# Patient Record
Sex: Female | Born: 1935 | Race: White | Hispanic: No | Smoking: Never smoker
Health system: Southern US, Community
[De-identification: ages and names within clinical notes are randomized; demographics above are authoritative.]

## PROBLEM LIST (undated history)

## (undated) DIAGNOSIS — S329XXA Fracture of unspecified parts of lumbosacral spine and pelvis, initial encounter for closed fracture: Secondary | ICD-10-CM

## (undated) DIAGNOSIS — R600 Localized edema: Secondary | ICD-10-CM

## (undated) DIAGNOSIS — M810 Age-related osteoporosis without current pathological fracture: Secondary | ICD-10-CM

## (undated) DIAGNOSIS — E119 Type 2 diabetes mellitus without complications: Secondary | ICD-10-CM

## (undated) DIAGNOSIS — R7302 Impaired glucose tolerance (oral): Secondary | ICD-10-CM

## (undated) DIAGNOSIS — M1990 Unspecified osteoarthritis, unspecified site: Secondary | ICD-10-CM

## (undated) DIAGNOSIS — R0989 Other specified symptoms and signs involving the circulatory and respiratory systems: Secondary | ICD-10-CM

## (undated) DIAGNOSIS — I5032 Chronic diastolic (congestive) heart failure: Secondary | ICD-10-CM

## (undated) DIAGNOSIS — E7800 Pure hypercholesterolemia, unspecified: Secondary | ICD-10-CM

## (undated) HISTORY — PX: CATARACT EXTRACTION: SUR2

## (undated) HISTORY — DX: Fracture of unspecified parts of lumbosacral spine and pelvis, initial encounter for closed fracture: S32.9XXA

## (undated) HISTORY — DX: Other specified symptoms and signs involving the circulatory and respiratory systems: R09.89

## (undated) HISTORY — PX: TONSILLECTOMY: SHX5217

## (undated) HISTORY — DX: Impaired glucose tolerance (oral): R73.02

## (undated) HISTORY — DX: Type 2 diabetes mellitus without complications: E11.9

## (undated) HISTORY — DX: Localized edema: R60.0

## (undated) HISTORY — PX: ABDOMINAL HYSTERECTOMY: SHX81

## (undated) HISTORY — DX: Unspecified osteoarthritis, unspecified site: M19.90

## (undated) HISTORY — DX: Age-related osteoporosis without current pathological fracture: M81.0

## (undated) HISTORY — DX: Chronic diastolic (congestive) heart failure: I50.32

## (undated) HISTORY — DX: Pure hypercholesterolemia, unspecified: E78.00

---

## 1999-07-03 ENCOUNTER — Ambulatory Visit (HOSPITAL_COMMUNITY): Admission: RE | Admit: 1999-07-03 | Discharge: 1999-07-03 | Payer: Self-pay | Admitting: *Deleted

## 2004-06-05 ENCOUNTER — Encounter: Admission: RE | Admit: 2004-06-05 | Discharge: 2004-06-05 | Payer: Self-pay | Admitting: Internal Medicine

## 2005-06-28 ENCOUNTER — Ambulatory Visit: Payer: Self-pay | Admitting: Internal Medicine

## 2006-06-06 ENCOUNTER — Encounter: Admission: RE | Admit: 2006-06-06 | Discharge: 2006-06-06 | Payer: Self-pay | Admitting: Internal Medicine

## 2006-07-21 ENCOUNTER — Ambulatory Visit: Payer: Self-pay | Admitting: Internal Medicine

## 2007-06-08 ENCOUNTER — Encounter: Admission: RE | Admit: 2007-06-08 | Discharge: 2007-06-08 | Payer: Self-pay | Admitting: Internal Medicine

## 2007-07-14 DIAGNOSIS — M810 Age-related osteoporosis without current pathological fracture: Secondary | ICD-10-CM | POA: Insufficient documentation

## 2007-07-14 HISTORY — DX: Age-related osteoporosis without current pathological fracture: M81.0

## 2007-07-27 ENCOUNTER — Ambulatory Visit: Payer: Self-pay | Admitting: Internal Medicine

## 2007-07-27 DIAGNOSIS — E119 Type 2 diabetes mellitus without complications: Secondary | ICD-10-CM

## 2007-07-27 DIAGNOSIS — E7800 Pure hypercholesterolemia, unspecified: Secondary | ICD-10-CM

## 2007-07-27 HISTORY — DX: Pure hypercholesterolemia, unspecified: E78.00

## 2007-07-27 HISTORY — DX: Type 2 diabetes mellitus without complications: E11.9

## 2008-06-08 ENCOUNTER — Encounter: Admission: RE | Admit: 2008-06-08 | Discharge: 2008-06-08 | Payer: Self-pay | Admitting: Internal Medicine

## 2008-07-27 ENCOUNTER — Encounter: Payer: Self-pay | Admitting: Internal Medicine

## 2008-07-28 ENCOUNTER — Ambulatory Visit: Payer: Self-pay | Admitting: Internal Medicine

## 2008-07-28 DIAGNOSIS — M1990 Unspecified osteoarthritis, unspecified site: Secondary | ICD-10-CM | POA: Insufficient documentation

## 2008-07-28 HISTORY — DX: Unspecified osteoarthritis, unspecified site: M19.90

## 2009-06-09 ENCOUNTER — Encounter: Admission: RE | Admit: 2009-06-09 | Discharge: 2009-06-09 | Payer: Self-pay | Admitting: Internal Medicine

## 2009-08-02 ENCOUNTER — Ambulatory Visit: Payer: Self-pay | Admitting: Internal Medicine

## 2010-08-03 ENCOUNTER — Ambulatory Visit: Payer: Self-pay | Admitting: Internal Medicine

## 2010-08-03 ENCOUNTER — Encounter: Payer: Self-pay | Admitting: Internal Medicine

## 2010-08-03 LAB — CONVERTED CEMR LAB
AST: 26 units/L (ref 0–37)
Albumin: 4.4 g/dL (ref 3.5–5.2)
Alkaline Phosphatase: 66 units/L (ref 39–117)
Basophils Absolute: 0 10*3/uL (ref 0.0–0.1)
Basophils Relative: 0.6 % (ref 0.0–3.0)
CO2: 28 meq/L (ref 19–32)
Calcium: 9.1 mg/dL (ref 8.4–10.5)
Eosinophils Absolute: 0.1 10*3/uL (ref 0.0–0.7)
Glucose, Bld: 111 mg/dL — ABNORMAL HIGH (ref 70–99)
HCT: 36 % (ref 36.0–46.0)
HDL: 80.4 mg/dL (ref >39.00)
Hemoglobin: 12.5 g/dL (ref 12.0–15.0)
Lymphocytes Relative: 25.9 % (ref 12.0–46.0)
Lymphs Abs: 1.4 10*3/uL (ref 0.7–4.0)
MCHC: 34.7 g/dL (ref 30.0–36.0)
Monocytes Relative: 11.9 % (ref 3.0–12.0)
Neutro Abs: 3.3 10*3/uL (ref 1.4–7.7)
Potassium: 5.6 meq/L — ABNORMAL HIGH (ref 3.5–5.1)
RBC: 3.65 M/uL — ABNORMAL LOW (ref 3.87–5.11)
RDW: 14.9 % — ABNORMAL HIGH (ref 11.5–14.6)
Sodium: 132 meq/L — ABNORMAL LOW (ref 135–145)
TSH: 0.61 microintl units/mL (ref 0.35–5.50)
Total CHOL/HDL Ratio: 2
Total Protein: 7 g/dL (ref 6.0–8.3)

## 2010-08-06 ENCOUNTER — Telehealth: Payer: Self-pay | Admitting: Internal Medicine

## 2010-12-02 LAB — CONVERTED CEMR LAB
ALT: 15 units/L (ref 0–35)
AST: 23 units/L (ref 0–37)
AST: 25 units/L (ref 0–37)
Albumin: 4.3 g/dL (ref 3.5–5.2)
Alkaline Phosphatase: 58 units/L (ref 39–117)
BUN: 10 mg/dL (ref 6–23)
Basophils Absolute: 0 10*3/uL (ref 0.0–0.1)
Basophils Relative: 0.6 % (ref 0.0–3.0)
Basophils Relative: 0.7 % (ref 0.0–1.0)
Bilirubin Urine: NEGATIVE
CO2: 30 meq/L (ref 19–32)
CO2: 32 meq/L (ref 19–32)
Calcium: 9.2 mg/dL (ref 8.4–10.5)
Chloride: 102 meq/L (ref 96–112)
Chloride: 103 meq/L (ref 96–112)
Cholesterol: 179 mg/dL (ref 0–200)
Creatinine, Ser: 0.7 mg/dL (ref 0.4–1.2)
Creatinine, Ser: 0.8 mg/dL (ref 0.4–1.2)
Eosinophils Relative: 3.6 % (ref 0.0–5.0)
Glucose, Bld: 106 mg/dL — ABNORMAL HIGH (ref 70–99)
Glucose, Urine, Semiquant: NEGATIVE
HCT: 37.5 % (ref 36.0–46.0)
Hemoglobin: 12.1 g/dL (ref 12.0–15.0)
Hemoglobin: 13 g/dL (ref 12.0–15.0)
Hgb A1c MFr Bld: 6 % (ref 4.6–6.0)
Ketones, urine, test strip: NEGATIVE
LDL Cholesterol: 100 mg/dL — ABNORMAL HIGH (ref 0–99)
Lymphocytes Relative: 27.4 % (ref 12.0–46.0)
MCHC: 35.3 g/dL (ref 30.0–36.0)
Monocytes Absolute: 0.7 10*3/uL (ref 0.2–0.7)
Monocytes Relative: 12 % (ref 3.0–12.0)
Neutro Abs: 3.1 10*3/uL (ref 1.4–7.7)
Neutrophils Relative %: 56.4 % (ref 43.0–77.0)
Neutrophils Relative %: 61.1 % (ref 43.0–77.0)
Potassium: 4.4 meq/L (ref 3.5–5.1)
RBC: 3.46 M/uL — ABNORMAL LOW (ref 3.87–5.11)
RDW: 13.2 % (ref 11.5–14.6)
Sodium: 139 meq/L (ref 135–145)
Specific Gravity, Urine: 1.015
TSH: 0.38 microintl units/mL (ref 0.35–5.50)
Total Bilirubin: 0.8 mg/dL (ref 0.3–1.2)
Total Protein: 7 g/dL (ref 6.0–8.3)
Total Protein: 7 g/dL (ref 6.0–8.3)
WBC: 5.4 10*3/uL (ref 4.5–10.5)
WBC: 5.6 10*3/uL (ref 4.5–10.5)
pH: 7

## 2010-12-04 NOTE — Assessment & Plan Note (Signed)
Summary: emp--will fast//ccm   Vital Signs:  Patient profile:   77 year old female Height:      60 inches Weight:      138 pounds BMI:     27.05 Temp:     97.7 degrees F oral BP sitting:   120 / 70  (right arm) Cuff size:   regular  Vitals Entered By: Duard Brady LPN (August 03, 2010 8:29 AM) CC: cpx - doing well    **decline Flu Vaccine Is Patient Diabetic? No   CC:  cpx - doing well    **decline Flu Vaccine.  History of Present Illness: 77  year old patient who is seen today for a health maintenance examination.  She does remarkably well with a history of osteoporosis.  She has a history of impaired glucose tolerance.  Mild dyslipidemia, and osteoarthritis.  Here for Medicare AWV:  1.   Risk factors based on Past M, S, F history:  risk factors include a history of mild dyslipidemia, as well as her age of 77 years old.  She denies any cardiopulmonary complaints and does walk daily 2.   Physical Activities: daily exercise 3.   Depression/mood: no history of depression or mood disorder 4.   Hearing: no significant deficits 5.   ADL's: independent in all aspects of daily living; still drives locally and lives independently 6.   Fall Risk: moderate due to  her age.  She has had no falls, however 7.   Home Safety: no problems identified.  Does have a daughter, who lives by and watches over her care. 8.   Height, weight, &visual acuity:height and weight are stable.  No difficulty with visual acuity 9.   Counseling: heart healthy diet, and ongoing walking regimen encouraged 10.   Labs ordered based on risk factors: laboratory profile, including lipid panel will be reviewed.  A TSH will be reviewed 11.           Referral Coordination- not appropriate at this time 12.           Care Plan- heart healthy diet;  walking regimen encouraged 13.            Cognitive Assessment-  alert and oriented, with normal affect.  No memory deficits; handles executive functions without  difficulty   Allergies (verified): No Known Drug Allergies  Past History:  Past Medical History: Reviewed history from 07/28/2008 and no changes required. DJD High Cholesterol Osteoporosis Menopausal Syndrome  impaired glucose tolerance bilateral carotid bruits  Past Surgical History: Reviewed history from 08/02/2009 and no changes required. Dental Extraction Cataract extraction Hysterectomy Tonsillectomy gravida 5, para 4, abortus one   screening colonoscopy-none  Family History: Reviewed history from 07/28/2008 and no changes required. Family History of CAD Female 1st degree relative <50 Family History Kidney disease Family History of Prostate CA 1st degree relative <50 Family History of Suicide attempt Family History of Leukemia  father died age 47, renal cancer-history of CAD mother died age 71  Four  brothers-positive  for leukemia and prostate cancer  Social History: Reviewed history from 07/28/2008 and no changes required. Retired Single lives alone.  Three adult children in the vicinity Never Smoked Widow/Widower since 1997  Review of Systems  The patient denies anorexia, fever, weight loss, weight gain, vision loss, decreased hearing, hoarseness, chest pain, syncope, dyspnea on exertion, peripheral edema, prolonged cough, headaches, hemoptysis, abdominal pain, melena, hematochezia, severe indigestion/heartburn, hematuria, incontinence, genital sores, muscle weakness, suspicious skin lesions, transient blindness, difficulty walking,  depression, unusual weight change, abnormal bleeding, enlarged lymph nodes, angioedema, and breast masses.    Physical Exam  General:  elderly alert, no distress Head:  Normocephalic and atraumatic without obvious abnormalities. No apparent alopecia or balding. Eyes:  No corneal or conjunctival inflammation noted. EOMI. Perrla. Funduscopic exam benign, without hemorrhages, exudates or papilledema. Vision grossly normal. Ears:   External ear exam shows no significant lesions or deformities.  Otoscopic examination reveals clear canals, tympanic membranes are intact bilaterally without bulging, retraction, inflammation or discharge. Hearing is grossly normal bilaterally. Nose:  External nasal examination shows no deformity or inflammation. Nasal mucosa are pink and moist without lesions or exudates. Mouth:  Oral mucosa and oropharynx without lesions or exudates.  dentures in place. Neck:  no audible bruits noted today Chest Wall:  No deformities, masses, or tenderness noted. Breasts:  No mass, nodules, thickening, tenderness, bulging, retraction, inflamation, nipple discharge or skin changes noted.   Lungs:  Normal respiratory effort, chest expands symmetrically. Lungs are clear to auscultation, no crackles or wheezes. Heart:  Normal rate and regular rhythm. S1 and S2 normal without gallop, murmur, click, rub or other extra sounds. Abdomen:  Bowel sounds positive,abdomen soft and non-tender without masses, organomegaly or hernias noted.  abdominal and bilateral femoral bruits noted Rectal:  No external abnormalities noted. Normal sphincter tone. No rectal masses or tenderness. Genitalia:  status post hysterectomy atrophic changes only Msk:  No deformity or scoliosis noted of thoracic or lumbar spine.   Pulses:  dorsalis pedis pulses, full  posterior tibial pulse is not easily palpable Extremities:  No clubbing, cyanosis, edema, or deformity noted with normal full range of motion of all joints.   Neurologic:  No cranial nerve deficits noted. Station and gait are normal. Plantar reflexes are down-going bilaterally. DTRs are symmetrical throughout. Sensory, motor and coordinative functions appear intact. Skin:  Intact without suspicious lesions or rashes Cervical Nodes:  No lymphadenopathy noted Axillary Nodes:  No palpable lymphadenopathy Inguinal Nodes:  No significant adenopathy Psych:  Cognition and judgment appear  intact. Alert and cooperative with normal attention span and concentration. No apparent delusions, illusions, hallucinations  Diabetes Management Exam:    Foot Exam (with socks and/or shoes not present):       Sensory-Pinprick/Light touch:          Left medial foot (L-4): normal          Left dorsal foot (L-5): normal          Left lateral foot (S-1): normal          Right medial foot (L-4): normal          Right dorsal foot (L-5): normal          Right lateral foot (S-1): normal       Sensory-Monofilament:          Left foot: normal          Right foot: normal       Inspection:          Left foot: normal          Right foot: normal       Nails:          Left foot: fungal infection          Right foot: fungal infection    Foot Exam by Podiatrist:       Date: 08/03/2010       Results: no diabetic findings       Done by:  PCP    Eye Exam:       Eye Exam done here today          Results: normal   Impression & Recommendations:  Problem # 1:  PREVENTIVE HEALTH CARE (ICD-V70.0)  Orders: EKG w/ Interpretation (93000) Medicare -1st Annual Wellness Visit [phone])  Complete Medication List: 1)  Fosamax 70 Mg Tabs (Alendronate sodium) .Marland Kitchen.. 1 q week 2)  Adult Aspirin Ec Low Strength 81 Mg Tbec (Aspirin) .Marland Kitchen.. 1 once daily 3)  Multivitamins Tabs (Multiple vitamin) .... Qd  Other Orders: Venipuncture (60737) TLB-Lipid Panel (80061-LIPID) TLB-BMP (Basic Metabolic Panel-BMET) (80048-METABOL) TLB-CBC Platelet - w/Differential (85025-CBCD) TLB-Hepatic/Liver Function Pnl (80076-HEPATIC) TLB-TSH (Thyroid Stimulating Hormone) (84443-TSH) TLB-A1C / Hgb A1C (Glycohemoglobin) (83036-A1C) Specimen Handling (10626)  Patient Instructions: 1)  Please schedule a follow-up appointment in 1 year. 2)  Limit your Sodium (Salt). 3)  It is important that you exercise regularly at least 20 minutes 5 times a week. If you develop chest pain, have severe difficulty breathing, or feel very tired , stop  exercising immediately and seek medical attention. 4)  Take calcium +Vitamin D daily. Prescriptions: FOSAMAX 70 MG  TABS (ALENDRONATE SODIUM) 1 q week  #4 x 6   Entered and Authorized by:   Gordy Savers  MD   Signed by:   Gordy Savers  MD on 08/03/2010   Method used:   Print then Give to Patient   RxID:   [id number]

## 2010-12-04 NOTE — Progress Notes (Signed)
Summary: pt fell/black out on 08-03-2010  Phone Note Call from Patient Call back at Home Phone [phone]   Caller: Daughter-carolyn Call For: Gordy Savers  MD Summary of Call: Daughter stated mom felll Vedia Coffer out in dr k office 08-03-2010.Pt has broken wrist also needs bloodwork results Initial call taken by: Heron Sabins,  August 06, 2010 10:51 AM  Follow-up for Phone Call        called Follow-up by: Gordy Savers  MD,  August 06, 2010 12:47 PM    .

## 2011-03-22 NOTE — Assessment & Plan Note (Signed)
[facility]                              [facility] OFFICE NOTE   NAME:Stephanie Jenkins, Stephanie Jenkins                       MRN:          [id number]  DATE:07/21/2006                            DOB:          [birth year]    An 77 year old female seen today for comprehensive exam.   HISTORY:  Osteoporosis, mild DJD, mild hypercholesterolemia.  Also a history  of mild impaired glucose tolerance.  She has done quite well today.  She  takes Fosamax, calcium, and vitamin D supplementation.  She has had a remote  hysterectomy, tonsillectomy.  She is gravida 5, para 4, abortus 1.  She had  cataract surgery and dental extractions.  Otherwise, no surgeries.   FAMILY HISTORY:  Unchanged.  Father died of renal cancer at 54.  Mother died  at 104.  Three brothers.  Positive for leukemia and prostate cancer.   PHYSICAL EXAM:  GENERAL:  Elderly white female in no acute distress.  VITAL SIGNS:  Blood pressure was 120/76.  HEENT:  Fundi, ear, nose, and throat unremarkable.  Dentures in place.  NECK:  Bilateral soft bruits.  CHEST:  Clear.  BREASTS:  Negative.  CARDIOVASCULAR:  Normal heart sounds.  No murmurs.  ABDOMEN:  Benign.  She had some bilateral soft femoral bruits.  PELVIC:  Exam revealed absent uterus.  No adnexal masses.  EXTREMITIES:  No edema.  Dorsalis pedis pulses were full.  Dorsalis pedis  pulses were not easily palpable.  NEURO:  Negative.   IMPRESSION:  1. Osteoporosis.  2. Menopausal syndrome.  3. History of impaired glucose tolerance.   DISPOSITION:  Laboratory update will be reviewed.  Will reassess in 1 year.                                   Gordy Savers, MD   PFK/MedQ  DD:  07/21/2006  DT:  07/21/2006  Job #:  [id number]

## 2011-08-02 ENCOUNTER — Encounter: Payer: Self-pay | Admitting: Internal Medicine

## 2011-08-07 ENCOUNTER — Encounter: Payer: Self-pay | Admitting: Internal Medicine

## 2011-08-07 ENCOUNTER — Ambulatory Visit (INDEPENDENT_AMBULATORY_CARE_PROVIDER_SITE_OTHER): Payer: Self-pay | Admitting: Internal Medicine

## 2011-08-07 DIAGNOSIS — Z0000 Encounter for general adult medical examination without abnormal findings: Secondary | ICD-10-CM

## 2011-08-07 DIAGNOSIS — E7800 Pure hypercholesterolemia, unspecified: Secondary | ICD-10-CM

## 2011-08-07 DIAGNOSIS — M1990 Unspecified osteoarthritis, unspecified site: Secondary | ICD-10-CM

## 2011-08-07 DIAGNOSIS — E119 Type 2 diabetes mellitus without complications: Secondary | ICD-10-CM

## 2011-08-07 DIAGNOSIS — M810 Age-related osteoporosis without current pathological fracture: Secondary | ICD-10-CM

## 2011-08-07 LAB — COMPREHENSIVE METABOLIC PANEL
ALT: 16 U/L (ref 0–35)
AST: 25 U/L (ref 0–37)
Alkaline Phosphatase: 71 U/L (ref 39–117)
BUN: 9 mg/dL (ref 6–23)
Calcium: 9 mg/dL (ref 8.4–10.5)
Creatinine, Ser: 0.7 mg/dL (ref 0.4–1.2)
Total Bilirubin: 0.5 mg/dL (ref 0.3–1.2)

## 2011-08-07 LAB — LIPID PANEL
Cholesterol: 180 mg/dL (ref 0–200)
HDL: 71.5 mg/dL (ref >39.00)
LDL Cholesterol: 97 mg/dL (ref 0–99)
Total CHOL/HDL Ratio: 3
Triglycerides: 60 mg/dL (ref 0.0–149.0)
VLDL: 12 mg/dL (ref 0.0–40.0)

## 2011-08-07 LAB — CBC WITH DIFFERENTIAL/PLATELET
Basophils Relative: 0.6 % (ref 0.0–3.0)
Eosinophils Relative: 1.4 % (ref 0.0–5.0)
HCT: 38.2 % (ref 36.0–46.0)
Hemoglobin: 12.7 g/dL (ref 12.0–15.0)
MCV: 100.6 fl — ABNORMAL HIGH (ref 78.0–100.0)
Monocytes Absolute: 0.7 10*3/uL (ref 0.1–1.0)
Neutro Abs: 3.1 10*3/uL (ref 1.4–7.7)
Neutrophils Relative %: 55.7 % (ref 43.0–77.0)
RBC: 3.79 Mil/uL — ABNORMAL LOW (ref 3.87–5.11)
WBC: 5.5 10*3/uL (ref 4.5–10.5)

## 2011-08-07 NOTE — Progress Notes (Signed)
Subjective:    Patient ID: Stephanie Jenkins, female    DOB: October 11, 1923, 77 y.o.   MRN: [id number]  HPI  History of Present Illness:   77year old patient who is seen today for a health maintenance examination. She does remarkably well with a history of osteoporosis. She has a history of impaired glucose tolerance. Mild dyslipidemia, and osteoarthritis.   Here for Medicare AWV:   1. Risk factors based on Past M, S, F history: risk factors include a history of mild dyslipidemia, as well as her age of 77 years old. She denies any cardiopulmonary complaints and does walk daily  2. Physical Activities: daily exercise  3. Depression/mood: no history of depression or mood disorder  4. Hearing: no significant deficits  5. ADL's: independent in all aspects of daily living; still drives locally and lives independently  6. Fall Risk: moderate due to her age. She has had no falls, however  7. Home Safety: no problems identified. Does have a daughter, who lives by and watches over her care.  8. Height, weight, &visual acuity:height and weight are stable. No difficulty with visual acuity  9. Counseling: heart healthy diet, and ongoing walking regimen encouraged  10. Labs ordered based on risk factors: laboratory profile, including lipid panel will be reviewed. A TSH will be reviewed  11. Referral Coordination- not appropriate at this time  12. Care Plan- heart healthy diet; walking regimen encouraged  13. Cognitive Assessment- alert and oriented, with normal affect. No memory deficits; handles executive functions without difficulty   Allergies (verified):  No Known Drug Allergies   Past History:  Past Medical History:  Reviewed history from 07/28/2008 and no changes required.  DJD  High Cholesterol  Osteoporosis  Menopausal Syndrome  impaired glucose tolerance  bilateral carotid bruits   Past Surgical History:  Reviewed history from 08/02/2009 and no changes required.  Dental Extraction    Cataract extraction  Hysterectomy  Tonsillectomy  gravida 5, para 4, abortus one  screening colonoscopy-none   Family History:  Reviewed history from 07/28/2008 and no changes required.  Family History of CAD Female 1st degree relative <50  Family History Kidney disease  Family History of Prostate CA 1st degree relative <50  Family History of Suicide attempt  Family History of Leukemia  father died age 68, renal cancer-history of CAD  mother died age 38  Four brothers-positive for leukemia and prostate cancer   Social History:  Reviewed history from 07/28/2008 and no changes required.  Retired  Single lives alone. Three adult children in the vicinity  Never Smoked  Widow/Widower since 1997    Review of Systems  Constitutional: Negative for fever, appetite change, fatigue and unexpected weight change.  HENT: Negative for hearing loss, ear pain, nosebleeds, congestion, sore throat, mouth sores, trouble swallowing, neck stiffness, dental problem, voice change, sinus pressure and tinnitus.   Eyes: Negative for photophobia, pain, redness and visual disturbance.  Respiratory: Negative for cough, chest tightness and shortness of breath.   Cardiovascular: Negative for chest pain, palpitations and leg swelling.  Gastrointestinal: Negative for nausea, vomiting, abdominal pain, diarrhea, constipation, blood in stool, abdominal distention and rectal pain.  Genitourinary: Negative for dysuria, urgency, frequency, hematuria, flank pain, vaginal bleeding, vaginal discharge, difficulty urinating, genital sores, vaginal pain, menstrual problem and pelvic pain.  Musculoskeletal: Negative for back pain and arthralgias.  Skin: Negative for rash.  Neurological: Negative for dizziness, syncope, speech difficulty, weakness, light-headedness, numbness and headaches.  Hematological: Negative for adenopathy. Does not bruise/bleed  easily.  Psychiatric/Behavioral: Negative for suicidal ideas, behavioral  problems, self-injury, dysphoric mood and agitation. The patient is not nervous/anxious.        Objective:   Physical Exam  Constitutional: She is oriented to person, place, and time. She appears well-developed and well-nourished.  HENT:  Head: Normocephalic and atraumatic.  Right Ear: External ear normal.  Left Ear: External ear normal.  Mouth/Throat: Oropharynx is clear and moist.  Eyes: Conjunctivae and EOM are normal.  Neck: Normal range of motion. Neck supple. No JVD present. No thyromegaly present.       Faint  carotid bruits  Cardiovascular: Normal rate, regular rhythm, normal heart sounds and intact distal pulses.   No murmur heard.      Bilateral femoral bruits. Dorsalis pedis pulses full. Posterior tibial pulses not easily palpable  Pulmonary/Chest: Effort normal and breath sounds normal. She has no wheezes. She has no rales.  Abdominal: Soft. Bowel sounds are normal. She exhibits no distension and no mass. There is no tenderness. There is no rebound and no guarding.  Genitourinary: Guaiac negative stool.  Musculoskeletal: Normal range of motion. She exhibits no edema and no tenderness.  Neurological: She is alert and oriented to person, place, and time. She has normal reflexes. No cranial nerve deficit. She exhibits normal muscle tone. Coordination normal.  Skin: Skin is warm and dry. No rash noted.  Psychiatric: She has a normal mood and affect. Her behavior is normal.          Assessment & Plan:   Preventive health examination Osteoporosis. We'll discontinue Fosamax she has been on this a number of years. We'll continue calcium and vitamin D supplementation Impaired glucose tolerance. We'll check lab including a hemoglobin A1c Dyslipidemia   We'll recheck one year or as needed A flu vaccine recommended but declined

## 2011-08-07 NOTE — Patient Instructions (Signed)
Take a calcium supplement, plus 800-1200 units of vitamin D    It is important that you exercise regularly, at least 20 minutes 3 to 4 times per week.  If you develop chest pain or shortness of breath seek  medical attention.  Return in one year for follow-up   

## 2011-08-12 ENCOUNTER — Encounter: Payer: Self-pay | Admitting: Internal Medicine

## 2012-08-07 ENCOUNTER — Ambulatory Visit (INDEPENDENT_AMBULATORY_CARE_PROVIDER_SITE_OTHER): Payer: Medicare Other | Admitting: Internal Medicine

## 2012-08-07 ENCOUNTER — Encounter: Payer: Self-pay | Admitting: Internal Medicine

## 2012-08-07 VITALS — BP 108/70 | HR 82 | Temp 98.2°F | Resp 16 | Ht 60.0 in | Wt 137.0 lb

## 2012-08-07 DIAGNOSIS — M1990 Unspecified osteoarthritis, unspecified site: Secondary | ICD-10-CM

## 2012-08-07 DIAGNOSIS — M810 Age-related osteoporosis without current pathological fracture: Secondary | ICD-10-CM

## 2012-08-07 DIAGNOSIS — R7302 Impaired glucose tolerance (oral): Secondary | ICD-10-CM

## 2012-08-07 DIAGNOSIS — Z0000 Encounter for general adult medical examination without abnormal findings: Secondary | ICD-10-CM

## 2012-08-07 DIAGNOSIS — R7309 Other abnormal glucose: Secondary | ICD-10-CM

## 2012-08-07 DIAGNOSIS — Z79899 Other long term (current) drug therapy: Secondary | ICD-10-CM

## 2012-08-07 HISTORY — DX: Impaired glucose tolerance (oral): R73.02

## 2012-08-07 LAB — COMPREHENSIVE METABOLIC PANEL
Albumin: 4.2 g/dL (ref 3.5–5.2)
BUN: 10 mg/dL (ref 6–23)
CO2: 28 mEq/L (ref 19–32)
GFR: 78.77 mL/min (ref >60.00)
Glucose, Bld: 118 mg/dL — ABNORMAL HIGH (ref 70–99)
Sodium: 135 mEq/L (ref 135–145)
Total Bilirubin: 0.5 mg/dL (ref 0.3–1.2)
Total Protein: 7 g/dL (ref 6.0–8.3)

## 2012-08-07 LAB — CBC WITH DIFFERENTIAL/PLATELET
Eosinophils Relative: 1.1 % (ref 0.0–5.0)
HCT: 36 % (ref 36.0–46.0)
Monocytes Relative: 13.2 % — ABNORMAL HIGH (ref 3.0–12.0)
Neutrophils Relative %: 61.1 % (ref 43.0–77.0)
Platelets: 242 10*3/uL (ref 150.0–400.0)
WBC: 5.1 10*3/uL (ref 4.5–10.5)

## 2012-08-07 NOTE — Progress Notes (Signed)
Subjective:    Patient ID: Stephanie Jenkins, female    DOB: 24-Mar-1923, 78 y.o.   MRN: [id number]  HPI    Review of Systems     Objective:   Physical Exam        Assessment & Plan:   Subjective:    Patient ID: Stephanie Jenkins, female    DOB: 1923/06/19, 78 y.o.   MRN: [id number]  HPI  History of Present Illness:   78 year old patient who is seen today for a health maintenance examination. She does remarkably well with a history of osteoporosis. She has a history of impaired glucose tolerance. Mild dyslipidemia, and osteoarthritis.   Here for Medicare AWV:   1. Risk factors based on Past M, S, F history: risk factors include a history of mild dyslipidemia, as well as her age of 78  years old. She denies any cardiopulmonary complaints and does walk daily  2. Physical Activities: daily exercise  3. Depression/mood: no history of depression or mood disorder  4. Hearing: no significant deficits  5. ADL's: independent in all aspects of daily living; still drives locally and lives independently  6. Fall Risk: moderate due to her age. She has had no falls, however  7. Home Safety: no problems identified. Does have a daughter, who lives by and watches over her care.  8. Height, weight, &visual acuity:height and weight are stable. No difficulty with visual acuity  9. Counseling: heart healthy diet, and ongoing walking regimen encouraged  10. Labs ordered based on risk factors: laboratory profile, including lipid panel will be reviewed. A TSH will be reviewed  11. Referral Coordination- not appropriate at this time  12. Care Plan- heart healthy diet; walking regimen encouraged  13. Cognitive Assessment- alert and oriented, with normal affect. No memory deficits; handles executive functions without difficulty   Allergies (verified):  No Known Drug Allergies   Past History:  Past Medical History:  Reviewed history from 07/28/2008 and no changes required.  DJD  High Cholesterol    Osteoporosis  Menopausal Syndrome  impaired glucose tolerance  bilateral carotid bruits   Past Surgical History:  Reviewed history from 08/02/2009 and no changes required.  Dental Extraction  Cataract extraction  Hysterectomy  Tonsillectomy  gravida 5, para 4, abortus one  screening colonoscopy-none   Family History:  Reviewed history from 07/28/2008 and no changes required.  Family History of CAD Female 1st degree relative <50  Family History Kidney disease  Family History of Prostate CA 1st degree relative <50  Family History of Suicide attempt  Family History of Leukemia  father died age 27, renal cancer-history of CAD  mother died age 54  Four brothers-positive for leukemia and prostate cancer   Social History:  Reviewed history from 07/28/2008 and no changes required.  Retired  Single lives alone. Three adult children in the vicinity  Never Smoked  Widow/Widower since 1997    Review of Systems  Constitutional: Negative for fever, appetite change, fatigue and unexpected weight change.  HENT: Negative for hearing loss, ear pain, nosebleeds, congestion, sore throat, mouth sores, trouble swallowing, neck stiffness, dental problem, voice change, sinus pressure and tinnitus.   Eyes: Negative for photophobia, pain, redness and visual disturbance.  Respiratory: Negative for cough, chest tightness and shortness of breath.   Cardiovascular: Negative for chest pain, palpitations and leg swelling.  Gastrointestinal: Negative for nausea, vomiting, abdominal pain, diarrhea, constipation, blood in stool, abdominal distention and rectal pain.  Genitourinary: Negative for dysuria, urgency, frequency,  hematuria, flank pain, vaginal bleeding, vaginal discharge, difficulty urinating, genital sores, vaginal pain, menstrual problem and pelvic pain.  Musculoskeletal: Negative for back pain and arthralgias.  Skin: Negative for rash.  Neurological: Negative for dizziness, syncope, speech  difficulty, weakness, light-headedness, numbness and headaches.  Hematological: Negative for adenopathy. Does not bruise/bleed easily.  Psychiatric/Behavioral: Negative for suicidal ideas, behavioral problems, self-injury, dysphoric mood and agitation. The patient is not nervous/anxious.     Past Medical History  Diagnosis Date  . DEGENERATIVE JOINT DISEASE 07/28/2008  . DIABETES MELLITUS, TYPE II, CONTROLLED 07/27/2007  . HYPERCHOLESTEROLEMIA, MILD 07/27/2007  . OSTEOPOROSIS 07/14/2007  . Carotid bruit     bilateral    History   Social History  . Marital Status: Widowed    Spouse Name: N/A    Number of Children: N/A  . Years of Education: N/A   Occupational History  . Not on file.   Social History Main Topics  . Smoking status: Never Smoker   . Smokeless tobacco: Never Used  . Alcohol Use: No  . Drug Use: No  . Sexually Active: Not on file   Other Topics Concern  . Not on file   Social History Narrative  . No narrative on file    Past Surgical History  Procedure Date  . Cataract extraction   . Abdominal hysterectomy   . Tonsillectomy     No family history on file.  No Known Allergies  Current Outpatient Prescriptions on File Prior to Visit  Medication Sig Dispense Refill  . alendronate (FOSAMAX) 70 MG tablet Take 70 mg by mouth every 7 (seven) days.       Marland Kitchen aspirin 81 MG tablet Take 81 mg by mouth daily.        . Multiple Vitamin (MULTIVITAMIN) tablet Take 1 tablet by mouth daily.          BP 108/70  Pulse 82  Temp 98.2 F (36.8 C) (Oral)  Resp 16  Ht 5' (1.524 m)  Wt 137 lb (62.143 kg)  BMI 26.76 kg/m2  SpO2 96%        Objective:   Physical Exam  Constitutional: She is oriented to person, place, and time. She appears well-developed and well-nourished.  HENT:  Head: Normocephalic and atraumatic.  Right Ear: External ear normal.  Left Ear: External ear normal.  Mouth/Throat: Oropharynx is clear and moist.  Eyes: Conjunctivae and EOM are  normal.  Neck: Normal range of motion. Neck supple. No JVD present. No thyromegaly present.       Faint  carotid bruits  Cardiovascular: Normal rate, regular rhythm, normal heart sounds and intact distal pulses.   No murmur heard.      Bilateral femoral bruits. Dorsalis pedis pulses full. Posterior tibial pulses not easily palpable  Pulmonary/Chest: Effort normal and breath sounds normal. She has no wheezes. She has no rales.  Abdominal: Soft. Bowel sounds are normal. She exhibits no distension and no mass. There is no tenderness. There is no rebound and no guarding.  Genitourinary: Guaiac negative stool.  Musculoskeletal: Normal range of motion. She exhibits no edema and no tenderness.  Neurological: She is alert and oriented to person, place, and time. She has normal reflexes. No cranial nerve deficit. She exhibits normal muscle tone. Coordination normal.  Skin: Skin is warm and dry. No rash noted.  Psychiatric: She has a normal mood and affect. Her behavior is normal.          Assessment & Plan:   Preventive health  examination Osteoporosis.  We'll continue calcium and vitamin D supplementation Impaired glucose tolerance. We'll check lab including a hemoglobin A1c Dyslipidemia-  lipids profile has been unremarkable low risk   We'll recheck one year or as needed A flu vaccine recommended but declined

## 2012-08-07 NOTE — Patient Instructions (Signed)
Take a calcium supplement, plus [phone] units of vitamin D  Return in one year for follow-up

## 2013-08-09 ENCOUNTER — Ambulatory Visit (INDEPENDENT_AMBULATORY_CARE_PROVIDER_SITE_OTHER): Payer: Medicare Other | Admitting: Internal Medicine

## 2013-08-09 ENCOUNTER — Encounter: Payer: Self-pay | Admitting: Internal Medicine

## 2013-08-09 VITALS — BP 120/70 | HR 76 | Temp 97.9°F | Resp 20 | Ht 59.25 in | Wt 137.0 lb

## 2013-08-09 DIAGNOSIS — M810 Age-related osteoporosis without current pathological fracture: Secondary | ICD-10-CM

## 2013-08-09 DIAGNOSIS — M1990 Unspecified osteoarthritis, unspecified site: Secondary | ICD-10-CM

## 2013-08-09 DIAGNOSIS — Z0000 Encounter for general adult medical examination without abnormal findings: Secondary | ICD-10-CM

## 2013-08-09 DIAGNOSIS — R7302 Impaired glucose tolerance (oral): Secondary | ICD-10-CM

## 2013-08-09 DIAGNOSIS — E7800 Pure hypercholesterolemia, unspecified: Secondary | ICD-10-CM

## 2013-08-09 LAB — COMPREHENSIVE METABOLIC PANEL
ALT: 17 U/L (ref 0–35)
CO2: 26 mEq/L (ref 19–32)
Calcium: 9.3 mg/dL (ref 8.4–10.5)
Chloride: 98 mEq/L (ref 96–112)
GFR: 88.14 mL/min (ref >60.00)
Sodium: 134 mEq/L — ABNORMAL LOW (ref 135–145)
Total Protein: 7.3 g/dL (ref 6.0–8.3)

## 2013-08-09 LAB — CBC WITH DIFFERENTIAL/PLATELET
Basophils Absolute: 0 10*3/uL (ref 0.0–0.1)
HCT: 36.6 % (ref 36.0–46.0)
Lymphocytes Relative: 23.2 % (ref 12.0–46.0)
Lymphs Abs: 1.3 10*3/uL (ref 0.7–4.0)
Monocytes Relative: 10.2 % (ref 3.0–12.0)
Platelets: 262 10*3/uL (ref 150.0–400.0)
RDW: 14.4 % (ref 11.5–14.6)

## 2013-08-09 LAB — TSH: TSH: 0.79 u[IU]/mL (ref 0.35–5.50)

## 2013-08-09 NOTE — Progress Notes (Signed)
Patient ID: Stephanie Jenkins, female   DOB: 02-26-1923, 79 y.o.   MRN: [id number]  Subjective:    Patient ID: Stephanie Jenkins, female    DOB: 10/10/1923, 79 y.o.   MRN: [id number]  HPI see above    Review of Systems    see above Objective:   Physical Exam  Neck:  No carotid bruits noted today  Neurological:  Decreased vibratory sensation distally    See above      Assessment & Plan:   Subjective:    Patient ID: Stephanie Jenkins, female    DOB: 04/09/1923, 79 y.o.   MRN: [id number]  HPI  History of Present Illness:   79  year old patient who is seen today for a health maintenance examination. She does remarkably well with a history of osteoporosis. She has a history of impaired glucose tolerance. Mild dyslipidemia, and osteoarthritis.  She takes daily baby aspirin multivitamins only. She has been treated with Fosamax in the past. She lives independently and even does her own yard work  Here for [employer] AWV:   1. Risk factors based on Past M, S, F history: risk factors include a history of mild dyslipidemia, as well as her age of 79  years old. She denies any cardiopulmonary complaints and does walk daily  2. Physical Activities: daily exercise  3. Depression/mood: no history of depression or mood disorder  4. Hearing: no significant deficits  5. ADL's: independent in all aspects of daily living; still drives locally and lives independently  6. Fall Risk: moderate due to her age. She has had no falls, however  7. Home Safety: no problems identified. Does have a daughter, who lives by and watches over her care.  8. Height, weight, &visual acuity:height and weight are stable. No difficulty with visual acuity  9. Counseling: heart healthy diet, and ongoing walking regimen encouraged  10. Labs ordered based on risk factors: laboratory profile, including lipid panel will be reviewed. A TSH will be reviewed  11. Referral Coordination- not appropriate at this time  12. Care Plan- heart  healthy diet; walking regimen encouraged  13. Cognitive Assessment- alert and oriented, with normal affect. No memory deficits; handles executive functions without difficulty   Allergies (verified):  No Known Drug Allergies   Past History:  Past Medical History:  Reviewed history from 07/28/2008 and no changes required.  DJD  High Cholesterol  Osteoporosis  Menopausal Syndrome  impaired glucose tolerance  bilateral carotid bruits   Past Surgical History:  Reviewed history from 08/02/2009 and no changes required.  Dental Extraction  Cataract extraction  Hysterectomy  Tonsillectomy  gravida 5, para 4, abortus one  screening colonoscopy-none   Family History:  Reviewed history from 07/28/2008 and no changes required.  Family History of CAD Female 1st degree relative <50  Family History Kidney disease  Family History of Prostate CA 1st degree relative <50  Family History of Suicide attempt  Family History of Leukemia  father died age 6, renal cancer-history of CAD  mother died age 25  Four brothers-positive for leukemia and prostate cancer   Social History:  Reviewed history from 07/28/2008 and no changes required.  Retired  Single lives alone. Three adult children in the vicinity  Never Smoked  Widow/Widower since 1997    Review of Systems  Constitutional: Negative for fever, appetite change, fatigue and unexpected weight change.  HENT: Negative for hearing loss, ear pain, nosebleeds, congestion, sore throat, mouth sores, trouble swallowing, neck stiffness, dental problem,  voice change, sinus pressure and tinnitus.   Eyes: Negative for photophobia, pain, redness and visual disturbance.  Respiratory: Negative for cough, chest tightness and shortness of breath.   Cardiovascular: Negative for chest pain, palpitations and leg swelling.  Gastrointestinal: Negative for nausea, vomiting, abdominal pain, diarrhea, constipation, blood in stool, abdominal distention and rectal  pain.  Genitourinary: Negative for dysuria, urgency, frequency, hematuria, flank pain, vaginal bleeding, vaginal discharge, difficulty urinating, genital sores, vaginal pain, menstrual problem and pelvic pain.  Musculoskeletal: Negative for back pain and arthralgias.  Skin: Negative for rash.  Neurological: Negative for dizziness, syncope, speech difficulty, weakness, light-headedness, numbness and headaches.  Hematological: Negative for adenopathy. Does not bruise/bleed easily.  Psychiatric/Behavioral: Negative for suicidal ideas, behavioral problems, self-injury, dysphoric mood and agitation. The patient is not nervous/anxious.     Past Medical History  Diagnosis Date  . DEGENERATIVE JOINT DISEASE 07/28/2008  . DIABETES MELLITUS, TYPE II, CONTROLLED 07/27/2007  . HYPERCHOLESTEROLEMIA, MILD 07/27/2007  . OSTEOPOROSIS 07/14/2007  . Carotid bruit     bilateral    History   Social History  . Marital Status: Widowed    Spouse Name: N/A    Number of Children: N/A  . Years of Education: N/A   Occupational History  . Not on file.   Social History Main Topics  . Smoking status: Never Smoker   . Smokeless tobacco: Never Used  . Alcohol Use: No  . Drug Use: No  . Sexual Activity: Not on file   Other Topics Concern  . Not on file   Social History Narrative  . No narrative on file    Past Surgical History  Procedure Laterality Date  . Cataract extraction    . Abdominal hysterectomy    . Tonsillectomy      No family history on file.  No Known Allergies  Current Outpatient Prescriptions on File Prior to Visit  Medication Sig Dispense Refill  . aspirin 81 MG tablet Take 81 mg by mouth daily.        . Multiple Vitamin (MULTIVITAMIN) tablet Take 1 tablet by mouth daily.         No current facility-administered medications on file prior to visit.    BP 120/70  Pulse 76  Temp(Src) 97.9 F (36.6 C) (Oral)  Resp 20  Ht 4' 11.25" (1.505 m)  Wt 137 lb (62.143 kg)  BMI 27.44  kg/m2  SpO2 98%        Objective:   Physical Exam  Constitutional: She is oriented to person, place, and time. She appears well-developed and well-nourished.  HENT:  Head: Normocephalic and atraumatic.  Right Ear: External ear normal.  Left Ear: External ear normal.  Mouth/Throat: Oropharynx is clear and moist.  Eyes: Conjunctivae and EOM are normal.  Neck: Normal range of motion. Neck supple. No JVD present. No thyromegaly present.       Faint  carotid bruits  Cardiovascular: Normal rate, regular rhythm, normal heart sounds and intact distal pulses.   No murmur heard.      Bilateral femoral bruits. Dorsalis pedis pulses full. Posterior tibial pulses not easily palpable  Pulmonary/Chest: Effort normal and breath sounds normal. She has no wheezes. She has no rales.  Abdominal: Soft. Bowel sounds are normal. She exhibits no distension and no mass. There is no tenderness. There is no rebound and no guarding.  Genitourinary: Guaiac negative stool.  Musculoskeletal: Normal range of motion. She exhibits no edema and no tenderness.  Neurological: She is alert and oriented  to person, place, and time. She has normal reflexes. No cranial nerve deficit. She exhibits normal muscle tone. Coordination normal.  Skin: Skin is warm and dry. No rash noted.  Psychiatric: She has a normal mood and affect. Her behavior is normal.          Assessment & Plan:   Preventive health examination Osteoporosis.  We'll continue calcium and vitamin D supplementation Impaired glucose tolerance. We'll check lab including a hemoglobin A1c Dyslipidemia-  lipids profile has been unremarkable low risk   We'll recheck one year or as needed A flu vaccine recommended but declined

## 2013-08-09 NOTE — Patient Instructions (Signed)
Limit your sodium (Salt) intake    It is important that you exercise regularly, at least 20 minutes 3 to 4 times per week.  If you develop chest pain or shortness of breath seek  medical attention.  Return in one year for follow-up   

## 2014-08-10 ENCOUNTER — Ambulatory Visit (INDEPENDENT_AMBULATORY_CARE_PROVIDER_SITE_OTHER): Payer: Medicare HMO | Admitting: Internal Medicine

## 2014-08-10 ENCOUNTER — Encounter: Payer: Self-pay | Admitting: Internal Medicine

## 2014-08-10 VITALS — BP 120/80 | HR 78 | Temp 97.9°F | Resp 18 | Ht 60.0 in | Wt 140.0 lb

## 2014-08-10 DIAGNOSIS — E7800 Pure hypercholesterolemia, unspecified: Secondary | ICD-10-CM

## 2014-08-10 DIAGNOSIS — M810 Age-related osteoporosis without current pathological fracture: Secondary | ICD-10-CM

## 2014-08-10 DIAGNOSIS — M150 Primary generalized (osteo)arthritis: Secondary | ICD-10-CM

## 2014-08-10 DIAGNOSIS — R7302 Impaired glucose tolerance (oral): Secondary | ICD-10-CM

## 2014-08-10 DIAGNOSIS — M159 Polyosteoarthritis, unspecified: Secondary | ICD-10-CM

## 2014-08-10 DIAGNOSIS — Z0000 Encounter for general adult medical examination without abnormal findings: Secondary | ICD-10-CM

## 2014-08-10 LAB — COMPREHENSIVE METABOLIC PANEL
ALBUMIN: 3.9 g/dL (ref 3.5–5.2)
ALT: 18 U/L (ref 0–35)
AST: 27 U/L (ref 0–37)
Alkaline Phosphatase: 64 U/L (ref 39–117)
BILIRUBIN TOTAL: 0.4 mg/dL (ref 0.2–1.2)
BUN: 11 mg/dL (ref 6–23)
CO2: 29 meq/L (ref 19–32)
Calcium: 9.5 mg/dL (ref 8.4–10.5)
Chloride: 98 mEq/L (ref 96–112)
Creatinine, Ser: 0.7 mg/dL (ref 0.4–1.2)
GFR: 78.41 mL/min (ref >60.00)
GLUCOSE: 110 mg/dL — AB (ref 70–99)
POTASSIUM: 4.7 meq/L (ref 3.5–5.1)
SODIUM: 135 meq/L (ref 135–145)
TOTAL PROTEIN: 7.4 g/dL (ref 6.0–8.3)

## 2014-08-10 LAB — CBC WITH DIFFERENTIAL/PLATELET
BASOS PCT: 0.6 % (ref 0.0–3.0)
Basophils Absolute: 0 10*3/uL (ref 0.0–0.1)
EOS PCT: 1.6 % (ref 0.0–5.0)
Eosinophils Absolute: 0.1 10*3/uL (ref 0.0–0.7)
HCT: 36.4 % (ref 36.0–46.0)
Hemoglobin: 11.9 g/dL — ABNORMAL LOW (ref 12.0–15.0)
Lymphocytes Relative: 27.5 % (ref 12.0–46.0)
Lymphs Abs: 1.7 10*3/uL (ref 0.7–4.0)
MCHC: 32.7 g/dL (ref 30.0–36.0)
MCV: 100.2 fl — ABNORMAL HIGH (ref 78.0–100.0)
MONOS PCT: 11.9 % (ref 3.0–12.0)
Monocytes Absolute: 0.7 10*3/uL (ref 0.1–1.0)
NEUTROS PCT: 58.4 % (ref 43.0–77.0)
Neutro Abs: 3.5 10*3/uL (ref 1.4–7.7)
PLATELETS: 244 10*3/uL (ref 150.0–400.0)
RBC: 3.64 Mil/uL — AB (ref 3.87–5.11)
RDW: 14.6 % (ref 11.5–15.5)
WBC: 6 10*3/uL (ref 4.0–10.5)

## 2014-08-10 LAB — TSH: TSH: 0.45 u[IU]/mL (ref 0.35–4.50)

## 2014-08-10 NOTE — Patient Instructions (Signed)
Take a calcium supplement, plus 800-1200 units of vitamin D    It is important that you exercise regularly, at least 20 minutes 3 to 4 times per week.  If you develop chest pain or shortness of breath seek  medical attention.  Return in one year for follow-up   

## 2014-08-10 NOTE — Progress Notes (Signed)
Patient ID: Stephanie Jenkins, female   DOB: 25-Jun-1923, 80 y.o.   MRN: [id number]  Subjective:    Patient ID: Stephanie Jenkins, female    DOB: 25-Jun-1923, 80 y.o.   MRN: [id number]  HPI  see  below    Review of Systems     see below  Objective:   Physical Exam  Neck:  No carotid bruits noted today  Neurological:  Decreased vibratory sensation distally    See below      Assessment & Plan:   Subjective:    Patient ID: Stephanie Jenkins, female    DOB: 25-Jun-1923, 80 y.o.   MRN: [id number]  HPI  History of Present Illness:   80   year old patient who is seen today for a health maintenance examination.  She does remarkably well with a history of osteoporosis. She has a history of impaired glucose tolerance. Mild dyslipidemia, and osteoarthritis.  She takes daily baby aspirin multivitamins only. She has been treated with Fosamax in the past. She lives independently and even does her own yard work  Here for [employer]:   1. Risk factors based on Past M, S, F history: risk factors include a history of mild dyslipidemia, as well as her age of 80  years old. She denies any cardiopulmonary complaints and does walk daily  2. Physical Activities: daily exercise - walks 1 hour each day 3. Depression/mood: no history of depression or mood disorder  4. Hearing: no significant deficits  5. ADL's: independent in all aspects of daily living; still drives locally and lives independently  6. Fall Risk: moderate due to her age. She has had no falls, however  7. Home Safety: no problems identified. Does have a daughter, who lives by and watches over her care.  8. Height, weight, &visual acuity:height and weight are stable. No difficulty with visual acuity  9. Counseling: heart healthy diet, and ongoing walking regimen encouraged  10. Labs ordered based on risk factors: laboratory profile, including lipid panel will be reviewed. A TSH will be reviewed  11. Referral Coordination- not appropriate at  this time  12. Care Plan- heart healthy diet; walking regimen encouraged  13. Cognitive Assessment- alert and oriented, with normal affect. No memory deficits; handles executive functions without difficulty   Allergies (verified):  No Known Drug Allergies   Past History:  Past Medical History:   DJD  High Cholesterol  Osteoporosis  Menopausal Syndrome  impaired glucose tolerance  bilateral carotid bruits  (h/o) and  Past Surgical History:    Dental Extraction  Cataract extraction  Hysterectomy  Tonsillectomy  gravida 5, para 4, abortus one  screening colonoscopy-none   Family History:   Family History of CAD Female 1st degree relative <50  Family History Kidney disease  Family History of Prostate CA 1st degree relative <50  Family History of Suicide attempt  Family History of Leukemia  father died age 166, renal cancer-history of CAD  mother died age 78  Four brothers-positive for leukemia and prostate cancer   Social History:   Retired  Single lives alone. Three adult children in the vicinity  Never Smoked  Widow/Widower since 1997    Review of Systems  Constitutional: Negative for fever, appetite change, fatigue and unexpected weight change.  HENT: Negative for hearing loss, ear pain, nosebleeds, congestion, sore throat, mouth sores, trouble swallowing, neck stiffness, dental problem, voice change, sinus pressure and tinnitus.   Eyes: Negative for photophobia, pain, redness and visual disturbance.  Respiratory: Negative for cough, chest tightness and shortness of breath.   Cardiovascular: Negative for chest pain, palpitations and leg swelling.  Gastrointestinal: Negative for nausea, vomiting, abdominal pain, diarrhea, constipation, blood in stool, abdominal distention and rectal pain.  Genitourinary: Negative for dysuria, urgency, frequency, hematuria, flank pain, vaginal bleeding, vaginal discharge, difficulty urinating, genital sores, vaginal pain, menstrual  problem and pelvic pain.  Musculoskeletal: Negative for back pain and arthralgias.  Skin: Negative for rash.  Neurological: Negative for dizziness, syncope, speech difficulty, weakness, light-headedness, numbness and headaches.  Hematological: Negative for adenopathy. Does not bruise/bleed easily.  Psychiatric/Behavioral: Negative for suicidal ideas, behavioral problems, self-injury, dysphoric mood and agitation. The patient is not nervous/anxious.     Past Medical History  Diagnosis Date  . DEGENERATIVE JOINT DISEASE 07/28/2008  . DIABETES MELLITUS, TYPE II, CONTROLLED 07/27/2007  . HYPERCHOLESTEROLEMIA, MILD 07/27/2007  . OSTEOPOROSIS 07/14/2007  . Carotid bruit     bilateral    History   Social History  . Marital Status: Widowed    Spouse Name: N/A    Number of Children: N/A  . Years of Education: N/A   Occupational History  . Not on file.   Social History Main Topics  . Smoking status: Never Smoker   . Smokeless tobacco: Never Used  . Alcohol Use: No  . Drug Use: No  . Sexual Activity: Not on file   Other Topics Concern  . Not on file   Social History Narrative  . No narrative on file    Past Surgical History  Procedure Laterality Date  . Cataract extraction    . Abdominal hysterectomy    . Tonsillectomy      No family history on file.  No Known Allergies  Current Outpatient Prescriptions on File Prior to Visit  Medication Sig Dispense Refill  . aspirin 81 MG tablet Take 81 mg by mouth daily.        . Multiple Vitamin (MULTIVITAMIN) tablet Take 1 tablet by mouth daily.         No current facility-administered medications on file prior to visit.    BP 120/80  Pulse 78  Temp(Src) 97.9 F (36.6 C) (Oral)  Resp 18  Ht 5' (1.524 m)  Wt 140 lb (63.504 kg)  BMI 27.34 kg/m2  SpO2 97%        Objective:   Physical Exam  Constitutional: She is oriented to person, place, and time. She appears well-developed and well-nourished.  HENT:  Head:  Normocephalic and atraumatic.  Right Ear: External ear normal.  Left Ear: External ear normal.  Mouth/Throat: Oropharynx is clear and moist.  Eyes: Conjunctivae and EOM are normal.  Neck: Normal range of motion. Neck supple. No JVD present. No thyromegaly present.   Cardiovascular: Normal rate, regular rhythm, normal heart sounds and intact distal pulses.   No murmur heard.      Bilateral femoral bruits. Dorsalis pedis pulses full. Posterior tibial pulses not easily palpable  Pulmonary/Chest: Effort normal and breath sounds normal. She has no wheezes. She has no rales.  Abdominal: Soft. Bowel sounds are normal. She exhibits no distension and no mass. There is no tenderness. There is no rebound and no guarding.  Genitourinary: Guaiac negative stool.  Musculoskeletal: Normal range of motion. She exhibits no edema and no tenderness.  Neurological: She is alert and oriented to person, place, and time. She has normal reflexes. No cranial nerve deficit. She exhibits normal muscle tone. Coordination normal.  Skin: Skin is warm and dry. No  rash noted.  Psychiatric: She has a normal mood and affect. Her behavior is normal.          Assessment & Plan:   Preventive health examination Osteoporosis.  We'll continue calcium and vitamin D supplementation Impaired glucose tolerance. We'll check lab including a hemoglobin A1c Dyslipidemia-  lipids profile has been unremarkable low risk   We'll recheck one year or as needed A flu vaccine recommended but declined

## 2014-08-10 NOTE — Progress Notes (Signed)
Pre visit review using our clinic review tool, if applicable. No additional management support is needed unless otherwise documented below in the visit note. 

## 2015-08-14 ENCOUNTER — Ambulatory Visit (INDEPENDENT_AMBULATORY_CARE_PROVIDER_SITE_OTHER): Payer: Medicare HMO | Admitting: Internal Medicine

## 2015-08-14 ENCOUNTER — Encounter: Payer: Self-pay | Admitting: Internal Medicine

## 2015-08-14 VITALS — BP 100/64 | HR 73 | Temp 98.4°F | Resp 18 | Ht 60.0 in | Wt 146.0 lb

## 2015-08-14 DIAGNOSIS — G3184 Mild cognitive impairment, so stated: Secondary | ICD-10-CM

## 2015-08-14 DIAGNOSIS — M810 Age-related osteoporosis without current pathological fracture: Secondary | ICD-10-CM | POA: Diagnosis not present

## 2015-08-14 DIAGNOSIS — M150 Primary generalized (osteo)arthritis: Secondary | ICD-10-CM | POA: Diagnosis not present

## 2015-08-14 DIAGNOSIS — R7302 Impaired glucose tolerance (oral): Secondary | ICD-10-CM | POA: Diagnosis not present

## 2015-08-14 DIAGNOSIS — M159 Polyosteoarthritis, unspecified: Secondary | ICD-10-CM

## 2015-08-14 DIAGNOSIS — E7800 Pure hypercholesterolemia, unspecified: Secondary | ICD-10-CM

## 2015-08-14 DIAGNOSIS — Z0000 Encounter for general adult medical examination without abnormal findings: Secondary | ICD-10-CM | POA: Diagnosis not present

## 2015-08-14 LAB — LIPID PANEL
CHOLESTEROL: 189 mg/dL (ref 0–200)
HDL: 64.8 mg/dL (ref >39.00)
LDL CALC: 109 mg/dL — AB (ref 0–99)
NonHDL: 124.34
TRIGLYCERIDES: 79 mg/dL (ref 0.0–149.0)
Total CHOL/HDL Ratio: 3
VLDL: 15.8 mg/dL (ref 0.0–40.0)

## 2015-08-14 LAB — COMPREHENSIVE METABOLIC PANEL
ALBUMIN: 4.3 g/dL (ref 3.5–5.2)
ALK PHOS: 58 U/L (ref 39–117)
ALT: 16 U/L (ref 0–35)
AST: 22 U/L (ref 0–37)
BILIRUBIN TOTAL: 0.4 mg/dL (ref 0.2–1.2)
BUN: 10 mg/dL (ref 6–23)
CALCIUM: 9.6 mg/dL (ref 8.4–10.5)
CHLORIDE: 95 meq/L — AB (ref 96–112)
CO2: 30 mEq/L (ref 19–32)
CREATININE: 0.74 mg/dL (ref 0.40–1.20)
GFR: 78.23 mL/min (ref >60.00)
Glucose, Bld: 93 mg/dL (ref 70–99)
Potassium: 4.5 mEq/L (ref 3.5–5.1)
SODIUM: 132 meq/L — AB (ref 135–145)
TOTAL PROTEIN: 7.1 g/dL (ref 6.0–8.3)

## 2015-08-14 LAB — CBC WITH DIFFERENTIAL/PLATELET
BASOS ABS: 0 10*3/uL (ref 0.0–0.1)
BASOS PCT: 0.7 % (ref 0.0–3.0)
EOS ABS: 0.1 10*3/uL (ref 0.0–0.7)
Eosinophils Relative: 2.2 % (ref 0.0–5.0)
HEMATOCRIT: 37.1 % (ref 36.0–46.0)
HEMOGLOBIN: 12.3 g/dL (ref 12.0–15.0)
LYMPHS PCT: 29.9 % (ref 12.0–46.0)
Lymphs Abs: 1.6 10*3/uL (ref 0.7–4.0)
MCHC: 33.1 g/dL (ref 30.0–36.0)
MCV: 99.2 fl (ref 78.0–100.0)
MONO ABS: 0.7 10*3/uL (ref 0.1–1.0)
Monocytes Relative: 13.7 % — ABNORMAL HIGH (ref 3.0–12.0)
Neutro Abs: 2.8 10*3/uL (ref 1.4–7.7)
Neutrophils Relative %: 53.5 % (ref 43.0–77.0)
Platelets: 249 10*3/uL (ref 150.0–400.0)
RBC: 3.74 Mil/uL — AB (ref 3.87–5.11)
RDW: 14.9 % (ref 11.5–15.5)
WBC: 5.3 10*3/uL (ref 4.0–10.5)

## 2015-08-14 LAB — TSH: TSH: 0.68 u[IU]/mL (ref 0.35–4.50)

## 2015-08-14 MED ORDER — DONEPEZIL HCL 5 MG PO TABS: 5.0000 mg | ORAL_TABLET | Freq: Every day | ORAL | Status: DC

## 2015-08-14 NOTE — Progress Notes (Signed)
Patient ID: Stephanie Jenkins, female   DOB: 10-Oct-1923, 81 y.o.   MRN: [id number]  Subjective:    Patient ID: Stephanie Jenkins, female    DOB: 1923-02-01, 81 y.o.   MRN: [id number]  HPI  see  below    Review of Systems     see below  Objective:   Physical Exam  Neck:  No carotid bruits noted today  Neurological:  Decreased vibratory sensation distally  Skin:  Onychomycotic toenail changes    See below      Assessment & Plan:   Subjective:    Patient ID: Stephanie Jenkins, female    DOB: 1923-06-04, 81 y.o.   MRN: [id number]  HPI  History of Present Illness:   81   year old patient who is seen today for a health maintenance examination.  She does remarkably well with a history of osteoporosis. She has a history of impaired glucose tolerance. Mild dyslipidemia, and osteoarthritis.  She takes daily baby aspirin multivitamins only. She has been treated with Fosamax in the past. She lives independently and even does her own yard work. Accompanied by her daughter today who is concerned about some mild cognitive impairment.  She still lives independently and drives a car.  The biggest concern is she occasionally gets lost when she drives.   MMSE  (08/14/15)  30/30  Here for [employer] AWV:   1. Risk factors based on Past M, S, F history: risk factors include a history of mild dyslipidemia, as well as her age of 81  years old. She denies any cardiopulmonary complaints and does walk daily  2. Physical Activities: daily exercise - walks 1 hour each day 3. Depression/mood: no history of depression or mood disorder  4. Hearing: no significant deficits  5. ADL's: independent in all aspects of daily living; still drives locally and lives independently  6. Fall Risk: moderate due to her age 81 She has had no falls, however  7. Home Safety: no problems identified. Does have a daughter, who lives by and watches over her care.  8. Height, weight, &visual acuity:height and weight are stable. No  difficulty with visual acuity  9. Counseling: heart healthy diet, and ongoing walking regimen encouraged  10. Labs ordered based on risk factors: laboratory profile, including lipid panel will be reviewed. A TSH will be reviewed  11. Referral Coordination- not appropriate at this time  12. Care Plan- heart healthy diet; walking regimen encouraged  13. Cognitive Assessment- alert and oriented, with normal affect. No memory deficits; handles executive functions without difficulty.  Risk concerned about mild memory deficits. MMSE today, normal  Allergies (verified):  No Known Drug Allergies   Past History:  Past Medical History:   DJD  High Cholesterol  Osteoporosis  Menopausal Syndrome  impaired glucose tolerance  bilateral carotid bruits  (h/o) and  Past Surgical History:    Dental Extraction  Cataract extraction  Hysterectomy  Tonsillectomy  gravida 5, para 4, abortus one  screening colonoscopy-none   Family History:   Family History of CAD Female 1st degree relative <50  Family History Jenkins disease  Family History of Prostate CA 1st degree relative <50  Family History of Suicide attempt  Family History of Leukemia  father died age 62, renal cancer-history of CAD  mother died age 79  Four brothers-positive for leukemia and prostate cancer   Social History:   Retired  Single lives alone. Three adult children in the vicinity  Never Smoked  Widow/Widower since 1997  Review of Systems  Constitutional: Negative for fever, appetite change, fatigue and unexpected weight change.  HENT: Negative for hearing loss, ear pain, nosebleeds, congestion, sore throat, mouth sores, trouble swallowing, neck stiffness, dental problem, voice change, sinus pressure and tinnitus.   Eyes: Negative for photophobia, pain, redness and visual disturbance.  Respiratory: Negative for cough, chest tightness and shortness of breath.   Cardiovascular: Negative for chest pain, palpitations and  leg swelling.  Gastrointestinal: Negative for nausea, vomiting, abdominal pain, diarrhea, constipation, blood in stool, abdominal distention and rectal pain.  Genitourinary: Negative for dysuria, urgency, frequency, hematuria, flank pain, vaginal bleeding, vaginal discharge, difficulty urinating, genital sores, vaginal pain, menstrual problem and pelvic pain.  Musculoskeletal: Negative for back pain and arthralgias.  Skin: Negative for rash.  Neurological: Negative for dizziness, syncope, speech difficulty, weakness, light-headedness, numbness and headaches.  Hematological: Negative for adenopathy. Does not bruise/bleed easily.  Psychiatric/Behavioral: Negative for suicidal ideas, behavioral problems, self-injury, dysphoric mood and agitation. The patient is not nervous/anxious.     Past Medical History  Diagnosis Date  . DEGENERATIVE JOINT DISEASE 07/28/2008  . DIABETES MELLITUS, TYPE II, CONTROLLED 07/27/2007  . HYPERCHOLESTEROLEMIA, MILD 07/27/2007  . OSTEOPOROSIS 07/14/2007  . Carotid bruit     bilateral    Social History   Social History  . Marital Status: Widowed    Spouse Name: N/A  . Number of Children: N/A  . Years of Education: N/A   Occupational History  . Not on file.   Social History Main Topics  . Smoking status: Never Smoker   . Smokeless tobacco: Never Used  . Alcohol Use: No  . Drug Use: No  . Sexual Activity: Not on file   Other Topics Concern  . Not on file   Social History Narrative    Past Surgical History  Procedure Laterality Date  . Cataract extraction    . Abdominal hysterectomy    . Tonsillectomy      No family history on file.  No Known Allergies  Current Outpatient Prescriptions on File Prior to Visit  Medication Sig Dispense Refill  . aspirin 81 MG tablet Take 81 mg by mouth daily.      . Multiple Vitamin (MULTIVITAMIN) tablet Take 1 tablet by mouth daily.       No current facility-administered medications on file prior to visit.     BP 100/64 mmHg  Pulse 73  Temp(Src) 98.4 F (36.9 C) (Oral)  Resp 18  Ht 5' (1.524 m)  Wt 146 lb (66.225 kg)  BMI 28.51 kg/m2  SpO2 97%        Objective:   Physical Exam  Constitutional: She is oriented to person, place, and time. She appears well-developed and well-nourished.  HENT:  Head: Normocephalic and atraumatic.  Right Ear: External ear normal.  Left Ear: External ear normal.  Mouth/Throat: Oropharynx is clear and moist.  Eyes: Conjunctivae and EOM are normal.  Neck: Normal range of motion. Neck supple. No JVD present. No thyromegaly present.   Cardiovascular: Normal rate, regular rhythm, normal heart sounds and intact distal pulses.   No murmur heard.      Bilateral femoral bruits. Dorsalis pedis pulses full. Posterior tibial pulses not easily palpable  Pulmonary/Chest: Effort normal and breath sounds normal. She has no wheezes. She has no rales.  Abdominal: Soft. Bowel sounds are normal. She exhibits no distension and no mass. There is no tenderness. There is no rebound and no guarding.  Genitourinary: Guaiac negative stool.  Musculoskeletal: Normal range  of motion. She exhibits no edema and no tenderness.  Neurological: She is alert and oriented to person, place, and time. She has normal reflexes. No cranial nerve deficit. She exhibits normal muscle tone. Coordination normal.  Skin: Skin is warm and dry. No rash noted.  Psychiatric: She has a normal mood and affect. Her behavior is normal.          Assessment & Plan:   Preventive health examination Osteoporosis.  We'll continue calcium and vitamin D supplementation Impaired glucose tolerance. We'll check lab including a hemoglobin A1c Dyslipidemia-  lipids profile has been unremarkable low risk Mild cognitive impairment.  Normal.  MMSE.  Daughter and patient quite concerned.  Trial Aricept 5.  Recommended that the patient no longer drives   We'll recheck one year or as needed A flu vaccine  recommended but declined

## 2015-08-14 NOTE — Patient Instructions (Signed)
Limit your sodium (Salt) intake    It is important that you exercise regularly, at least 20 minutes 3 to 4 times per week.  If you develop chest pain or shortness of breath seek  medical attention.  Return in one year for follow-up   

## 2015-08-14 NOTE — Progress Notes (Signed)
Pre visit review using our clinic review tool, if applicable. No additional management support is needed unless otherwise documented below in the visit note. 

## 2016-05-03 ENCOUNTER — Encounter: Payer: Self-pay | Admitting: Internal Medicine

## 2016-05-03 ENCOUNTER — Ambulatory Visit (INDEPENDENT_AMBULATORY_CARE_PROVIDER_SITE_OTHER): Payer: Medicare HMO | Admitting: Internal Medicine

## 2016-05-03 VITALS — BP 128/52 | HR 72 | Temp 98.4°F | Wt 149.0 lb

## 2016-05-03 DIAGNOSIS — H9193 Unspecified hearing loss, bilateral: Secondary | ICD-10-CM | POA: Diagnosis not present

## 2016-05-03 DIAGNOSIS — M159 Polyosteoarthritis, unspecified: Secondary | ICD-10-CM

## 2016-05-03 DIAGNOSIS — M150 Primary generalized (osteo)arthritis: Secondary | ICD-10-CM

## 2016-05-03 NOTE — Progress Notes (Signed)
Pre visit review using our clinic review tool, if applicable. No additional management support is needed unless otherwise documented below in the visit note. 

## 2016-05-03 NOTE — Progress Notes (Signed)
Subjective:    Patient ID: Stephanie Jenkins, female    DOB: 12-Mar-1923, 82 y.o.   MRN: [id number]  HPI  82 year old patient who has a history of osteoarthritis.  Complains of significant bilateral hip pain, but is followed by orthopedics. Her chief complaint today is worsening bilateral hearing.  No cardiopulmonary complaints  Past Medical History  Diagnosis Date  . DEGENERATIVE JOINT DISEASE 07/28/2008  . DIABETES MELLITUS, TYPE II, CONTROLLED 07/27/2007  . HYPERCHOLESTEROLEMIA, MILD 07/27/2007  . OSTEOPOROSIS 07/14/2007  . Carotid bruit     bilateral     Social History   Social History  . Marital Status: Widowed    Spouse Name: N/A  . Number of Children: N/A  . Years of Education: N/A   Occupational History  . Not on file.   Social History Main Topics  . Smoking status: Never Smoker   . Smokeless tobacco: Never Used  . Alcohol Use: No  . Drug Use: No  . Sexual Activity: Not on file   Other Topics Concern  . Not on file   Social History Narrative    Past Surgical History  Procedure Laterality Date  . Cataract extraction    . Abdominal hysterectomy    . Tonsillectomy      No family history on file.  No Known Allergies  Current Outpatient Prescriptions on File Prior to Visit  Medication Sig Dispense Refill  . aspirin 81 MG tablet Take 81 mg by mouth daily.      . Multiple Vitamin (MULTIVITAMIN) tablet Take 1 tablet by mouth daily.       No current facility-administered medications on file prior to visit.    BP 128/52 mmHg  Pulse 72  Temp(Src) 98.4 F (36.9 C) (Oral)  Wt 149 lb (67.586 kg)  SpO2 98%     Review of Systems  Constitutional: Negative.   HENT: Positive for hearing loss. Negative for congestion, dental problem, rhinorrhea, sinus pressure, sore throat and tinnitus.   Eyes: Negative for pain, discharge and visual disturbance.  Respiratory: Negative for cough and shortness of breath.   Cardiovascular: Negative for chest pain,  palpitations and leg swelling.  Gastrointestinal: Negative for nausea, vomiting, abdominal pain, diarrhea, constipation, blood in stool and abdominal distention.  Genitourinary: Negative for dysuria, urgency, frequency, hematuria, flank pain, vaginal bleeding, vaginal discharge, difficulty urinating, vaginal pain and pelvic pain.  Musculoskeletal: Positive for back pain, arthralgias and gait problem. Negative for joint swelling.       Bilateral hip pain  Skin: Negative for rash.  Neurological: Negative for dizziness, syncope, speech difficulty, weakness, numbness and headaches.  Hematological: Negative for adenopathy.  Psychiatric/Behavioral: Negative for behavioral problems, dysphoric mood and agitation. The patient is not nervous/anxious.        Objective:   Physical Exam  Constitutional: She is oriented to person, place, and time. She appears well-developed and well-nourished.  Alert and appropriate Blood pressure controlled Requires assistance to transfer to the examining table  HENT:  Head: Normocephalic.  Right Ear: External ear normal.  Left Ear: External ear normal.  Mouth/Throat: Oropharynx is clear and moist.  No cerumen in canals  Eyes: Conjunctivae and EOM are normal. Pupils are equal, round, and reactive to light.  Neck: Normal range of motion. Neck supple. No thyromegaly present.  Cardiovascular: Normal rate, regular rhythm, normal heart sounds and intact distal pulses.   Pulmonary/Chest: Effort normal and breath sounds normal.  Abdominal: Soft. Bowel sounds are normal. She exhibits no mass. There is no  tenderness.  Musculoskeletal: Normal range of motion. She exhibits edema.  Lymphadenopathy:    She has no cervical adenopathy.  Neurological: She is alert and oriented to person, place, and time.  Skin: Skin is warm and dry. No rash noted.  Psychiatric: She has a normal mood and affect. Her behavior is normal.          Assessment & Plan:   Hearing loss.  We'll  set up for audiology evaluation Significant arthritis  Recheck 6 months or as needed  Rogelia BogaKWIATKOWSKI,PETER FRANK, MD

## 2016-05-03 NOTE — Patient Instructions (Signed)
Audiology follow-up as discussed  Return in 6 months for follow-up

## 2016-08-16 ENCOUNTER — Encounter: Payer: Medicare HMO | Admitting: Internal Medicine

## 2016-08-30 ENCOUNTER — Encounter: Payer: Medicare HMO | Admitting: Internal Medicine

## 2016-09-03 ENCOUNTER — Ambulatory Visit (INDEPENDENT_AMBULATORY_CARE_PROVIDER_SITE_OTHER): Payer: Medicare HMO | Admitting: Internal Medicine

## 2016-09-03 ENCOUNTER — Encounter: Payer: Self-pay | Admitting: Internal Medicine

## 2016-09-03 VITALS — BP 120/80 | HR 67 | Temp 97.7°F | Resp 20 | Ht 59.0 in | Wt 146.0 lb

## 2016-09-03 DIAGNOSIS — R7302 Impaired glucose tolerance (oral): Secondary | ICD-10-CM | POA: Diagnosis not present

## 2016-09-03 DIAGNOSIS — M159 Polyosteoarthritis, unspecified: Secondary | ICD-10-CM

## 2016-09-03 DIAGNOSIS — M150 Primary generalized (osteo)arthritis: Secondary | ICD-10-CM

## 2016-09-03 DIAGNOSIS — E7800 Pure hypercholesterolemia, unspecified: Secondary | ICD-10-CM

## 2016-09-03 DIAGNOSIS — Z0000 Encounter for general adult medical examination without abnormal findings: Secondary | ICD-10-CM

## 2016-09-03 DIAGNOSIS — M810 Age-related osteoporosis without current pathological fracture: Secondary | ICD-10-CM | POA: Diagnosis not present

## 2016-09-03 LAB — COMPREHENSIVE METABOLIC PANEL
ALBUMIN: 4.4 g/dL (ref 3.5–5.2)
ALK PHOS: 60 U/L (ref 39–117)
ALT: 13 U/L (ref 0–35)
AST: 19 U/L (ref 0–37)
BILIRUBIN TOTAL: 0.6 mg/dL (ref 0.2–1.2)
BUN: 14 mg/dL (ref 6–23)
CALCIUM: 9.6 mg/dL (ref 8.4–10.5)
CO2: 27 mEq/L (ref 19–32)
CREATININE: 0.73 mg/dL (ref 0.40–1.20)
Chloride: 99 mEq/L (ref 96–112)
GFR: 79.29 mL/min (ref >60.00)
Glucose, Bld: 100 mg/dL — ABNORMAL HIGH (ref 70–99)
Potassium: 4.7 mEq/L (ref 3.5–5.1)
Sodium: 137 mEq/L (ref 135–145)
Total Protein: 6.8 g/dL (ref 6.0–8.3)

## 2016-09-03 LAB — CBC WITH DIFFERENTIAL/PLATELET
BASOS ABS: 0 10*3/uL (ref 0.0–0.1)
Basophils Relative: 0.5 % (ref 0.0–3.0)
EOS ABS: 0.1 10*3/uL (ref 0.0–0.7)
Eosinophils Relative: 2 % (ref 0.0–5.0)
HEMATOCRIT: 35.2 % — AB (ref 36.0–46.0)
HEMOGLOBIN: 11.8 g/dL — AB (ref 12.0–15.0)
LYMPHS PCT: 31.1 % (ref 12.0–46.0)
Lymphs Abs: 1.8 10*3/uL (ref 0.7–4.0)
MCHC: 33.5 g/dL (ref 30.0–36.0)
MCV: 98.2 fl (ref 78.0–100.0)
Monocytes Absolute: 0.8 10*3/uL (ref 0.1–1.0)
Monocytes Relative: 13.8 % — ABNORMAL HIGH (ref 3.0–12.0)
Neutro Abs: 3.1 10*3/uL (ref 1.4–7.7)
Neutrophils Relative %: 52.6 % (ref 43.0–77.0)
PLATELETS: 235 10*3/uL (ref 150.0–400.0)
RBC: 3.59 Mil/uL — AB (ref 3.87–5.11)
RDW: 14.6 % (ref 11.5–15.5)
WBC: 5.9 10*3/uL (ref 4.0–10.5)

## 2016-09-03 LAB — TSH: TSH: 0.57 u[IU]/mL (ref 0.35–4.50)

## 2016-09-03 MED ORDER — MELOXICAM 7.5 MG PO TABS
7.5000 mg | ORAL_TABLET | Freq: Every day | ORAL | 4 refills | Status: DC
Start: 2016-09-03 — End: 2016-11-08

## 2016-09-03 NOTE — Progress Notes (Signed)
Subjective:    Patient ID: Stephanie Jenkins, female    DOB: 09-10-1923, 82 y.o.   MRN: [id number]  HPI   History of Present Illness:   82  year old patient who is seen today for a health maintenance examination.  She does remarkably well with a history of osteoporosis. She has a history of impaired glucose tolerance. Mild dyslipidemia, and osteoarthritis.  She takes daily baby aspirin multivitamins only. She has been treated with Fosamax in the past. She lives independently and even does her own yard work.  MMSE  (08/14/15)  30/30  Since her exam last year.  She has moved closer to her daughter, no longer drives or does her own yard work.  Continues to do well. Is followed by orthopedics for back and hip pain Has obtained hearing aids which she uses infrequently   Here for Medicare AWV:   1. Risk factors based on Past M, S, F history: risk factors include a history of mild dyslipidemia, as well as her age of 82  years old. She denies any cardiopulmonary complaints and does walk daily  2. Physical Activities: daily exercise - walks 1 hour each day 3. Depression/mood: no history of depression or mood disorder  4. Hearing: Moderately severe deficits.  Uses hearing aids 5. ADL's: independent in all aspects of daily living; still lives independently but no longer drives  6. Fall Risk: moderate due to her age. She has had no falls, however  7. Home Safety: no problems identified. Does have a daughter, who lives by and watches over her care.  8. Height, weight, &visual acuity:height and weight are stable. No difficulty with visual acuity  9. Counseling: heart healthy diet, and ongoing walking regimen encouraged  10. Labs ordered based on risk factors: laboratory profile, including lipid panel will be reviewed. A TSH will be reviewed  11. Referral Coordination- not appropriate at this time  12. Care Plan- heart healthy diet; walking regimen encouraged  13. Cognitive Assessment- alert and  oriented, with normal affect. No memory deficits; handles executive functions without difficulty.  Risk concerned about mild memory deficits. 14.  Preventive services will include annual clinical examinations with screening lab.  Will continue with annual eye examinations 15.  Provider list includes primary care orthopedics, ophthalmology.  Allergies (verified):  No Known Drug Allergies   Past History:  Past Medical History:   DJD  High Cholesterol  Osteoporosis  Menopausal Syndrome  impaired glucose tolerance  bilateral carotid bruits  (h/o)   Past Surgical History:    Dental Extraction  Cataract extraction  Hysterectomy  Tonsillectomy  gravida 5, para 4, abortus one  screening colonoscopy-none   Family History:   Family History of CAD Female 1st degree relative <50  Family History Kidney disease  Family History of Prostate CA 1st degree relative <50  Family History of Suicide attempt  Family History of Leukemia  father died age 47, renal cancer-history of CAD  mother died age 55  Four brothers-positive for leukemia and prostate cancer   Social History:   Retired  Single lives alone. Three adult children in the vicinity  Never Smoked  Widow/Widower since 1997   Past Medical History:  Diagnosis Date  . Carotid bruit    bilateral  . DEGENERATIVE JOINT DISEASE 07/28/2008  . DIABETES MELLITUS, TYPE II, CONTROLLED 07/27/2007  . HYPERCHOLESTEROLEMIA, MILD 07/27/2007  . OSTEOPOROSIS 07/14/2007     Social History   Social History  . Marital status: Widowed  Spouse name: N/A  . Number of children: N/A  . Years of education: N/A   Occupational History  . Not on file.   Social History Main Topics  . Smoking status: Never Smoker  . Smokeless tobacco: Never Used  . Alcohol use No  . Drug use: No  . Sexual activity: Not on file   Other Topics Concern  . Not on file   Social History Narrative  . No narrative on file    Past Surgical History:    Procedure Laterality Date  . ABDOMINAL HYSTERECTOMY    . CATARACT EXTRACTION    . TONSILLECTOMY      No family history on file.  No Known Allergies  Current Outpatient Prescriptions on File Prior to Visit  Medication Sig Dispense Refill  . aspirin 81 MG tablet Take 81 mg by mouth daily.      . Glucosamine-Chondroit-Vit C-Mn (GLUCOSAMINE CHONDR 1500 COMPLX PO) Take by mouth.    . Multiple Vitamin (MULTIVITAMIN) tablet Take 1 tablet by mouth daily.      . Omega-3 Fatty Acids (OMEGA 3 PO) Take by mouth.     No current facility-administered medications on file prior to visit.     BP 120/80 (BP Location: Left Arm, Patient Position: Sitting, Cuff Size: Normal)   Pulse 67   Temp 97.7 F (36.5 C) (Oral)   Resp 20   Ht 4\' 11"  (1.499 m)   Wt 146 lb (66.2 kg)   SpO2 99%   BMI 29.49 kg/m    Review of Systems  Constitutional: Negative.   HENT: Positive for hearing loss. Negative for congestion, dental problem, rhinorrhea, sinus pressure, sore throat and tinnitus.   Eyes: Negative for pain, discharge and visual disturbance.  Respiratory: Negative for cough and shortness of breath.   Cardiovascular: Negative for chest pain, palpitations and leg swelling.  Gastrointestinal: Negative for abdominal distention, abdominal pain, blood in stool, constipation, diarrhea, nausea and vomiting.  Genitourinary: Negative for difficulty urinating, dysuria, flank pain, frequency, hematuria, pelvic pain, urgency, vaginal bleeding, vaginal discharge and vaginal pain.  Musculoskeletal: Positive for arthralgias and back pain. Negative for gait problem and joint swelling.  Skin: Negative for rash.  Neurological: Negative for dizziness, syncope, speech difficulty, weakness, numbness and headaches.  Hematological: Negative for adenopathy.  Psychiatric/Behavioral: Negative for agitation, behavioral problems and dysphoric mood. The patient is not nervous/anxious.        Objective:   Physical Exam   Constitutional: She is oriented to person, place, and time. She appears well-developed and well-nourished.  HENT:  Head: Normocephalic and atraumatic.  Right Ear: External ear normal.  Left Ear: External ear normal.  Mouth/Throat: Oropharynx is clear and moist.  Dentures in place.  Dentures in place  Eyes: Conjunctivae and EOM are normal.  Mild strabismus  Neck: Normal range of motion. Neck supple. No JVD present. No thyromegaly present.  Cardiovascular: Normal rate, regular rhythm, normal heart sounds and intact distal pulses.   No murmur heard. Dorsalis pedis pulses full.  Posterior tibial pulses faint  Pulmonary/Chest: Effort normal and breath sounds normal. She has no wheezes. She has no rales.  Abdominal: Soft. Bowel sounds are normal. She exhibits no distension and no mass. There is no tenderness. There is no rebound and no guarding.  Musculoskeletal: Normal range of motion. She exhibits no edema or tenderness.  Neurological: She is alert and oriented to person, place, and time. She has normal reflexes. No cranial nerve deficit. She exhibits normal muscle tone. Coordination normal.  Skin: Skin is warm and dry. No rash noted.  Onychomycotic toenail changes 2.  Patchy areas of dry patchy dermatitis involving the malar  regions of the face  Psychiatric: She has a normal mood and affect. Her behavior is normal.          Assessment & Plan:   Preventive health care Osteoarthritis Chronic low back pain Moderate hearing loss History mild dyslipidemia Osteoporosis.  Continue calcium and vitamin D supplements  Follow-up orthopedics Return here in one year or as needed  NIKEKWIATKOWSKI,PETER FRANK

## 2016-09-03 NOTE — Progress Notes (Signed)
Pre visit review using our clinic review tool, if applicable. No additional management support is needed unless otherwise documented below in the visit note. 

## 2016-09-03 NOTE — Patient Instructions (Signed)
Take a calcium supplement, plus 800-1200 units of vitamin D    It is important that you exercise regularly, at least 20 minutes 3 to 4 times per week.  If you develop chest pain or shortness of breath seek  medical attention.  Return in one year for follow-up   

## 2016-11-08 ENCOUNTER — Telehealth: Payer: Self-pay | Admitting: Internal Medicine

## 2016-11-08 MED ORDER — MELOXICAM 15 MG PO TABS: 15.0000 mg | ORAL_TABLET | Freq: Every day | ORAL | 0 refills | Status: DC

## 2016-11-08 NOTE — Telephone Encounter (Signed)
meloxicam  #60 one daily as needed

## 2016-11-08 NOTE — Telephone Encounter (Signed)
Spoke with Rosanna Randyarolyn Bean and informed of the increased Meloxicam to 15mg . Daughter expressed understanding.

## 2016-11-08 NOTE — Telephone Encounter (Signed)
Pts daughter is calling to see if Dr. Kirtland BouchardK would up the dosage of meloxicam to 15 MG?

## 2016-11-26 DIAGNOSIS — Z4689 Encounter for fitting and adjustment of other specified devices: Secondary | ICD-10-CM | POA: Diagnosis not present

## 2016-11-26 DIAGNOSIS — M4150 Other secondary scoliosis, site unspecified: Secondary | ICD-10-CM | POA: Diagnosis not present

## 2016-11-26 DIAGNOSIS — M47816 Spondylosis without myelopathy or radiculopathy, lumbar region: Secondary | ICD-10-CM | POA: Diagnosis not present

## 2016-12-08 ENCOUNTER — Other Ambulatory Visit: Payer: Self-pay | Admitting: Internal Medicine

## 2016-12-11 DIAGNOSIS — M47816 Spondylosis without myelopathy or radiculopathy, lumbar region: Secondary | ICD-10-CM | POA: Diagnosis not present

## 2016-12-11 DIAGNOSIS — M549 Dorsalgia, unspecified: Secondary | ICD-10-CM | POA: Diagnosis not present

## 2016-12-11 DIAGNOSIS — M4150 Other secondary scoliosis, site unspecified: Secondary | ICD-10-CM | POA: Diagnosis not present

## 2016-12-30 ENCOUNTER — Ambulatory Visit (INDEPENDENT_AMBULATORY_CARE_PROVIDER_SITE_OTHER): Payer: PPO | Admitting: Family Medicine

## 2016-12-30 VITALS — BP 140/76 | HR 74 | Temp 98.6°F | Ht 59.0 in | Wt 157.7 lb

## 2016-12-30 DIAGNOSIS — J209 Acute bronchitis, unspecified: Secondary | ICD-10-CM

## 2016-12-30 NOTE — Progress Notes (Signed)
Pre visit review using our clinic review tool, if applicable. No additional management support is needed unless otherwise documented below in the visit note. 

## 2016-12-30 NOTE — Patient Instructions (Signed)

## 2016-12-30 NOTE — Progress Notes (Signed)
Subjective:     Patient ID: Stephanie Jenkins, female   DOB: 03/24/1923, 83 y.o.   MRN: [id number]  HPI Patient is seen as a work in with cough for 4 days. This has been a dry cough. She's had some nasal congestion. Denies any body aches. No documented fever. Never smoked. Mild shortness of breath with activity. No history of asthma. She uses some NyQuil which helped her cough at night. No nausea or vomiting.  Past Medical History:  Diagnosis Date  . Carotid bruit    bilateral  . DEGENERATIVE JOINT DISEASE 07/28/2008  . DIABETES MELLITUS, TYPE II, CONTROLLED 07/27/2007  . HYPERCHOLESTEROLEMIA, MILD 07/27/2007  . OSTEOPOROSIS 07/14/2007   Past Surgical History:  Procedure Laterality Date  . ABDOMINAL HYSTERECTOMY    . CATARACT EXTRACTION    . TONSILLECTOMY      reports that she has never smoked. She has never used smokeless tobacco. She reports that she does not drink alcohol or use drugs. family history is not on file. No Known Allergies   Review of Systems  Constitutional: Positive for fatigue. Negative for chills and fever.  HENT: Positive for congestion.   Respiratory: Positive for cough.   Cardiovascular: Negative for chest pain.       Objective:   Physical Exam  Constitutional: She appears well-developed and well-nourished.  HENT:  Right Ear: External ear normal.  Left Ear: External ear normal.  Mouth/Throat: Oropharynx is clear and moist.  Neck: Neck supple.  Cardiovascular: Normal rate and regular rhythm.   Pulmonary/Chest: Effort normal and breath sounds normal. No respiratory distress. She has no wheezes. She has no rales.  Musculoskeletal: She exhibits no edema.  Lymphadenopathy:    She has no cervical adenopathy.       Assessment:     Cough. Suspect acute viral bronchitis. Nonfocal exam    Plan:     -Follow-up promptly for any fever or increased shortness of breath -They will continue with over-the-counter cough medications as needed  Kristian CoveyBruce W Hashem Goynes  MD  [facility]

## 2017-01-02 DIAGNOSIS — M47816 Spondylosis without myelopathy or radiculopathy, lumbar region: Secondary | ICD-10-CM | POA: Diagnosis not present

## 2017-01-02 DIAGNOSIS — M4150 Other secondary scoliosis, site unspecified: Secondary | ICD-10-CM | POA: Diagnosis not present

## 2017-01-06 ENCOUNTER — Telehealth: Payer: Self-pay | Admitting: Internal Medicine

## 2017-01-06 NOTE — Telephone Encounter (Signed)
Daughter states pt is still not better, this upper resp has been going on 3 weeks now. Pt saw Dr Caryl NeverBurchette last Monday, 2/26 and still coughing. Pt declined an appt for today.  Daughter hopes dr can call in something for this ongoing cough and congestion.  [facility] [phone] - [place], [place] - [address] [facility] [address]

## 2017-01-06 NOTE — Telephone Encounter (Signed)
Left message on machine for patient to return our call 

## 2017-01-06 NOTE — Telephone Encounter (Signed)
Take over-the-counter expectorants and cough medications such as  Mucinex DM.  Call if there is no improvement in 5 to 7 days or if  you develop worsening cough, fever, or new symptoms, such as shortness of breath or chest pain.  Please call in a prescription for generic Tessalon perles 100 mg #21 one 3 times a day

## 2017-01-09 ENCOUNTER — Encounter: Payer: Self-pay | Admitting: Internal Medicine

## 2017-01-09 ENCOUNTER — Ambulatory Visit (INDEPENDENT_AMBULATORY_CARE_PROVIDER_SITE_OTHER): Payer: PPO | Admitting: Internal Medicine

## 2017-01-09 VITALS — BP 122/62 | HR 80 | Temp 99.0°F | Ht 59.0 in | Wt 155.8 lb

## 2017-01-09 DIAGNOSIS — J069 Acute upper respiratory infection, unspecified: Secondary | ICD-10-CM | POA: Diagnosis not present

## 2017-01-09 DIAGNOSIS — B9789 Other viral agents as the cause of diseases classified elsewhere: Secondary | ICD-10-CM

## 2017-01-09 DIAGNOSIS — R7302 Impaired glucose tolerance (oral): Secondary | ICD-10-CM | POA: Diagnosis not present

## 2017-01-09 NOTE — Patient Instructions (Addendum)
Acute bronchitis symptoms are generally not helped by antibiotics.  Take over-the-counter expectorants and cough medications such as  Mucinex DM.  Call if there is no improvement in 5 to 7 days or if  you develop worsening cough, fever, or new symptoms, such as shortness of breath or chest pain.  Hydrate and Humidify   Drink enough water to keep your urine clear or pale yellow. Staying hydrated will help to thin your mucus.  Use a cool mist humidifier to keep the humidity level in your home above 50%.  Inhale steam for 10-15 minutes, 3-4 times a day or as told by your health care provider. You can do this in the bathroom while a hot shower is running.  Limit your exposure to cool or dry air.  

## 2017-01-09 NOTE — Telephone Encounter (Signed)
Patient has an appointment to follow up with Dr KKirtland Bouchard

## 2017-01-09 NOTE — Progress Notes (Signed)
Pre visit review using our clinic review tool, if applicable. No additional management support is needed unless otherwise documented below in the visit note. 

## 2017-01-09 NOTE — Progress Notes (Signed)
Subjective:    Patient ID: Stephanie Jenkins, female    DOB: 07/09/1923, 83 y.o.   MRN: [id number]  HPI  83 year old patient who was seen 10 days ago and treated for acute viral bronchitis. Patient continues to have cough.  She feels she has now developed some sinus congestion and postnasal drip No fever.  She sleeps well through the night Cough remains nonproductive She has noticed some scanty bloody nasal discharge  Past Medical History:  Diagnosis Date  . Carotid bruit    bilateral  . DEGENERATIVE JOINT DISEASE 07/28/2008  . DIABETES MELLITUS, TYPE II, CONTROLLED 07/27/2007  . HYPERCHOLESTEROLEMIA, MILD 07/27/2007  . OSTEOPOROSIS 07/14/2007     Social History   Social History  . Marital status: Widowed    Spouse name: N/A  . Number of children: N/A  . Years of education: N/A   Occupational History  . Not on file.   Social History Main Topics  . Smoking status: Never Smoker  . Smokeless tobacco: Never Used  . Alcohol use No  . Drug use: No  . Sexual activity: Not on file   Other Topics Concern  . Not on file   Social History Narrative  . No narrative on file    Past Surgical History:  Procedure Laterality Date  . ABDOMINAL HYSTERECTOMY    . CATARACT EXTRACTION    . TONSILLECTOMY      No family history on file.  No Known Allergies  Current Outpatient Prescriptions on File Prior to Visit  Medication Sig Dispense Refill  . aspirin 81 MG tablet Take 81 mg by mouth daily.      . Glucosamine-Chondroit-Vit C-Mn (GLUCOSAMINE CHONDR 1500 COMPLX PO) Take by mouth.    . meloxicam (MOBIC) 15 MG tablet TAKE 1 TABLET(15 MG) BY MOUTH DAILY 90 tablet 0  . Multiple Vitamin (MULTIVITAMIN) tablet Take 1 tablet by mouth daily.      . Omega-3 Fatty Acids (OMEGA 3 PO) Take by mouth.     No current facility-administered medications on file prior to visit.     BP 122/62 (BP Location: Left Arm, Patient Position: Sitting, Cuff Size: Normal)   Pulse 80   Temp 99 F (37.2 C)  (Oral)   Ht 4\' 11"  (1.499 m)   Wt 155 lb 12.8 oz (70.7 kg)   SpO2 98%   BMI 31.47 kg/m     Review of Systems  Constitutional: Positive for activity change, appetite change and fatigue. Negative for chills, diaphoresis and fever.  HENT: Positive for congestion, postnasal drip and rhinorrhea. Negative for dental problem, hearing loss, sinus pressure, sore throat and tinnitus.   Eyes: Negative for pain, discharge and visual disturbance.  Respiratory: Positive for cough. Negative for shortness of breath.   Cardiovascular: Negative for chest pain, palpitations and leg swelling.  Gastrointestinal: Negative for abdominal distention, abdominal pain, blood in stool, constipation, diarrhea, nausea and vomiting.  Genitourinary: Negative for difficulty urinating, dysuria, flank pain, frequency, hematuria, pelvic pain, urgency, vaginal bleeding, vaginal discharge and vaginal pain.  Musculoskeletal: Negative for arthralgias, gait problem and joint swelling.  Skin: Negative for rash.  Neurological: Negative for dizziness, syncope, speech difficulty, weakness, numbness and headaches.  Hematological: Negative for adenopathy.  Psychiatric/Behavioral: Negative for agitation, behavioral problems and dysphoric mood. The patient is not nervous/anxious.        Objective:   Physical Exam  Constitutional: She is oriented to person, place, and time. She appears well-developed and well-nourished. No distress.  HENT:  Head: Normocephalic.  Right Ear: External ear normal.  Left Ear: External ear normal.  Mouth/Throat: Oropharynx is clear and moist.  Eyes: Conjunctivae and EOM are normal. Pupils are equal, round, and reactive to light.  Neck: Normal range of motion. Neck supple. No thyromegaly present.  Cardiovascular: Normal rate, regular rhythm, normal heart sounds and intact distal pulses.   Pulmonary/Chest: Effort normal and breath sounds normal. No respiratory distress. She has no wheezes. She has no  rales.  Abdominal: Soft. Bowel sounds are normal. She exhibits no mass. There is no tenderness.  Musculoskeletal: Normal range of motion.  Lymphadenopathy:    She has no cervical adenopathy.  Neurological: She is alert and oriented to person, place, and time.  Skin: Skin is warm and dry. No rash noted.  Psychiatric: She has a normal mood and affect. Her behavior is normal.          Assessment & Plan:   Viral URI with cough Will continue symptomatic treatment  Detailed patient instructions provided with aftercare visit summary  Rogelia BogaKWIATKOWSKI,Okie Jansson FRANK

## 2017-01-13 DIAGNOSIS — M47816 Spondylosis without myelopathy or radiculopathy, lumbar region: Secondary | ICD-10-CM | POA: Diagnosis not present

## 2017-01-21 DIAGNOSIS — M47816 Spondylosis without myelopathy or radiculopathy, lumbar region: Secondary | ICD-10-CM | POA: Diagnosis not present

## 2017-02-19 DIAGNOSIS — M47817 Spondylosis without myelopathy or radiculopathy, lumbosacral region: Secondary | ICD-10-CM | POA: Diagnosis not present

## 2017-02-19 DIAGNOSIS — M47816 Spondylosis without myelopathy or radiculopathy, lumbar region: Secondary | ICD-10-CM | POA: Diagnosis not present

## 2017-03-20 DIAGNOSIS — M791 Myalgia: Secondary | ICD-10-CM | POA: Diagnosis not present

## 2017-03-20 DIAGNOSIS — M47816 Spondylosis without myelopathy or radiculopathy, lumbar region: Secondary | ICD-10-CM | POA: Diagnosis not present

## 2017-03-20 DIAGNOSIS — M4150 Other secondary scoliosis, site unspecified: Secondary | ICD-10-CM | POA: Diagnosis not present

## 2017-03-28 ENCOUNTER — Other Ambulatory Visit: Payer: Self-pay | Admitting: Rehabilitation

## 2017-03-28 DIAGNOSIS — M47816 Spondylosis without myelopathy or radiculopathy, lumbar region: Secondary | ICD-10-CM

## 2017-04-04 ENCOUNTER — Encounter (HOSPITAL_COMMUNITY): Payer: Self-pay | Admitting: Emergency Medicine

## 2017-04-04 ENCOUNTER — Ambulatory Visit (HOSPITAL_COMMUNITY)
Admission: EM | Admit: 2017-04-04 | Discharge: 2017-04-04 | Disposition: A | Discharge status: Home / Self Care | Payer: PPO

## 2017-04-04 ENCOUNTER — Inpatient Hospital Stay (HOSPITAL_COMMUNITY)
Admission: EM | Admit: 2017-04-04 | Discharge: 2017-04-07 | DRG: 552 | Disposition: A | Discharge status: Skilled Nursing Facility | Payer: PPO | Attending: Internal Medicine | Admitting: Internal Medicine

## 2017-04-04 ENCOUNTER — Telehealth: Payer: Self-pay

## 2017-04-04 ENCOUNTER — Emergency Department (HOSPITAL_COMMUNITY): Payer: PPO

## 2017-04-04 DIAGNOSIS — S3210XA Unspecified fracture of sacrum, initial encounter for closed fracture: Principal | ICD-10-CM | POA: Diagnosis present

## 2017-04-04 DIAGNOSIS — Z79899 Other long term (current) drug therapy: Secondary | ICD-10-CM

## 2017-04-04 DIAGNOSIS — S32591A Other specified fracture of right pubis, initial encounter for closed fracture: Secondary | ICD-10-CM | POA: Diagnosis not present

## 2017-04-04 DIAGNOSIS — R52 Pain, unspecified: Secondary | ICD-10-CM

## 2017-04-04 DIAGNOSIS — R7302 Impaired glucose tolerance (oral): Secondary | ICD-10-CM | POA: Diagnosis not present

## 2017-04-04 DIAGNOSIS — M25551 Pain in right hip: Secondary | ICD-10-CM | POA: Diagnosis not present

## 2017-04-04 DIAGNOSIS — Z7982 Long term (current) use of aspirin: Secondary | ICD-10-CM

## 2017-04-04 DIAGNOSIS — N390 Urinary tract infection, site not specified: Secondary | ICD-10-CM | POA: Diagnosis not present

## 2017-04-04 DIAGNOSIS — S32511A Fracture of superior rim of right pubis, initial encounter for closed fracture: Secondary | ICD-10-CM | POA: Diagnosis not present

## 2017-04-04 DIAGNOSIS — S72009A Fracture of unspecified part of neck of unspecified femur, initial encounter for closed fracture: Secondary | ICD-10-CM

## 2017-04-04 DIAGNOSIS — E7800 Pure hypercholesterolemia, unspecified: Secondary | ICD-10-CM | POA: Diagnosis present

## 2017-04-04 DIAGNOSIS — M419 Scoliosis, unspecified: Secondary | ICD-10-CM | POA: Diagnosis present

## 2017-04-04 DIAGNOSIS — E785 Hyperlipidemia, unspecified: Secondary | ICD-10-CM | POA: Diagnosis not present

## 2017-04-04 DIAGNOSIS — M810 Age-related osteoporosis without current pathological fracture: Secondary | ICD-10-CM | POA: Diagnosis not present

## 2017-04-04 DIAGNOSIS — E119 Type 2 diabetes mellitus without complications: Secondary | ICD-10-CM | POA: Diagnosis not present

## 2017-04-04 DIAGNOSIS — S329XXA Fracture of unspecified parts of lumbosacral spine and pelvis, initial encounter for closed fracture: Secondary | ICD-10-CM | POA: Diagnosis present

## 2017-04-04 DIAGNOSIS — S3282XD Multiple fractures of pelvis without disruption of pelvic ring, subsequent encounter for fracture with routine healing: Secondary | ICD-10-CM | POA: Diagnosis not present

## 2017-04-04 DIAGNOSIS — W19XXXA Unspecified fall, initial encounter: Secondary | ICD-10-CM | POA: Diagnosis not present

## 2017-04-04 HISTORY — DX: Fracture of unspecified parts of lumbosacral spine and pelvis, initial encounter for closed fracture: S32.9XXA

## 2017-04-04 LAB — BASIC METABOLIC PANEL
Anion gap: 8 (ref 5–15)
BUN: 25 mg/dL — ABNORMAL HIGH (ref 6–20)
CALCIUM: 8.8 mg/dL — AB (ref 8.9–10.3)
CO2: 27 mmol/L (ref 22–32)
CREATININE: 0.95 mg/dL (ref 0.44–1.00)
Chloride: 99 mmol/L — ABNORMAL LOW (ref 101–111)
GFR calc non Af Amer: 50 mL/min — ABNORMAL LOW (ref >60)
GFR, EST AFRICAN AMERICAN: 58 mL/min — AB (ref >60)
GLUCOSE: 136 mg/dL — AB (ref 65–99)
Potassium: 4.4 mmol/L (ref 3.5–5.1)
Sodium: 134 mmol/L — ABNORMAL LOW (ref 135–145)

## 2017-04-04 LAB — URINALYSIS, ROUTINE W REFLEX MICROSCOPIC
BILIRUBIN URINE: NEGATIVE
Glucose, UA: NEGATIVE mg/dL
HGB URINE DIPSTICK: NEGATIVE
Ketones, ur: NEGATIVE mg/dL
NITRITE: NEGATIVE
PH: 7 (ref 5.0–8.0)
Protein, ur: NEGATIVE mg/dL
SPECIFIC GRAVITY, URINE: 1.006 (ref 1.005–1.030)

## 2017-04-04 LAB — CBC WITH DIFFERENTIAL/PLATELET
BASOS PCT: 0 %
Basophils Absolute: 0 10*3/uL (ref 0.0–0.1)
EOS ABS: 0.1 10*3/uL (ref 0.0–0.7)
EOS PCT: 1 %
HCT: 33 % — ABNORMAL LOW (ref 36.0–46.0)
Hemoglobin: 11.4 g/dL — ABNORMAL LOW (ref 12.0–15.0)
Lymphocytes Relative: 11 %
Lymphs Abs: 1.3 10*3/uL (ref 0.7–4.0)
MCH: 32.9 pg (ref 26.0–34.0)
MCHC: 34.5 g/dL (ref 30.0–36.0)
MCV: 95.1 fL (ref 78.0–100.0)
MONO ABS: 1.2 10*3/uL — AB (ref 0.1–1.0)
MONOS PCT: 10 %
Neutro Abs: 9.5 10*3/uL — ABNORMAL HIGH (ref 1.7–7.7)
Neutrophils Relative %: 78 %
Platelets: 234 10*3/uL (ref 150–400)
RBC: 3.47 MIL/uL — ABNORMAL LOW (ref 3.87–5.11)
RDW: 14.7 % (ref 11.5–15.5)
WBC: 12 10*3/uL — ABNORMAL HIGH (ref 4.0–10.5)

## 2017-04-04 LAB — GLUCOSE, CAPILLARY: Glucose-Capillary: 150 mg/dL — ABNORMAL HIGH (ref 65–99)

## 2017-04-04 MED ORDER — MORPHINE SULFATE (PF) 2 MG/ML IV SOLN
4.0000 mg | Freq: Once | INTRAVENOUS | Status: DC
Start: 2017-04-04 — End: 2017-04-04

## 2017-04-04 MED ORDER — ENOXAPARIN SODIUM 30 MG/0.3ML ~~LOC~~ SOLN
30.0000 mg | SUBCUTANEOUS | Status: DC
  Administered 2017-04-04 – 2017-04-06 (×3): 30 mg via SUBCUTANEOUS
  Filled 2017-04-04 (×3): qty 0.3

## 2017-04-04 MED ORDER — INSULIN ASPART 100 UNIT/ML ~~LOC~~ SOLN
0.0000 [IU] | Freq: Three times a day (TID) | SUBCUTANEOUS | Status: DC
Start: 2017-04-04 — End: 2017-04-07
  Administered 2017-04-04: 1 [IU] via SUBCUTANEOUS
  Administered 2017-04-05 (×2): 2 [IU] via SUBCUTANEOUS
  Administered 2017-04-06 (×2): 1 [IU] via SUBCUTANEOUS
  Administered 2017-04-06: 2 [IU] via SUBCUTANEOUS
  Administered 2017-04-07: 1 [IU] via SUBCUTANEOUS

## 2017-04-04 MED ORDER — MELOXICAM 15 MG PO TABS
15.0000 mg | ORAL_TABLET | Freq: Every day | ORAL | Status: DC
  Administered 2017-04-05 – 2017-04-07 (×3): 15 mg via ORAL
  Filled 2017-04-04 (×3): qty 1

## 2017-04-04 MED ORDER — MORPHINE SULFATE (PF) 2 MG/ML IV SOLN
2.0000 mg | INTRAVENOUS | Status: DC | PRN
  Administered 2017-04-04 – 2017-04-05 (×2): 2 mg via INTRAVENOUS
  Filled 2017-04-04 (×2): qty 1

## 2017-04-04 MED ORDER — ASPIRIN EC 81 MG PO TBEC
81.0000 mg | DELAYED_RELEASE_TABLET | Freq: Every day | ORAL | Status: DC
  Administered 2017-04-05 – 2017-04-06 (×2): 81 mg via ORAL
  Filled 2017-04-04 (×3): qty 1

## 2017-04-04 MED ORDER — HYDRALAZINE HCL 20 MG/ML IJ SOLN
5.0000 mg | INTRAMUSCULAR | Status: DC | PRN
  Filled 2017-04-04: qty 0.25

## 2017-04-04 MED ORDER — METHOCARBAMOL 500 MG PO TABS
500.0000 mg | ORAL_TABLET | Freq: Four times a day (QID) | ORAL | Status: DC | PRN
  Administered 2017-04-05 – 2017-04-06 (×2): 500 mg via ORAL
  Filled 2017-04-04 (×2): qty 1

## 2017-04-04 MED ORDER — POLYETHYLENE GLYCOL 3350 17 G PO PACK
17.0000 g | PACK | Freq: Every day | ORAL | Status: DC
  Administered 2017-04-04 – 2017-04-07 (×4): 17 g via ORAL
  Filled 2017-04-04 (×4): qty 1

## 2017-04-04 MED ORDER — SODIUM CHLORIDE 0.9 % IV SOLN
INTRAVENOUS | Status: AC
  Administered 2017-04-04 – 2017-04-05 (×3): via INTRAVENOUS

## 2017-04-04 MED ORDER — HYDROCODONE-ACETAMINOPHEN 5-325 MG PO TABS
1.0000 | ORAL_TABLET | Freq: Four times a day (QID) | ORAL | Status: DC | PRN
  Administered 2017-04-05 – 2017-04-07 (×3): 1 via ORAL
  Filled 2017-04-04 (×3): qty 1

## 2017-04-04 MED ORDER — ADULT MULTIVITAMIN W/MINERALS CH
1.0000 | ORAL_TABLET | Freq: Every day | ORAL | Status: DC
  Administered 2017-04-05 – 2017-04-07 (×3): 1 via ORAL
  Filled 2017-04-04 (×3): qty 1

## 2017-04-04 MED ORDER — MORPHINE SULFATE (PF) 2 MG/ML IV SOLN
2.0000 mg | Freq: Once | INTRAVENOUS | Status: AC
  Administered 2017-04-04: 2 mg via INTRAVENOUS
  Filled 2017-04-04: qty 1

## 2017-04-04 NOTE — H&P (Signed)
History and Physical    ESTHA FEW ZOX:[id number] DOB: 1923/09/06 DOA: 04/04/2017  I have briefly reviewed the patient's prior medical records in [facility]  PCP: Gordy Savers, MD  Patient coming from: Home  Chief Complaint: Fall  HPI: Stephanie Jenkins is a 83 y.o. female with medical history significant of osteoporosis, mild hyperlipidemia, diet-controlled diabetes mellitus, and otherwise healthy presents to the emergency room with chief complaint of hip pain after suffering a fall.  Patient was out walking with her daughter and thinks she tripped on something on the street and landed on the ground.  She denies any syncope.  She denies any chest pain or palpitations.  She denies any loss of consciousness and remembers all the fall.  She is otherwise feeling at baseline, has had no fever or chills, no abdominal pain nausea vomiting or diarrhea.  She denies any dysuria.  In the emergency room her vital signs are stable, she is afebrile and normotensive, blood work is remarkable for sodium of 134, BUN 25 creatinine 0.9, white count of 12.  Imaging of the hips showed nondisplaced right inferior and superior pubic ramus fracture.  TRH is asked for admission for pain control/PT.   Review of Systems: As per HPI otherwise 10 point review of systems negative.   Past Medical History:  Diagnosis Date  . Carotid bruit    bilateral  . DEGENERATIVE JOINT DISEASE 07/28/2008  . DIABETES MELLITUS, TYPE II, CONTROLLED 07/27/2007  . HYPERCHOLESTEROLEMIA, MILD 07/27/2007  . OSTEOPOROSIS 07/14/2007    Past Surgical History:  Procedure Laterality Date  . ABDOMINAL HYSTERECTOMY    . CATARACT EXTRACTION    . TONSILLECTOMY       reports that she has never smoked. She has never used smokeless tobacco. She reports that she does not drink alcohol or use drugs.  No Known Allergies  Family history reviewed and non contributory  Prior to Admission medications   Medication Sig Start Date End Date  Taking? Authorizing Provider  aspirin 81 MG tablet Take 81 mg by mouth daily.     Yes [provider]  Glucosamine-Chondroit-Vit C-Mn (GLUCOSAMINE CHONDR 1500 COMPLX PO) Take 1 tablet by mouth 2 (two) times daily.    Yes [provider]  meloxicam (MOBIC) 15 MG tablet TAKE 1 TABLET(15 MG) BY MOUTH DAILY 12/09/16  Yes Gordy Savers, MD  methocarbamol (ROBAXIN) 500 MG tablet Take 500-1,000 mg by mouth 2 (two) times daily as needed. 03/24/17  Yes [provider]  Multiple Vitamin (MULTIVITAMIN) tablet Take 1 tablet by mouth daily.     Yes [provider]  polyethylene glycol (MIRALAX / GLYCOLAX) packet Take 17 g by mouth daily.   Yes [provider]    Physical Exam: Vitals:   04/04/17 1347 04/04/17 1542  BP: (!) 149/58 (!) 161/60  Pulse: 68 74  Resp: (!) 24 18  Temp: 98.1 F (36.7 C)   TempSrc: Oral   SpO2: 100% 98%  Weight: 70.3 kg (155 lb)   Height: 4\' 8"  (1.422 m)      Constitutional: NAD, calm, comfortable Eyes: PERRL, lids and conjunctivae normal ENMT: Mucous membranes are moist. Posterior pharynx clear of any exudate or lesions. Neck: normal, supple, no masses Respiratory: clear to auscultation bilaterally, no wheezing, no crackles. Normal respiratory effort. No accessory muscle use.  Cardiovascular: Regular rate and rhythm, no murmurs / rubs / gallops. Trace extremity edema. 2+ pedal pulses.  Abdomen: no tenderness, no masses palpated. Bowel sounds positive.  Musculoskeletal: no clubbing / cyanosis. Normal muscle tone.  Skin: no rashes, lesions, ulcers. No induration Neurologic: CN 2-12 grossly intact. Strength 5/5 in all 4.  Psychiatric: Normal judgment and insight. Alert and oriented x 3. Normal mood.   Labs on Admission: I have personally reviewed following labs and imaging studies  CBC:  Recent Labs Lab 04/04/17 1530  WBC 12.0*  NEUTROABS 9.5*  HGB 11.4*  HCT 33.0*  MCV 95.1  PLT 234   Basic Metabolic  Panel:  Recent Labs Lab 04/04/17 1530  NA 134*  K 4.4  CL 99*  CO2 27  GLUCOSE 136*  BUN 25*  CREATININE 0.95  CALCIUM 8.8*   GFR: Estimated Creatinine Clearance: 29.8 mL/min (by C-G formula based on SCr of 0.95 mg/dL). Liver Function Tests: No results for input(s): AST, ALT, ALKPHOS, BILITOT, PROT, ALBUMIN in the last 168 hours. No results for input(s): LIPASE, AMYLASE in the last 168 hours. No results for input(s): AMMONIA in the last 168 hours. Coagulation Profile: No results for input(s): INR, PROTIME in the last 168 hours. Cardiac Enzymes: No results for input(s): CKTOTAL, CKMB, CKMBINDEX, TROPONINI in the last 168 hours. BNP (last 3 results) No results for input(s): PROBNP in the last 8760 hours. HbA1C: No results for input(s): HGBA1C in the last 72 hours. CBG: No results for input(s): GLUCAP in the last 168 hours. Lipid Profile: No results for input(s): CHOL, HDL, LDLCALC, TRIG, CHOLHDL, LDLDIRECT in the last 72 hours. Thyroid Function Tests: No results for input(s): TSH, T4TOTAL, FREET4, T3FREE, THYROIDAB in the last 72 hours. Anemia Panel: No results for input(s): VITAMINB12, FOLATE, FERRITIN, TIBC, IRON, RETICCTPCT in the last 72 hours. Urine analysis:    Component Value Date/Time   COLORURINE yellow 07/27/2007 0000   APPEARANCEUR Clear 07/27/2007 0000   LABSPEC 1.015 07/27/2007 0000   PHURINE 7.0 07/27/2007 0000   HGBUR 2+ 07/27/2007 0000   BILIRUBINUR negative 07/27/2007 0000   UROBILINOGEN 0.2 07/27/2007 0000   NITRITE negative 07/27/2007 0000     Radiological Exams on Admission: Dg Hip Unilat  With Pelvis 2-3 Views Right  Result Date: 04/04/2017 CLINICAL DATA:  Fall in gym today while walking. Right hip pain. Initial encounter. EXAM: DG HIP (WITH OR WITHOUT PELVIS) 2-3V RIGHT COMPARISON:  None. FINDINGS: Subtle but convincing nondisplaced fractures involving the superior and inferior right pubic rami. No visible sacrum fracture. The right hip is  located and appears intact. Osteopenia. Lumbar scoliosis. IMPRESSION: Nondisplaced right inferior and superior pubic ramus fractures. Electronically Signed   By: Marnee SpringJonathon  Watts M.D.   On: 04/04/2017 15:03     Assessment/Plan Active Problems:   HYPERCHOLESTEROLEMIA, MILD   Osteoporosis   Impaired glucose tolerance   Pelvic fracture (HCC)   Pubic rami fracture -non displaced, conservative measures with pain control, muscle relaxer, physical therapy -She appears somewhat dehydrated, might have contributed to the fall, gentle hydration overnight.  Repeat renal function in the morning.  Diet-controlled diabetes mellitus -most recent hemoglobin A1c was 6.5 in 2012.  Obtain hemoglobin A1c, place patient on sliding scale insulin  Leukocytosis -Likely reactive, will obtain a urinalysis for completeness.   DVT prophylaxis: Lovenox Code Status: DNR Family Communication: Daughter at bedside Disposition Plan: Admit to MedSurg, SNF versus home with home health on discharge Consults called: None    Admission status: Observation  At the point of initial evaluation, it is my clinical opinion that admission for OBSERVATION is reasonable and necessary because the patient's presenting complaints in the context of their chronic  conditions represent sufficient risk of deterioration or significant morbidity to constitute reasonable grounds for close observation in the hospital setting, but that the patient may be medically stable for discharge from the hospital within 24 to 48 hours.    Pamella Pert, MD [facility] Pager [phone]  If 7PM-7AM, please contact night-coverage [email or web address] Password [email or web address]  04/04/2017, 4:25 PM

## 2017-04-04 NOTE — ED Provider Notes (Signed)
[facility]-EMERGENCY DEPT Provider Note   CSN: [id number] Arrival date & time: 04/04/17  1341     History   Chief Complaint Chief Complaint  Patient presents with  . Hip Pain    HPI Stephanie Jenkins is a 83 y.o. female.  Patient is a 83 year old female with past medical history of diabetes, osteoporosis who presents with hip pain after a fall. She was ambulatory with her daughter when she tripped and fell forward, injuring her right hip. She was able to stand and take several steps prior to having to sit back down. She was then brought here for evaluation.   The history is provided by the patient.  Hip Pain  This is a new problem. The current episode started 1 to 2 hours ago. The problem occurs constantly. The problem has not changed since onset.The symptoms are aggravated by walking. Nothing relieves the symptoms. She has tried nothing for the symptoms.    Past Medical History:  Diagnosis Date  . Carotid bruit    bilateral  . DEGENERATIVE JOINT DISEASE 07/28/2008  . DIABETES MELLITUS, TYPE II, CONTROLLED 07/27/2007  . HYPERCHOLESTEROLEMIA, MILD 07/27/2007  . OSTEOPOROSIS 07/14/2007    Patient Active Problem List   Diagnosis Date Noted  . Impaired glucose tolerance 08/07/2012  . Osteoarthritis 07/28/2008  . HYPERCHOLESTEROLEMIA, MILD 07/27/2007  . Osteoporosis 07/14/2007    Past Surgical History:  Procedure Laterality Date  . ABDOMINAL HYSTERECTOMY    . CATARACT EXTRACTION    . TONSILLECTOMY      OB History    No data available       Home Medications    Prior to Admission medications   Medication Sig Start Date End Date Taking? Authorizing Provider  aspirin 81 MG tablet Take 81 mg by mouth daily.      [provider]  Glucosamine-Chondroit-Vit C-Mn (GLUCOSAMINE CHONDR 1500 COMPLX PO) Take by mouth.    [provider]  meloxicam (MOBIC) 15 MG tablet TAKE 1 TABLET(15 MG) BY MOUTH DAILY 12/09/16   Gordy Savers, MD  Multiple Vitamin (MULTIVITAMIN)  tablet Take 1 tablet by mouth daily.      [provider]  Omega-3 Fatty Acids (OMEGA 3 PO) Take by mouth.    [provider]    Family History No family history on file.  Social History Social History  Substance Use Topics  . Smoking status: Never Smoker  . Smokeless tobacco: Never Used  . Alcohol use No     Allergies   Patient has no known allergies.   Review of Systems Review of Systems  All other systems reviewed and are negative.    Physical Exam Updated Vital Signs BP (!) 149/58   Pulse 68   Temp 98.1 F (36.7 C) (Oral)   Resp (!) 24   Ht 4\' 8"  (1.422 m)   Wt 70.3 kg (155 lb)   SpO2 100%   BMI 34.75 kg/m   Physical Exam  Constitutional: She is oriented to person, place, and time. She appears well-developed and well-nourished. No distress.  HENT:  Head: Normocephalic and atraumatic.  Neck: Normal range of motion. Neck supple.  Cardiovascular: Normal rate and regular rhythm.  Exam reveals no gallop and no friction rub.   No murmur heard. Pulmonary/Chest: Effort normal and breath sounds normal. No respiratory distress. She has no wheezes.  Abdominal: Soft. Bowel sounds are normal. She exhibits no distension. There is no tenderness.  Musculoskeletal: Normal range of motion.  There is no obvious shortening  or external rotation of the leg. DP pulses are easily palpable. Motor and sensation are intact the entire foot. Pelvis is stable.  Neurological: She is alert and oriented to person, place, and time.  Skin: Skin is warm and dry. She is not diaphoretic.  Nursing note and vitals reviewed.    ED Treatments / Results  Labs (all labs ordered are listed, but only abnormal results are displayed) Labs Reviewed  BASIC METABOLIC PANEL  CBC WITH DIFFERENTIAL/PLATELET    EKG  EKG Interpretation None       Radiology No results found.  Procedures Procedures (including critical care time)  Medications Ordered in ED Medications - No  data to display   Initial Impression / Assessment and Plan / ED Course  I have reviewed the triage vital signs and the nursing notes.  Pertinent labs & imaging results that were available during my care of the patient were reviewed by me and considered in my medical decision making (see chart for details).  X-rays reveal superior and inferior rami fractures on the right. As the patient is 83 years old with limited mobility, I do not feel as though she will do well at home. I'll consult the hospitalist who will admit the patient for pain control and PT consultation.  Final Clinical Impressions(s) / ED Diagnoses   Final diagnoses:  None    New Prescriptions New Prescriptions   No medications on file     Geoffery Lyonselo, Tyra Gural, MD 04/04/17 1610

## 2017-04-04 NOTE — Telephone Encounter (Signed)
Pt's daughter called to report pt had a mechanical fall this morning. She was walking and tripped over her own foot. She landed on her right hip and right shoulder. Pt was able to get up after several minutes of rest, however stated that it felt like "somthing was popping in her groin". She was not able to ambulate on her own so daughter has provided her with a walker for assistance and pt is able to ambulate with that. She states pt c/o extreme pain and difficulty with ROM/walking.   Per Dr. Kirtland BouchardK pt needs to go to [facility] immediately for xrays of hip/pelvis and examination/evaluation. He does not feel pt should have extended wait in ED due to age/health.   Daughter advised and agrees. She will transport pt to [facility] immediately. Nothing further needed at this time.

## 2017-04-04 NOTE — ED Triage Notes (Signed)
Patient states that this morning she was walking along and then down she went.  Patient thinks that her foot may have gotten tied up with the other. Patient c/o right hip pain.

## 2017-04-05 ENCOUNTER — Observation Stay (HOSPITAL_COMMUNITY): Payer: PPO

## 2017-04-05 DIAGNOSIS — S3282XD Multiple fractures of pelvis without disruption of pelvic ring, subsequent encounter for fracture with routine healing: Secondary | ICD-10-CM

## 2017-04-05 DIAGNOSIS — S329XXA Fracture of unspecified parts of lumbosacral spine and pelvis, initial encounter for closed fracture: Secondary | ICD-10-CM | POA: Diagnosis not present

## 2017-04-05 DIAGNOSIS — S32591A Other specified fracture of right pubis, initial encounter for closed fracture: Secondary | ICD-10-CM | POA: Diagnosis not present

## 2017-04-05 DIAGNOSIS — Z7982 Long term (current) use of aspirin: Secondary | ICD-10-CM | POA: Diagnosis not present

## 2017-04-05 DIAGNOSIS — R2689 Other abnormalities of gait and mobility: Secondary | ICD-10-CM | POA: Diagnosis not present

## 2017-04-05 DIAGNOSIS — E119 Type 2 diabetes mellitus without complications: Secondary | ICD-10-CM | POA: Diagnosis not present

## 2017-04-05 DIAGNOSIS — N390 Urinary tract infection, site not specified: Secondary | ICD-10-CM | POA: Diagnosis not present

## 2017-04-05 DIAGNOSIS — Z7409 Other reduced mobility: Secondary | ICD-10-CM | POA: Diagnosis not present

## 2017-04-05 DIAGNOSIS — Z79899 Other long term (current) drug therapy: Secondary | ICD-10-CM | POA: Diagnosis not present

## 2017-04-05 DIAGNOSIS — E785 Hyperlipidemia, unspecified: Secondary | ICD-10-CM | POA: Diagnosis not present

## 2017-04-05 DIAGNOSIS — S3210XA Unspecified fracture of sacrum, initial encounter for closed fracture: Secondary | ICD-10-CM | POA: Diagnosis not present

## 2017-04-05 DIAGNOSIS — R7302 Impaired glucose tolerance (oral): Secondary | ICD-10-CM | POA: Diagnosis not present

## 2017-04-05 DIAGNOSIS — W19XXXA Unspecified fall, initial encounter: Secondary | ICD-10-CM | POA: Diagnosis not present

## 2017-04-05 DIAGNOSIS — M810 Age-related osteoporosis without current pathological fracture: Secondary | ICD-10-CM

## 2017-04-05 DIAGNOSIS — E7800 Pure hypercholesterolemia, unspecified: Secondary | ICD-10-CM | POA: Diagnosis not present

## 2017-04-05 DIAGNOSIS — M25551 Pain in right hip: Secondary | ICD-10-CM | POA: Diagnosis not present

## 2017-04-05 DIAGNOSIS — S72009A Fracture of unspecified part of neck of unspecified femur, initial encounter for closed fracture: Secondary | ICD-10-CM | POA: Diagnosis not present

## 2017-04-05 DIAGNOSIS — S3993XA Unspecified injury of pelvis, initial encounter: Secondary | ICD-10-CM | POA: Diagnosis not present

## 2017-04-05 DIAGNOSIS — M419 Scoliosis, unspecified: Secondary | ICD-10-CM | POA: Diagnosis not present

## 2017-04-05 DIAGNOSIS — R41841 Cognitive communication deficit: Secondary | ICD-10-CM | POA: Diagnosis not present

## 2017-04-05 DIAGNOSIS — M6281 Muscle weakness (generalized): Secondary | ICD-10-CM | POA: Diagnosis not present

## 2017-04-05 LAB — CBC
HCT: 31.9 % — ABNORMAL LOW (ref 36.0–46.0)
Hemoglobin: 10.7 g/dL — ABNORMAL LOW (ref 12.0–15.0)
MCH: 32.1 pg (ref 26.0–34.0)
MCHC: 33.5 g/dL (ref 30.0–36.0)
MCV: 95.8 fL (ref 78.0–100.0)
PLATELETS: 226 10*3/uL (ref 150–400)
RBC: 3.33 MIL/uL — ABNORMAL LOW (ref 3.87–5.11)
RDW: 14.8 % (ref 11.5–15.5)
WBC: 5.6 10*3/uL (ref 4.0–10.5)

## 2017-04-05 LAB — BASIC METABOLIC PANEL
ANION GAP: 7 (ref 5–15)
BUN: 17 mg/dL (ref 6–20)
CALCIUM: 8.3 mg/dL — AB (ref 8.9–10.3)
CO2: 25 mmol/L (ref 22–32)
CREATININE: 0.81 mg/dL (ref 0.44–1.00)
Chloride: 102 mmol/L (ref 101–111)
GFR calc Af Amer: >60 mL/min (ref >60)
Glucose, Bld: 115 mg/dL — ABNORMAL HIGH (ref 65–99)
Potassium: 4.6 mmol/L (ref 3.5–5.1)
SODIUM: 134 mmol/L — AB (ref 135–145)

## 2017-04-05 LAB — GLUCOSE, CAPILLARY
GLUCOSE-CAPILLARY: 106 mg/dL — AB (ref 65–99)
Glucose-Capillary: 182 mg/dL — ABNORMAL HIGH (ref 65–99)
Glucose-Capillary: 174 mg/dL — ABNORMAL HIGH (ref 65–99)
Glucose-Capillary: 150 mg/dL — ABNORMAL HIGH (ref 65–99)

## 2017-04-05 MED ORDER — DEXTROSE 5 % IV SOLN
1.0000 g | INTRAVENOUS | Status: DC
  Administered 2017-04-05 – 2017-04-07 (×3): 1 g via INTRAVENOUS
  Filled 2017-04-05 (×3): qty 10

## 2017-04-05 NOTE — Progress Notes (Signed)
PROGRESS NOTE  Sharma Covertauline F Venneman ZOX:[id number] DOB: 1923-06-28 DOA: 04/04/2017 PCP: Gordy SaversKwiatkowski, Peter F, MD   LOS: 0 days   Brief Narrative / Interim history:  83 y.o. female with medical history significant of osteoporosis, mild hyperlipidemia, diet-controlled diabetes mellitus, and otherwise healthy presents to the emergency room with chief complaint of hip pain after suffering a fall.  She was found to have nondisplaced right inferior and superior pubic ramus fractures  Assessment & Plan: Active Problems:   HYPERCHOLESTEROLEMIA, MILD   Osteoporosis   Impaired glucose tolerance   Pelvic fracture (HCC)   Pubic rami fracture and right sacrum fracture -non displaced, conservative measures with pain control, muscle relaxer, physical therapy -She was somewhat dehydrated in the ED, this is improved after overnight hydration -Due to excruciating pain with minimal maneuvers this morning, obtain a CT scan of the right hip to rule out a fracture which would not have been visualized on plain film. CT scan was negative for hip fracture  UTI -Potentially cause of her weakness, urine cultures are pending, started ceftriaxone.  She had a white count of 12 in the ED, but improved this morning 5.6.  Diet-controlled diabetes mellitus -most recent hemoglobin A1c was 6.5 in 2012.  Obtain hemoglobin A1c, place patient on sliding scale insulin -CBGs this morning 100-180   DVT prophylaxis: Lovenox Code Status: DNR Family Communication: Discussed with daughter at bedside Disposition Plan: Home versus SNF, PT evaluation pending   Consultants:   None   Procedures:   None   Antimicrobials:  Ceftriaxone 6/2 >>  Subjective: -in pain, she is OK when in bed but barely able to move  Objective: Vitals:   04/04/17 1820 04/04/17 2154 04/05/17 0500 04/05/17 0646  BP: (!) 170/68 (!) 99/52 (!) 160/71   Pulse:  73 73   Resp:  18 18   Temp:  99 F (37.2 C) 99.6 F (37.6 C) 98.2 F (36.8 C)    TempSrc:  Oral Oral Oral  SpO2:  95% 96%   Weight:      Height:        Intake/Output Summary (Last 24 hours) at 04/05/17 1343 Last data filed at 04/05/17 1300  Gross per 24 hour  Intake           1432.5 ml  Output              500 ml  Net            932.5 ml   Filed Weights   04/04/17 1347  Weight: 70.3 kg (155 lb)    Examination:  Vitals:   04/04/17 1820 04/04/17 2154 04/05/17 0500 04/05/17 0646  BP: (!) 170/68 (!) 99/52 (!) 160/71   Pulse:  73 73   Resp:  18 18   Temp:  99 F (37.2 C) 99.6 F (37.6 C) 98.2 F (36.8 C)  TempSrc:  Oral Oral Oral  SpO2:  95% 96%   Weight:      Height:        Constitutional: NAD Eyes: lids and conjunctivae normal Respiratory: clear to auscultation bilaterally, no wheezing, no crackles. Normal respiratory effort. No accessory muscle use.  Cardiovascular: Regular rate and rhythm, no murmurs / rubs / gallops. No LE edema. Abdomen: no tenderness. Bowel sounds positive.  Neurologic: non focal    Data Reviewed: I have personally reviewed following labs and imaging studies  CBC:  Recent Labs Lab 04/04/17 1530 04/05/17 0621  WBC 12.0* 5.6  NEUTROABS 9.5*  --  HGB 11.4* 10.7*  HCT 33.0* 31.9*  MCV 95.1 95.8  PLT 234 226   Basic Metabolic Panel:  Recent Labs Lab 04/04/17 1530 04/05/17 0621  NA 134* 134*  K 4.4 4.6  CL 99* 102  CO2 27 25  GLUCOSE 136* 115*  BUN 25* 17  CREATININE 0.95 0.81  CALCIUM 8.8* 8.3*   GFR: Estimated Creatinine Clearance: 34.9 mL/min (by C-G formula based on SCr of 0.81 mg/dL). Liver Function Tests: No results for input(s): AST, ALT, ALKPHOS, BILITOT, PROT, ALBUMIN in the last 168 hours. No results for input(s): LIPASE, AMYLASE in the last 168 hours. No results for input(s): AMMONIA in the last 168 hours. Coagulation Profile: No results for input(s): INR, PROTIME in the last 168 hours. Cardiac Enzymes: No results for input(s): CKTOTAL, CKMB, CKMBINDEX, TROPONINI in the last 168  hours. BNP (last 3 results) No results for input(s): PROBNP in the last 8760 hours. HbA1C: No results for input(s): HGBA1C in the last 72 hours. CBG:  Recent Labs Lab 04/04/17 2152 04/05/17 0735 04/05/17 1133  GLUCAP 150* 106* 182*   Lipid Profile: No results for input(s): CHOL, HDL, LDLCALC, TRIG, CHOLHDL, LDLDIRECT in the last 72 hours. Thyroid Function Tests: No results for input(s): TSH, T4TOTAL, FREET4, T3FREE, THYROIDAB in the last 72 hours. Anemia Panel: No results for input(s): VITAMINB12, FOLATE, FERRITIN, TIBC, IRON, RETICCTPCT in the last 72 hours. Urine analysis:    Component Value Date/Time   COLORURINE YELLOW 04/04/2017 1610   APPEARANCEUR CLEAR 04/04/2017 1610   LABSPEC 1.006 04/04/2017 1610   PHURINE 7.0 04/04/2017 1610   GLUCOSEU NEGATIVE 04/04/2017 1610   HGBUR NEGATIVE 04/04/2017 1610   HGBUR 2+ 07/27/2007 0000   BILIRUBINUR NEGATIVE 04/04/2017 1610   KETONESUR NEGATIVE 04/04/2017 1610   PROTEINUR NEGATIVE 04/04/2017 1610   UROBILINOGEN 0.2 07/27/2007 0000   NITRITE NEGATIVE 04/04/2017 1610   LEUKOCYTESUR SMALL (A) 04/04/2017 1610   Sepsis Labs: Invalid input(s): PROCALCITONIN, LACTICIDVEN  No results found for this or any previous visit (from the past 240 hour(s)).    Radiology Studies: Ct Hip Right Wo Contrast  Result Date: 04/05/2017 CLINICAL DATA:  Right hip pain due to a fall yesterday. Pubic rami fractures by comparison plain films. Initial encounter. EXAM: CT OF THE RIGHT HIP WITHOUT CONTRAST TECHNIQUE: Multidetector CT imaging of the right hip was performed according to the standard protocol. Multiplanar CT image reconstructions were also generated. COMPARISON:  Plain films right hip 04/04/2017. FINDINGS: Bones/Joint/Cartilage There is partial visualization of a nondisplaced fracture of the lateral aspect of the right sacrum. The patient has nondisplaced segmental fractures through the mid and inferior aspect of the right superior pubic ramus.  Nondisplaced right inferior pubic ramus fracture is also identified. The right hip is located and no hip fracture is identified. Ligaments Suboptimally assessed by CT. Muscles and Tendons Intact. Soft tissues No right hip effusion. No acute intrapelvic abnormality. Prominent stool in the rectosigmoid colon is noted. IMPRESSION: Nondisplaced fractures of the periphery of the right sacrum and the right superior and inferior pubic rami. Negative for hip fracture. Prominent rectosigmoid stool burden. Electronically Signed   By: Drusilla Kanner M.D.   On: 04/05/2017 10:04   Dg Hip Unilat  With Pelvis 2-3 Views Right  Result Date: 04/04/2017 CLINICAL DATA:  Fall in gym today while walking. Right hip pain. Initial encounter. EXAM: DG HIP (WITH OR WITHOUT PELVIS) 2-3V RIGHT COMPARISON:  None. FINDINGS: Subtle but convincing nondisplaced fractures involving the superior and inferior right pubic rami. No  visible sacrum fracture. The right hip is located and appears intact. Osteopenia. Lumbar scoliosis. IMPRESSION: Nondisplaced right inferior and superior pubic ramus fractures. Electronically Signed   By: Marnee Spring M.D.   On: 04/04/2017 15:03     Scheduled Meds: . aspirin EC  81 mg Oral Daily  . enoxaparin (LOVENOX) injection  30 mg Subcutaneous Q24H  . insulin aspart  0-9 Units Subcutaneous TID WC  . meloxicam  15 mg Oral Daily  . multivitamin with minerals  1 tablet Oral Daily  . polyethylene glycol  17 g Oral Daily   Continuous Infusions: . sodium chloride 75 mL/hr at 04/05/17 0500  . cefTRIAXone (ROCEPHIN)  IV Stopped (04/05/17 1015)    Pamella Pert, MD, PhD [facility] Pager [phone]  If 7PM-7AM, please contact night-coverage [email or web address] Password [email or web address] 04/05/2017, 1:43 PM

## 2017-04-05 NOTE — Clinical Social Work Note (Signed)
Per discussion with Dr. Elvera LennoxGherghe earlier today- patient will be stable for d/c in 1-2 more days.  PT recommends SNF or if she refuses HHPT and OT.  This SW was unable to evaluate today. Asking Sunday coverage SW to assess patient tomorrow morning asap.  Fl2 and [place] completed and placed in chart to aide with SNF placement should patient agree to this d/c plan.  Lorri Frederickonna T. Jaci LazierCrowder, Kentucky[place]CSW [phone]

## 2017-04-05 NOTE — Evaluation (Signed)
Physical Therapy Evaluation Patient Details Name: Stephanie Jenkins F Strick MRN: [id number] DOB: 1923/04/04 Today's Date: 04/05/2017   History of Present Illness  83 year old, lives alone and walks daily fell while walking laps at the exercise center (unsure of the cause of the fall, for she did not trip over an obstacle etc, no LOC, etc). xray with non diplaced R inferiro and superior pubic rami fratures. Dr. Elvera LennoxGherghe confirmed with the nurse patient is WBAT.   Clinical Impression  Very pleasant 83 yo who is active at home and home alone, fell and broke her R inferior and superior rami now WBAT with great pain with any movement and mobility. Pt currently needed assist with any mobility and to benefit from continued PT services here in acute care and post acute.     Follow Up Recommendations SNF (if not SNF, then HHPT and HHOT, and aides if possible. )    Equipment Recommendations  Rolling walker with 5" wheels;Wheelchair (measurements PT) (may need WC for her distance is so limited. )    Recommendations for Other Services       Precautions / Restrictions Precautions Precautions: Fall Restrictions Weight Bearing Restrictions: No (nurse, Marissa, confirmed with Dr. Elvera LennoxGherghe that patient is WBAT. )      Mobility  Bed Mobility Overal bed mobility: Needs Assistance Bed Mobility: Supine to Sit;Sit to Supine     Supine to sit: Mod assist;HOB elevated     General bed mobility comments: use of rail and assisted pt to EOB ustilizing pad under her to minimize the pain. Exited the pateint's right side of the bed for ease.   Transfers Overall transfer level: Needs assistance Equipment used: Rolling walker (2 wheeled) Transfers: Sit to/from Stand Sit to Stand: Min assist         General transfer comment: cues for RW safety and hand placement   Ambulation/Gait Ambulation/Gait assistance: Mod assist Ambulation Distance (Feet): 7 Feet Assistive device: Rolling walker (2 wheeled) Gait  Pattern/deviations: Step-to pattern Gait velocity: slow    General Gait Details: pianful , step to pattern, only able to tolerate about 7 feet and brought recliner to her for her to sit. helped de weight pateints weight bearing through assist under arm and through RW  to minimize pain. Difficulty advancing and picking up R LE due to pain.   Stairs            Wheelchair Mobility    Modified Rankin (Stroke Patients Only)       Balance                                             Pertinent Vitals/Pain Pain Assessment: 0-10 Pain Score: 5  Pain Location: feels better than when I got up earlier, however she just took her pain medicine and muscle relaxer. R pelvic hip area Pain Descriptors / Indicators: Aching Pain Intervention(s): Limited activity within patient's tolerance;Monitored during session;Premedicated before session;Ice applied    Home Living Family/patient expects to be discharged to:: Private residence (daughter live next door however she is the caregiver of her own husband and uses a walker occassionally. If need they do have other family taht can come in to help the first week, and they will do what they can if she does go home. ) Living Arrangements: Alone Available Help at Discharge: Family (see note above ) Type of Home: House  Home Access: Ramped entrance     Home Layout: One level Home Equipment: None      Prior Function Level of Independence: Independent         Comments: Pt did all her ADLs, cooking , housework no AD, just did not drive anymore, and walked daily .      Hand Dominance        Extremity/Trunk Assessment        Lower Extremity Assessment Lower Extremity Assessment: RLE deficits/detail RLE Deficits / Details: great difficulty moving R LE in bed and advancing with gait due to pain in R pelvic and hip area with any movement        Communication   Communication: No difficulties  Cognition Arousal/Alertness:  Awake/alert Behavior During Therapy: WFL for tasks assessed/performed Overall Cognitive Status: Within Functional Limits for tasks assessed                                        General Comments      Exercises Other Exercises Other Exercises: encouraged ankle pumps, quad sets adn glut sets for increased circultation and isometric contractions of muscles in these ares.    Assessment/Plan    PT Assessment Patient needs continued PT services  PT Problem List Decreased strength;Decreased activity tolerance;Decreased mobility       PT Treatment Interventions Gait training;Functional mobility training;Therapeutic activities;Therapeutic exercise;Patient/family education    PT Goals (Current goals can be found in the Care Plan section)  Acute Rehab PT Goals Patient Stated Goal: I want to do whatever I need to. I will do my best  PT Goal Formulation: With patient/family Time For Goal Achievement: 04/19/17 Potential to Achieve Goals: Good    Frequency Min 4X/week   Barriers to discharge        Co-evaluation               AM-PAC PT "6 Clicks" Daily Activity  Outcome Measure Difficulty turning over in bed (including adjusting bedclothes, sheets and blankets)?: A Lot Difficulty moving from lying on back to sitting on the side of the bed? : A Lot Difficulty sitting down on and standing up from a chair with arms (e.g., wheelchair, bedside commode, etc,.)?: A Lot Help needed moving to and from a bed to chair (including a wheelchair)?: A Lot Help needed walking in hospital room?: A Lot Help needed climbing 3-5 steps with a railing? : Total 6 Click Score: 11    End of Session Equipment Utilized During Treatment: Gait belt Activity Tolerance: Patient tolerated treatment well;Patient limited by pain Patient left: in chair;with call bell/phone within reach;with family/visitor present Nurse Communication: Mobility status PT Visit Diagnosis: Other abnormalities of  gait and mobility (R26.89)    Time: 1300-1330 PT Time Calculation (min) (ACUTE ONLY): 30 min   Charges:   PT Evaluation $PT Eval Low Complexity: 1 Procedure PT Treatments $Gait Training: 8-22 mins $Therapeutic Activity: 8-22 mins   PT G Codes:   PT G-Codes **NOT FOR INPATIENT CLASS** Functional Assessment Tool Used: AM-PAC 6 Clicks Basic Mobility;Clinical judgement Functional Limitation: Mobility: Walking and moving around Mobility: Walking and Moving Around Current Status (Z6109): At least 20 percent but less than 40 percent impaired, limited or restricted Mobility: Walking and Moving Around Goal Status [phone]): At least 1 percent but less than 20 percent impaired, limited or restricted    Marella Bile, PT Pager: [phone] 04/05/2017  Marella Bile 04/05/2017, 2:03 PM

## 2017-04-05 NOTE — [place] FL2 (Signed)
[place] MEDICAID FL2 LEVEL OF CARE SCREENING TOOL     IDENTIFICATION  Patient Name: Stephanie Jenkins Birthdate: 04-Apr-1923 Sex: female Admission Date (Current Location): 04/04/2017  [place]   Facility and Address:  [facility],  [address], [place] [id number]      Provider Number: [id number]  Attending Physician Name and Address:  Leatha GildingGherghe, Costin M, MD  Relative Name and Phone Number:       Current Level of Care: Hospital Recommended Level of Care: Skilled Nursing Facility Prior Approval Number:    Date Approved/Denied:   PASRR Number: [id number]  Discharge Plan: SNF    Current Diagnoses: Patient Active Problem List   Diagnosis Date Noted  . Pelvic fracture (HCC) 04/04/2017  . Impaired glucose tolerance 08/07/2012  . Osteoarthritis 07/28/2008  . HYPERCHOLESTEROLEMIA, MILD 07/27/2007  . Osteoporosis 07/14/2007    Orientation RESPIRATION BLADDER Height & Weight     Self, Time, Situation, Place  Normal Continent Weight: 155 lb (70.3 kg) Height:  4\' 8"  (142.2 cm)  BEHAVIORAL SYMPTOMS/MOOD NEUROLOGICAL BOWEL NUTRITION STATUS      Continent Diet (Regular)  AMBULATORY STATUS COMMUNICATION OF NEEDS Skin   Limited Assist Verbally Other (Comment) (Moisture associated skin damage to perineum)                       Personal Care Assistance Level of Assistance  Bathing, Dressing Bathing Assistance: Limited assistance   Dressing Assistance: Limited assistance     Functional Limitations Info  Hearing, Speech Sight Info: Adequate Hearing Info: Adequate Speech Info: Adequate    SPECIAL CARE FACTORS FREQUENCY  PT (By licensed PT), OT (By licensed OT)     PT Frequency: 5 OT Frequency: 5            Contractures Contractures Info: Not present    Additional Factors Info  Code Status, Insulin Sliding Scale Code Status Info: Full Allergies Info: NKA   Insulin Sliding Scale Info: Novolog TID with meals SS        Current Medications (04/05/2017):  This is the current hospital active medication list Current Facility-Administered Medications  Medication Dose Route Frequency Provider Last Rate Last Dose  . aspirin EC tablet 81 mg  81 mg Oral Daily Gherghe, Costin M, MD      . cefTRIAXone (ROCEPHIN) 1 g in dextrose 5 % 50 mL IVPB  1 g Intravenous Q24H Leatha GildingGherghe, Costin M, MD   Stopped at 04/05/17 1015  . enoxaparin (LOVENOX) injection 30 mg  30 mg Subcutaneous Q24H Pamella PertGherghe, Costin M, MD   30 mg at 04/04/17 2225  . hydrALAZINE (APRESOLINE) injection 5 mg  5 mg Intravenous Q4H PRN Leatha GildingGherghe, Costin M, MD      . HYDROcodone-acetaminophen (NORCO/VICODIN) 5-325 MG per tablet 1 tablet  1 tablet Oral Q6H PRN Leatha GildingGherghe, Costin M, MD   1 tablet at 04/05/17 1143  . insulin aspart (novoLOG) injection 0-9 Units  0-9 Units Subcutaneous TID WC Leatha GildingGherghe, Costin M, MD   2 Units at 04/05/17 1725  . meloxicam (MOBIC) tablet 15 mg  15 mg Oral Daily Leatha GildingGherghe, Costin M, MD   15 mg at 04/05/17 1016  . methocarbamol (ROBAXIN) tablet 500 mg  500 mg Oral Q6H PRN Leatha GildingGherghe, Costin M, MD   500 mg at 04/05/17 1143  . morphine 2 MG/ML injection 2 mg  2 mg Intravenous Q4H PRN Leatha GildingGherghe, Costin M, MD   2 mg at 04/05/17 0647  . multivitamin  with minerals tablet 1 tablet  1 tablet Oral Daily Leatha Gilding, MD   1 tablet at 04/05/17 1016  . polyethylene glycol (MIRALAX / GLYCOLAX) packet 17 g  17 g Oral Daily Leatha Gilding, MD   17 g at 04/05/17 1017     Discharge Medications: Please see discharge summary for a list of discharge medications.  Relevant Imaging Results:  Relevant Lab Results:   Additional Information SSN  [id number]  Darylene Price, [place]

## 2017-04-06 LAB — GLUCOSE, CAPILLARY
GLUCOSE-CAPILLARY: 197 mg/dL — AB (ref 65–99)
Glucose-Capillary: 137 mg/dL — ABNORMAL HIGH (ref 65–99)
Glucose-Capillary: 138 mg/dL — ABNORMAL HIGH (ref 65–99)
Glucose-Capillary: 193 mg/dL — ABNORMAL HIGH (ref 65–99)

## 2017-04-06 LAB — HEMOGLOBIN A1C
HEMOGLOBIN A1C: 6 % — AB (ref 4.8–5.6)
MEAN PLASMA GLUCOSE: 126 mg/dL

## 2017-04-06 NOTE — Progress Notes (Signed)
PROGRESS NOTE  Stephanie Jenkins:[id number] DOB: [birth year] DOA: 04/04/2017 PCP: Gordy Savers, MD   LOS: 1 day   Brief Narrative / Interim history:  83 y.o. female with medical history significant of osteoporosis, mild hyperlipidemia, diet-controlled diabetes mellitus, and otherwise healthy presents to the emergency room with chief complaint of hip pain after suffering a fall.  She was found to have nondisplaced right inferior and superior pubic ramus fractures  Assessment & Plan: Active Problems:   HYPERCHOLESTEROLEMIA, MILD   Osteoporosis   Impaired glucose tolerance   Pelvic fracture (HCC)   Pubic rami fracture and right sacrum fracture -non displaced, conservative measures with pain control, muscle relaxer, physical therapy -She was somewhat dehydrated in the ED, this is improved after overnight hydration -Due to excruciating pain with minimal maneuvers this morning, obtain a CT scan of the right hip to rule out a fracture which would not have been visualized on plain film. CT scan was negative for hip fracture -Discussed over the phone with orthopedics on call Dr. August Saucer, she is to be weightbearing as tolerated on the left and partial weightbearing (50%) on the right.  No indication for surgery  UTI -Potentially cause of her weakness, urine cultures are pending, started ceftriaxone. . -Urine culture with 10 to the fifth E. coli, susceptibilities to follow  Diet-controlled diabetes mellitus -most recent hemoglobin A1c was 6.5 in 2012.  Obtain hemoglobin A1c, place patient on sliding scale insulin -CBGs this morning 130s-190s   DVT prophylaxis: Lovenox Code Status: DNR Family Communication: Discussed with son at bedside Disposition Plan:  SNF, likely 1 day   Consultants:   None   Procedures:   None   Antimicrobials:  Ceftriaxone 6/2 >>  Subjective: -She is doing okay this morning, sitting in the bedside chair, no pain unless she  moves  Objective: Vitals:   04/05/17 0646 04/05/17 1525 04/05/17 2049 04/06/17 0533  BP:  (!) 118/58 (!) 178/66 (!) 120/56  Pulse:  74 80 87  Resp:  16 18 18   Temp: 98.2 F (36.8 C) 99.3 F (37.4 C) 99.3 F (37.4 C) 97 F (36.1 C)  TempSrc: Oral Oral Oral Oral  SpO2:  97% 96% 97%  Weight:      Height:        Intake/Output Summary (Last 24 hours) at 04/06/17 1325 Last data filed at 04/06/17 0819  Gross per 24 hour  Intake             1740 ml  Output              200 ml  Net             1540 ml   Filed Weights   04/04/17 1347  Weight: 70.3 kg (155 lb)    Examination:  Vitals:   04/05/17 0646 04/05/17 1525 04/05/17 2049 04/06/17 0533  BP:  (!) 118/58 (!) 178/66 (!) 120/56  Pulse:  74 80 87  Resp:  16 18 18   Temp: 98.2 F (36.8 C) 99.3 F (37.4 C) 99.3 F (37.4 C) 97 F (36.1 C)  TempSrc: Oral Oral Oral Oral  SpO2:  97% 96% 97%  Weight:      Height:        Constitutional: No distress  Respiratory: clear to auscultation bilaterally, no wheezing, no crackles.  Cardiovascular: Regular rate and rhythm, no murmurs / rubs / gallops. No LE edema.   Data Reviewed: I have personally reviewed following labs and imaging studies  CBC:  Recent Labs Lab 04/04/17 1530 04/05/17 0621  WBC 12.0* 5.6  NEUTROABS 9.5*  --   HGB 11.4* 10.7*  HCT 33.0* 31.9*  MCV 95.1 95.8  PLT 234 226   Basic Metabolic Panel:  Recent Labs Lab 04/04/17 1530 04/05/17 0621  NA 134* 134*  K 4.4 4.6  CL 99* 102  CO2 27 25  GLUCOSE 136* 115*  BUN 25* 17  CREATININE 0.95 0.81  CALCIUM 8.8* 8.3*   GFR: Estimated Creatinine Clearance: 34.9 mL/min (by C-G formula based on SCr of 0.81 mg/dL). Liver Function Tests: No results for input(s): AST, ALT, ALKPHOS, BILITOT, PROT, ALBUMIN in the last 168 hours. No results for input(s): LIPASE, AMYLASE in the last 168 hours. No results for input(s): AMMONIA in the last 168 hours. Coagulation Profile: No results for input(s): INR, PROTIME  in the last 168 hours. Cardiac Enzymes: No results for input(s): CKTOTAL, CKMB, CKMBINDEX, TROPONINI in the last 168 hours. BNP (last 3 results) No results for input(s): PROBNP in the last 8760 hours. HbA1C:  Recent Labs  04/05/17 0621  HGBA1C 6.0*   CBG:  Recent Labs Lab 04/05/17 1133 04/05/17 1636 04/05/17 2155 04/06/17 0733 04/06/17 1129  GLUCAP 182* 174* 150* 137* 197*   Lipid Profile: No results for input(s): CHOL, HDL, LDLCALC, TRIG, CHOLHDL, LDLDIRECT in the last 72 hours. Thyroid Function Tests: No results for input(s): TSH, T4TOTAL, FREET4, T3FREE, THYROIDAB in the last 72 hours. Anemia Panel: No results for input(s): VITAMINB12, FOLATE, FERRITIN, TIBC, IRON, RETICCTPCT in the last 72 hours. Urine analysis:    Component Value Date/Time   COLORURINE YELLOW 04/04/2017 1610   APPEARANCEUR CLEAR 04/04/2017 1610   LABSPEC 1.006 04/04/2017 1610   PHURINE 7.0 04/04/2017 1610   GLUCOSEU NEGATIVE 04/04/2017 1610   HGBUR NEGATIVE 04/04/2017 1610   HGBUR 2+ 07/27/2007 0000   BILIRUBINUR NEGATIVE 04/04/2017 1610   KETONESUR NEGATIVE 04/04/2017 1610   PROTEINUR NEGATIVE 04/04/2017 1610   UROBILINOGEN 0.2 07/27/2007 0000   NITRITE NEGATIVE 04/04/2017 1610   LEUKOCYTESUR SMALL (A) 04/04/2017 1610   Sepsis Labs: Invalid input(s): PROCALCITONIN, LACTICIDVEN  Recent Results (from the past 240 hour(s))  Culture, Urine     Status: Abnormal (Preliminary result)   Collection Time: 04/04/17  4:10 PM  Result Value Ref Range Status   Specimen Description URINE, RANDOM  Final   Special Requests NONE  Final   Culture (A)  Final    >=100,000 COLONIES/mL ESCHERICHIA COLI SUSCEPTIBILITIES TO FOLLOW Performed at [facility], [address] [place], [place] [address]    Report Status PENDING  Incomplete      Radiology Studies: Ct Hip Right Wo Contrast  Result Date: 04/05/2017 CLINICAL DATA:  Right hip pain due to a fall yesterday. Pubic rami fractures by comparison  plain films. Initial encounter. EXAM: CT OF THE RIGHT HIP WITHOUT CONTRAST TECHNIQUE: Multidetector CT imaging of the right hip was performed according to the standard protocol. Multiplanar CT image reconstructions were also generated. COMPARISON:  Plain films right hip 04/04/2017. FINDINGS: Bones/Joint/Cartilage There is partial visualization of a nondisplaced fracture of the lateral aspect of the right sacrum. The patient has nondisplaced segmental fractures through the mid and inferior aspect of the right superior pubic ramus. Nondisplaced right inferior pubic ramus fracture is also identified. The right hip is located and no hip fracture is identified. Ligaments Suboptimally assessed by CT. Muscles and Tendons Intact. Soft tissues No right hip effusion. No acute intrapelvic abnormality. Prominent stool in the rectosigmoid colon is  noted. IMPRESSION: Nondisplaced fractures of the periphery of the right sacrum and the right superior and inferior pubic rami. Negative for hip fracture. Prominent rectosigmoid stool burden. Electronically Signed   By: Drusilla Kanner M.D.   On: 04/05/2017 10:04   Dg Hip Unilat  With Pelvis 2-3 Views Right  Result Date: 04/04/2017 CLINICAL DATA:  Fall in gym today while walking. Right hip pain. Initial encounter. EXAM: DG HIP (WITH OR WITHOUT PELVIS) 2-3V RIGHT COMPARISON:  None. FINDINGS: Subtle but convincing nondisplaced fractures involving the superior and inferior right pubic rami. No visible sacrum fracture. The right hip is located and appears intact. Osteopenia. Lumbar scoliosis. IMPRESSION: Nondisplaced right inferior and superior pubic ramus fractures. Electronically Signed   By: Marnee Spring M.D.   On: 04/04/2017 15:03     Scheduled Meds: . aspirin EC  81 mg Oral Daily  . enoxaparin (LOVENOX) injection  30 mg Subcutaneous Q24H  . insulin aspart  0-9 Units Subcutaneous TID WC  . meloxicam  15 mg Oral Daily  . multivitamin with minerals  1 tablet Oral Daily  .  polyethylene glycol  17 g Oral Daily   Continuous Infusions: . cefTRIAXone (ROCEPHIN)  IV Stopped (04/06/17 1610)    Pamella Pert, MD, PhD [facility] Pager [phone]  If 7PM-7AM, please contact night-coverage [email or web address] Password [email or web address] 04/06/2017, 1:25 PM

## 2017-04-06 NOTE — Clinical Social Work Note (Signed)
Clinical Social Work Assessment  Patient Details  Name: Stephanie Jenkins MRN: [id number] Date of Birth: 1923-05-23  Date of referral:  04/06/17               Reason for consult:  Facility Placement                Permission sought to share information with:  Family Supports Permission granted to share information::  Yes, Verbal Permission Granted  Name::     son Rip Harbour [phone], daughter Hoyle Sauer [phone]  Agency::     Relationship::     Contact Information:     Housing/Transportation Living arrangements for the past 2 months:  Single Family Home Source of Information:  Patient, Adult Children Patient Interpreter Needed:  None Criminal Activity/Legal Involvement Pertinent to Current Situation/Hospitalization:  No - Comment as needed Significant Relationships:  Adult Children, Other Family Members Lives with:  Self Do you feel safe going back to the place where you live?  Yes Need for family participation in patient care:  No (Coment)  Care giving concerns:  Pt from home where she lives alone. Prior to hospitalization (fracture from recent fall), pt was independent with ambulating and ADLs. Active with walking every day. Daughter and family live nearby and are working to get pt's house accommodated for what pt's needs will be while recovering. Both daughter and pt state after PT eval they feel short term rehab is best option for pt.   Social Worker assessment / plan:  CSW consulted for potential SNF placement for ST rehab. Met with pt and daughter at bedside, explained role. Receptive to SNF referrals. No bed preference.  FL2/passr completed by previous weekend coverage CSW. Referred to area facilities.  Plan: SNF at DC. Will follow up with pt/family for bed offers.   Employment status:  Retired Nurse, adult PT Recommendations:  [place] / Referral to community resources:  [place]  Patient/Family's  Response to care:  Both pt and daughter expressed appreciation for care received   Patient/Family's Understanding of and Emotional Response to Diagnosis, Current Treatment, and Prognosis:  Pt and daughter express disappointment that pt has experienced setback due to her recent fall, however they also state how grateful they are that she has experienced good health and so much independence thus far in her life. Both demonstrate positive outlook toward rehab and feel confident pt will be able to progress in therapy and return home after ST rehab.   Emotional Assessment Appearance:  Appears stated age Attitude/Demeanor/Rapport:   (pleasant, engaged) Affect (typically observed):  Accepting, Calm Orientation:  Oriented to Self, Oriented to Place, Oriented to  Time, Oriented to Situation Alcohol / Substance use:  Not Applicable Psych involvement (Current and /or in the community):  No (Comment)  Discharge Needs  Concerns to be addressed:  Discharge Planning Concerns Readmission within the last 30 days:  No Current discharge risk:  Dependent with Mobility, Lives alone Barriers to Discharge:  Continued Medical Work up   [employer], LCSW 04/06/2017, 10:27 AM Weekend coverage [phone]

## 2017-04-06 NOTE — Progress Notes (Addendum)
Provided SNF bed offers to pt and family- they select [facility]. CSW selected facility in the HUB and contacted admissions.  Daughter Eber JonesCarolyn will be scheduling time tomorrow to complete admission paperwork at facility prior to pt DC. Received authorization [phone] from [facility] [employer] (from on call representative Kim).  Will continue following to assist with transfer to SNF at DC.  Ilean SkillMeghan Evelean Bigler, MSW, LCSW Clinical Social Work 04/06/2017 [phone]

## 2017-04-07 DIAGNOSIS — S3282XD Multiple fractures of pelvis without disruption of pelvic ring, subsequent encounter for fracture with routine healing: Secondary | ICD-10-CM | POA: Diagnosis not present

## 2017-04-07 DIAGNOSIS — S72009A Fracture of unspecified part of neck of unspecified femur, initial encounter for closed fracture: Secondary | ICD-10-CM | POA: Diagnosis not present

## 2017-04-07 DIAGNOSIS — M6281 Muscle weakness (generalized): Secondary | ICD-10-CM | POA: Diagnosis not present

## 2017-04-07 DIAGNOSIS — Z7409 Other reduced mobility: Secondary | ICD-10-CM | POA: Diagnosis not present

## 2017-04-07 DIAGNOSIS — E7800 Pure hypercholesterolemia, unspecified: Secondary | ICD-10-CM | POA: Diagnosis not present

## 2017-04-07 DIAGNOSIS — S3993XA Unspecified injury of pelvis, initial encounter: Secondary | ICD-10-CM | POA: Diagnosis not present

## 2017-04-07 DIAGNOSIS — E119 Type 2 diabetes mellitus without complications: Secondary | ICD-10-CM | POA: Diagnosis not present

## 2017-04-07 DIAGNOSIS — S32591A Other specified fracture of right pubis, initial encounter for closed fracture: Secondary | ICD-10-CM | POA: Diagnosis not present

## 2017-04-07 DIAGNOSIS — N390 Urinary tract infection, site not specified: Secondary | ICD-10-CM | POA: Diagnosis not present

## 2017-04-07 DIAGNOSIS — S329XXA Fracture of unspecified parts of lumbosacral spine and pelvis, initial encounter for closed fracture: Secondary | ICD-10-CM | POA: Diagnosis not present

## 2017-04-07 DIAGNOSIS — R41841 Cognitive communication deficit: Secondary | ICD-10-CM | POA: Diagnosis not present

## 2017-04-07 DIAGNOSIS — R7302 Impaired glucose tolerance (oral): Secondary | ICD-10-CM | POA: Diagnosis not present

## 2017-04-07 DIAGNOSIS — R2689 Other abnormalities of gait and mobility: Secondary | ICD-10-CM | POA: Diagnosis not present

## 2017-04-07 DIAGNOSIS — M810 Age-related osteoporosis without current pathological fracture: Secondary | ICD-10-CM | POA: Diagnosis not present

## 2017-04-07 LAB — GLUCOSE, CAPILLARY
Glucose-Capillary: 132 mg/dL — ABNORMAL HIGH (ref 65–99)
Glucose-Capillary: 115 mg/dL — ABNORMAL HIGH (ref 65–99)
Glucose-Capillary: 137 mg/dL — ABNORMAL HIGH (ref 65–99)

## 2017-04-07 LAB — URINE CULTURE

## 2017-04-07 MED ORDER — CIPROFLOXACIN HCL 500 MG PO TABS: 500.0000 mg | ORAL_TABLET | Freq: Two times a day (BID) | ORAL | 0 refills | Status: AC

## 2017-04-07 MED ORDER — HYDROCODONE-ACETAMINOPHEN 5-325 MG PO TABS: 1.0000 | ORAL_TABLET | Freq: Four times a day (QID) | ORAL | 0 refills | Status: DC | PRN

## 2017-04-07 NOTE — Care Management Note (Signed)
Case Management Note  Patient Details  Name: Stephanie Jenkins MRN: [id number] Date of Birth: 01-19-1923  Subjective/Objective:                    Action/Plan:d/c SNF.   Expected Discharge Date:  04/07/17               Expected Discharge Plan:  Skilled Nursing Facility  In-House Referral:  Clinical Social Work  Discharge planning Services  CM Consult  Post Acute Care Choice:    Choice offered to:     DME Arranged:    DME Agency:     HH Arranged:    HH Agency:     Status of Service:  Completed, signed off  If discussed at [employer] Meetings, dates discussed:    Additional Comments:  Lanier ClamMahabir, Brixton Schnapp, RN 04/07/2017, 2:01 PM

## 2017-04-07 NOTE — Progress Notes (Signed)
CSW sent discharge summary to [facility]. CSW followed up with staff at SNF, who confirmed documents received and patient's ability to arrive today 04/07/2017 at 1:30 pm.   CSW contacted patient's daughter to inquire about transportation for patient, patient's daughter requested PTAR. Patient aware and agreeable.   CSW will coordinate transportation for patient.   Celso SickleKimberly Yovan Leeman, [place] Clinical Social Worker [facility] Cell#: [phone]

## 2017-04-07 NOTE — Clinical Social Work Placement (Signed)
Patient received and accepted bed offer at [facility]. PTAR contacted, patient's family aware. Packet complete. Patient's RN can call report to [phone], patient going to room 3239.  CLINICAL SOCIAL WORK PLACEMENT  NOTE  Date:  04/07/2017  Patient Details  Name: Stephanie Jenkins MRN: [id number] Date of Birth: April 30, 1923  Clinical Social Work is seeking post-discharge placement for this patient at the Skilled  Nursing Facility level of care (*CSW will initial, date and re-position this form in  chart as items are completed):  Yes   Patient/family provided with [place] Clinical Social Work Department's list of facilities offering this level of care within the geographic area requested by the patient (or if unable, by the patient's family).  Yes   Patient/family informed of their freedom to choose among providers that offer the needed level of care, that participate in Medicare, Medicaid or managed care program needed by the patient, have an available bed and are willing to accept the patient.  Yes   Patient/family informed of [place]'s ownership interest in [facility] and [facility], as well as of the fact that they are under no obligation to receive care at these facilities.  PASRR submitted to EDS on 04/05/17     PASRR number received on 04/05/17     Existing PASRR number confirmed on       FL2 transmitted to all facilities in geographic area requested by pt/family on 04/06/17     FL2 transmitted to all facilities within larger geographic area on       Patient informed that his/her managed care company has contracts with or will negotiate with certain facilities, including the following:        Yes   Patient/family informed of bed offers received.  Patient chooses bed [facility]     Physician recommends and patient chooses bed at      [facility] 04[facility]07/[facility].  Patient to be transferred to  facility by PTAR     Patient family notified on 04/07/17 of transfer.  Name of family member notified:  Rosanna Randyarolyn Bean     PHYSICIAN       Additional Comment:    _______________________________________________ Antionette PolesKimberly L Preeti Winegardner, LCSW 04/07/2017, 12:33 PM

## 2017-04-07 NOTE — Discharge Summary (Signed)
Physician Discharge Summary  Stephanie Covertauline F Labrosse EAV:[id number] DOB: 04-06-1923 DOA: 04/04/2017  PCP: Gordy SaversKwiatkowski, Peter F, MD  Admit date: 04/04/2017 Discharge date: 04/07/2017  Admitted From: home Disposition:  SNF  Recommendations for Outpatient Follow-up:  1. Follow up with PCP in 3-4 weeks 2. Continue Ciprofloxacin for 2 additional days, start administering 6/5 stop date 6/7  Discharge Condition: stable CODE STATUS: DNR Diet recommendation: carb modified diet  HPI: Stephanie Jenkins is a 83 y.o. female with medical history significant of osteoporosis, mild hyperlipidemia, diet-controlled diabetes mellitus, and otherwise healthy presents to the emergency room with chief complaint of hip pain after suffering a fall.  Patient was out walking with her daughter and thinks she tripped on something on the street and landed on the ground.  She denies any syncope.  She denies any chest pain or palpitations.  She denies any loss of consciousness and remembers all the fall.  She is otherwise feeling at baseline, has had no fever or chills, no abdominal pain nausea vomiting or diarrhea.  She denies any dysuria.  In the emergency room her vital signs are stable, she is afebrile and normotensive, blood work is remarkable for sodium of 134, BUN 25 creatinine 0.9, white count of 12.  Imaging of the hips showed nondisplaced right inferior and superior pubic ramus fracture.  TRH is asked for admission for pain control/PT.  Hospital Course: Discharge Diagnoses:  Active Problems:   HYPERCHOLESTEROLEMIA, MILD   Osteoporosis   Impaired glucose tolerance   Pelvic fracture (HCC)   Pubic rami fracture and right sacrum fracture -non displaced, conservative measureswith pain control, muscle relaxer, physical therapy. Due to excruciating pain with minimal maneuvers this, we obtained a CT scan of the right hip to rule out a fracture which would not have been visualized on plain film. CT scan was negative for hip  fracture. Discussed over the phone with orthopedics on call Dr. August Saucerean, she is to be weightbearing as tolerated on the left and partial weightbearing (50%) on the right.  No indication for surgery UTI -Potentially cause of her weakness, urine cultures speciated E coli, started ceftriaxone and she was narrowed to ciprofloxacin based on susceptibilities.  Diet-controlled diabetes mellitus -most recent hemoglobin A1c was 6.0 on 04/05/2017. Continue carb modified diet   Discharge Instructions   Allergies as of 04/07/2017   No Known Allergies     Medication List    TAKE these medications   aspirin 81 MG tablet Take 81 mg by mouth daily.   ciprofloxacin 500 MG tablet Commonly known as:  CIPRO Take 1 tablet (500 mg total) by mouth 2 (two) times daily.   GLUCOSAMINE CHONDR 1500 COMPLX PO Take 1 tablet by mouth 2 (two) times daily.   HYDROcodone-acetaminophen 5-325 MG tablet Commonly known as:  NORCO/VICODIN Take 1 tablet by mouth every 6 (six) hours as needed for moderate pain.   meloxicam 15 MG tablet Commonly known as:  MOBIC TAKE 1 TABLET(15 MG) BY MOUTH DAILY   methocarbamol 500 MG tablet Commonly known as:  ROBAXIN Take 500-1,000 mg by mouth 2 (two) times daily as needed.   multivitamin tablet Take 1 tablet by mouth daily.   polyethylene glycol packet Commonly known as:  MIRALAX / GLYCOLAX Take 17 g by mouth daily.       Contact information for follow-up providers    Gordy SaversKwiatkowski, Peter F, MD. Schedule an appointment as soon as possible for a visit in 4 week(s).   Specialty:  Internal Medicine Contact information:  [address] [place] [address] [phone]            Contact information for after-discharge care    Destination    [facility] Follow up.   Specialty:  Skilled Nursing Facility Contact information: [address] [place] [address] [phone]                 No Known  Allergies  Consultations:  None   Procedures/Studies:  Ct Hip Right Wo Contrast  Result Date: 04/05/2017 CLINICAL DATA:  Right hip pain due to a fall yesterday. Pubic rami fractures by comparison plain films. Initial encounter. EXAM: CT OF THE RIGHT HIP WITHOUT CONTRAST TECHNIQUE: Multidetector CT imaging of the right hip was performed according to the standard protocol. Multiplanar CT image reconstructions were also generated. COMPARISON:  Plain films right hip 04/04/2017. FINDINGS: Bones/Joint/Cartilage There is partial visualization of a nondisplaced fracture of the lateral aspect of the right sacrum. The patient has nondisplaced segmental fractures through the mid and inferior aspect of the right superior pubic ramus. Nondisplaced right inferior pubic ramus fracture is also identified. The right hip is located and no hip fracture is identified. Ligaments Suboptimally assessed by CT. Muscles and Tendons Intact. Soft tissues No right hip effusion. No acute intrapelvic abnormality. Prominent stool in the rectosigmoid colon is noted. IMPRESSION: Nondisplaced fractures of the periphery of the right sacrum and the right superior and inferior pubic rami. Negative for hip fracture. Prominent rectosigmoid stool burden. Electronically Signed   By: Drusilla Kanner M.D.   On: 04/05/2017 10:04   Dg Hip Unilat  With Pelvis 2-3 Views Right  Result Date: 04/04/2017 CLINICAL DATA:  Fall in gym today while walking. Right hip pain. Initial encounter. EXAM: DG HIP (WITH OR WITHOUT PELVIS) 2-3V RIGHT COMPARISON:  None. FINDINGS: Subtle but convincing nondisplaced fractures involving the superior and inferior right pubic rami. No visible sacrum fracture. The right hip is located and appears intact. Osteopenia. Lumbar scoliosis. IMPRESSION: Nondisplaced right inferior and superior pubic ramus fractures. Electronically Signed   By: Marnee Spring M.D.   On: 04/04/2017 15:03     Subjective: - no chest pain, shortness  of breath, no abdominal pain, nausea or vomiting.   Discharge Exam: Vitals:   04/06/17 2030 04/07/17 0434  BP: (!) 146/67 (!) 152/61  Pulse: (!) 102 74  Resp: 18 20  Temp: 99.8 F (37.7 C) 98.9 F (37.2 C)   Vitals:   04/06/17 0533 04/06/17 1500 04/06/17 2030 04/07/17 0434  BP: (!) 120/56 (!) 156/73 (!) 146/67 (!) 152/61  Pulse: 87 99 (!) 102 74  Resp: 18 18 18 20   Temp: 97 F (36.1 C) 99.6 F (37.6 C) 99.8 F (37.7 C) 98.9 F (37.2 C)  TempSrc: Oral Oral Oral Oral  SpO2: 97% 98% 97% 98%  Weight:      Height:        General: Pt is alert, awake, not in acute distress Cardiovascular: RRR, S1/S2 +, no rubs, no gallops Respiratory: CTA bilaterally, no wheezing, no rhonchi Abdominal: Soft, NT, ND, bowel sounds + Extremities: no edema, no cyanosis    The results of significant diagnostics from this hospitalization (including imaging, microbiology, ancillary and laboratory) are listed below for reference.     Microbiology: Recent Results (from the past 240 hour(s))  Culture, Urine     Status: Abnormal   Collection Time: 04/04/17  4:10 PM  Result Value Ref Range Status   Specimen Description URINE, RANDOM  Final   Special Requests NONE  Final   Culture >=100,000 COLONIES/mL ESCHERICHIA COLI (A)  Final   Report Status 04/07/2017 FINAL  Final   Organism ID, Bacteria ESCHERICHIA COLI (A)  Final      Susceptibility   Escherichia coli - MIC*    AMPICILLIN >=32 RESISTANT Resistant     CEFAZOLIN 16 SENSITIVE Sensitive     CEFTRIAXONE <=1 SENSITIVE Sensitive     CIPROFLOXACIN <=0.25 SENSITIVE Sensitive     GENTAMICIN 4 SENSITIVE Sensitive     IMIPENEM <=0.25 SENSITIVE Sensitive     NITROFURANTOIN <=16 SENSITIVE Sensitive     TRIMETH/SULFA <=20 SENSITIVE Sensitive     AMPICILLIN/SULBACTAM 16 INTERMEDIATE Intermediate     Extended ESBL NEGATIVE Sensitive     * >=100,000 COLONIES/mL ESCHERICHIA COLI     Labs: BNP (last 3 results) No results for input(s): BNP in the last  8760 hours. Basic Metabolic Panel:  Recent Labs Lab 04/04/17 1530 04/05/17 0621  NA 134* 134*  K 4.4 4.6  CL 99* 102  CO2 27 25  GLUCOSE 136* 115*  BUN 25* 17  CREATININE 0.95 0.81  CALCIUM 8.8* 8.3*   Liver Function Tests: No results for input(s): AST, ALT, ALKPHOS, BILITOT, PROT, ALBUMIN in the last 168 hours. No results for input(s): LIPASE, AMYLASE in the last 168 hours. No results for input(s): AMMONIA in the last 168 hours. CBC:  Recent Labs Lab 04/04/17 1530 04/05/17 0621  WBC 12.0* 5.6  NEUTROABS 9.5*  --   HGB 11.4* 10.7*  HCT 33.0* 31.9*  MCV 95.1 95.8  PLT 234 226   Cardiac Enzymes: No results for input(s): CKTOTAL, CKMB, CKMBINDEX, TROPONINI in the last 168 hours. BNP: Invalid input(s): POCBNP CBG:  Recent Labs Lab 04/06/17 0733 04/06/17 1129 04/06/17 1631 04/06/17 2017 04/07/17 0759  GLUCAP 137* 197* 138* 193* 115*   D-Dimer No results for input(s): DDIMER in the last 72 hours. Hgb A1c  Recent Labs  04/05/17 0621  HGBA1C 6.0*   Lipid Profile No results for input(s): CHOL, HDL, LDLCALC, TRIG, CHOLHDL, LDLDIRECT in the last 72 hours. Thyroid function studies No results for input(s): TSH, T4TOTAL, T3FREE, THYROIDAB in the last 72 hours.  Invalid input(s): FREET3 Anemia work up No results for input(s): VITAMINB12, FOLATE, FERRITIN, TIBC, IRON, RETICCTPCT in the last 72 hours. Urinalysis    Component Value Date/Time   COLORURINE YELLOW 04/04/2017 1610   APPEARANCEUR CLEAR 04/04/2017 1610   LABSPEC 1.006 04/04/2017 1610   PHURINE 7.0 04/04/2017 1610   GLUCOSEU NEGATIVE 04/04/2017 1610   HGBUR NEGATIVE 04/04/2017 1610   HGBUR 2+ 07/27/2007 0000   BILIRUBINUR NEGATIVE 04/04/2017 1610   KETONESUR NEGATIVE 04/04/2017 1610   PROTEINUR NEGATIVE 04/04/2017 1610   UROBILINOGEN 0.2 07/27/2007 0000   NITRITE NEGATIVE 04/04/2017 1610   LEUKOCYTESUR SMALL (A) 04/04/2017 1610   Sepsis Labs Invalid input(s): PROCALCITONIN,  WBC,   LACTICIDVEN Microbiology Recent Results (from the past 240 hour(s))  Culture, Urine     Status: Abnormal   Collection Time: 04/04/17  4:10 PM  Result Value Ref Range Status   Specimen Description URINE, RANDOM  Final   Special Requests NONE  Final   Culture >=100,000 COLONIES/mL ESCHERICHIA COLI (A)  Final   Report Status 04/07/2017 FINAL  Final   Organism ID, Bacteria ESCHERICHIA COLI (A)  Final      Susceptibility   Escherichia coli - MIC*    AMPICILLIN >=32 RESISTANT Resistant     CEFAZOLIN 16 SENSITIVE Sensitive  CEFTRIAXONE <=1 SENSITIVE Sensitive     CIPROFLOXACIN <=0.25 SENSITIVE Sensitive     GENTAMICIN 4 SENSITIVE Sensitive     IMIPENEM <=0.25 SENSITIVE Sensitive     NITROFURANTOIN <=16 SENSITIVE Sensitive     TRIMETH/SULFA <=20 SENSITIVE Sensitive     AMPICILLIN/SULBACTAM 16 INTERMEDIATE Intermediate     Extended ESBL NEGATIVE Sensitive     * >=100,000 COLONIES/mL ESCHERICHIA COLI     Time coordinating discharge: 40 minutes  SIGNED:  Pamella Pert, MD  [facility] 04/07/2017, 9:52 AM Pager [phone]  If 7PM-7AM, please contact night-coverage [email or web address] Password [email or web address]

## 2017-04-10 DIAGNOSIS — E119 Type 2 diabetes mellitus without complications: Secondary | ICD-10-CM | POA: Diagnosis not present

## 2017-04-10 DIAGNOSIS — S329XXA Fracture of unspecified parts of lumbosacral spine and pelvis, initial encounter for closed fracture: Secondary | ICD-10-CM | POA: Diagnosis not present

## 2017-04-10 DIAGNOSIS — M810 Age-related osteoporosis without current pathological fracture: Secondary | ICD-10-CM | POA: Diagnosis not present

## 2017-04-10 DIAGNOSIS — R2689 Other abnormalities of gait and mobility: Secondary | ICD-10-CM | POA: Diagnosis not present

## 2017-04-10 DIAGNOSIS — I10 Essential (primary) hypertension: Secondary | ICD-10-CM | POA: Diagnosis not present

## 2017-04-14 ENCOUNTER — Other Ambulatory Visit: Payer: PPO

## 2017-04-17 DIAGNOSIS — M6281 Muscle weakness (generalized): Secondary | ICD-10-CM | POA: Diagnosis not present

## 2017-04-22 ENCOUNTER — Other Ambulatory Visit: Payer: Self-pay | Admitting: *Deleted

## 2017-04-22 NOTE — Patient Outreach (Signed)
Incoming call from patient daughter, Rosanna RandyCarolyn Bean, she states patient gave [facility] brochure to her and she wanted to know what all the services we could provide, she states they already have [facility] in place for discharge.  RNCM reviewed [facility] program and reviewed difference between home care and care management services.  Daughter states at this time they are good with home care and private pay assistance.   RNCM instructed daughter to call RNCM or main office line after home care had finished if they felt they could benefit from [facility] care management, she agrees.   Plan to sign off. Alben SpittleMary E. Albertha GheeNiemczura, RN, BSN, CCM  Post Acute Chartered loss adjusterCare Coordinator [facility] [phone]) Business Cell  [phone]) Toll Free Office

## 2017-04-22 NOTE — Patient Outreach (Signed)
[facility]) Care Management  04/22/2017  SMANTHA BOAKYE 03/14/1923 [id number]  Met with patient at facility. She reports she is going home later this week.  She states she lives alone, her daughter lives in a home behind her and is very supportive.  Patient reports daughter assists with medication management and transportation. She states her daughter has found a private pay sitter to be with patient for a few weeks.   Patient thinks she has improved greatly since being at the facility.   RNCM reviewed [facility] program and left brochure for patient to review with her daughter.  Plan to sign off. Royetta Crochet. Laymond Purser, RN, BSN, [place] [phone]) Business Cell  [phone]) Toll Free Office

## 2017-04-28 ENCOUNTER — Telehealth: Payer: Self-pay | Admitting: Internal Medicine

## 2017-04-28 DIAGNOSIS — Z87440 Personal history of urinary (tract) infections: Secondary | ICD-10-CM | POA: Diagnosis not present

## 2017-04-28 DIAGNOSIS — M80051D Age-related osteoporosis with current pathological fracture, right femur, subsequent encounter for fracture with routine healing: Secondary | ICD-10-CM | POA: Diagnosis not present

## 2017-04-28 DIAGNOSIS — Z9181 History of falling: Secondary | ICD-10-CM | POA: Diagnosis not present

## 2017-04-28 DIAGNOSIS — Z7982 Long term (current) use of aspirin: Secondary | ICD-10-CM | POA: Diagnosis not present

## 2017-04-28 DIAGNOSIS — E119 Type 2 diabetes mellitus without complications: Secondary | ICD-10-CM | POA: Diagnosis not present

## 2017-04-28 NOTE — Telephone Encounter (Signed)
Please advise 

## 2017-04-28 NOTE — Telephone Encounter (Signed)
Pt would like to have a DNR form filled out.  You can call pts daughter when form is ready at [phone] (Cell for daughter Rosanna RandyCarolyn Bean).

## 2017-04-30 ENCOUNTER — Telehealth: Payer: Self-pay | Admitting: Internal Medicine

## 2017-04-30 NOTE — Telephone Encounter (Signed)
Okay for OT? ?

## 2017-04-30 NOTE — Telephone Encounter (Signed)
OK for verbal orders, please advise?

## 2017-04-30 NOTE — Telephone Encounter (Signed)
Stephanie Jenkins needs verbal order for OT twice a wk for 5 wks and then once a wk for 1 wk

## 2017-05-01 NOTE — Telephone Encounter (Signed)
Verbal orders given to jim voice mail.

## 2017-05-02 ENCOUNTER — Encounter: Payer: Self-pay | Admitting: Internal Medicine

## 2017-05-02 ENCOUNTER — Ambulatory Visit (INDEPENDENT_AMBULATORY_CARE_PROVIDER_SITE_OTHER): Payer: PPO | Admitting: Internal Medicine

## 2017-05-02 VITALS — BP 142/80 | HR 73 | Temp 98.6°F | Ht <= 58 in | Wt 156.6 lb

## 2017-05-02 DIAGNOSIS — R7302 Impaired glucose tolerance (oral): Secondary | ICD-10-CM | POA: Diagnosis not present

## 2017-05-02 DIAGNOSIS — R410 Disorientation, unspecified: Secondary | ICD-10-CM | POA: Diagnosis not present

## 2017-05-02 DIAGNOSIS — M818 Other osteoporosis without current pathological fracture: Secondary | ICD-10-CM | POA: Diagnosis not present

## 2017-05-02 DIAGNOSIS — S32301D Unspecified fracture of right ilium, subsequent encounter for fracture with routine healing: Secondary | ICD-10-CM | POA: Diagnosis not present

## 2017-05-02 LAB — POC URINALSYSI DIPSTICK (AUTOMATED)
BILIRUBIN UA: NEGATIVE
Glucose, UA: NEGATIVE
Ketones, UA: NEGATIVE
Leukocytes, UA: NEGATIVE
Nitrite, UA: NEGATIVE
PH UA: 6.5 (ref 5.0–8.0)
PROTEIN UA: NEGATIVE
RBC UA: NEGATIVE
SPEC GRAV UA: 1.01 (ref 1.010–1.025)
Urobilinogen, UA: 0.2 E.U./dL

## 2017-05-02 MED ORDER — HYDROCODONE-ACETAMINOPHEN 5-325 MG PO TABS: 1.0000 | ORAL_TABLET | Freq: Four times a day (QID) | ORAL | 0 refills | Status: DC | PRN

## 2017-05-02 NOTE — Patient Instructions (Addendum)
WE NOW OFFER   [facility] FAST TRACK!!!  SAME DAY Appointments for ACUTE CARE  Such as: Sprains, Injuries, cuts, abrasions, rashes, muscle pain, joint pain, back pain Colds, flu, sore throats, headache, allergies, cough, fever  Ear pain, sinus and eye infections Abdominal pain, nausea, vomiting, diarrhea, upset stomach Animal/insect bites  3 Easy Ways to Schedule: Walk-In Scheduling Call in scheduling Mychart Sign-up: [email or web address]   Discontinue Robaxin Take pain medications every 6 hours as needed Titrate MiraLAX to prevent constipation  Continue a liberal fluid intake

## 2017-05-02 NOTE — Progress Notes (Signed)
Subjective:    Patient ID: Stephanie Jenkins, female    DOB: [birth year], 83 y.o.   MRN: [id number]  HPI  Admit date: 04/04/2017 Discharge date: 04/07/2017  Admitted From: home Disposition:  SNF  Pubic rami fracture and right sacrum fracture -non displaced, conservative measureswith pain control, muscle relaxer, physical therapy. Due to excruciating pain with minimal maneuvers this, we obtained a CT scan of the right hip to rule out a fracture which would not have been visualized on plain film. CT scan was negative for hip fracture. Discussed over the phone with orthopedics on call Dr. August Saucer, she is to be weightbearing as tolerated on the left and partial weightbearing (50%)on the right.No indication for surgery UTI -Potentially cause of her weakness, urine cultures speciated E coli, started ceftriaxone and she was narrowed to ciprofloxacin based on susceptibilities.  Diet-controlled diabetes mellitus -most recent hemoglobin A1c was 6.0 on 04/05/2017. Continue carb modified diet  83 year old patient who is seen following a recent hospital discharge.  She was transferred to [facility] rehabilitation and has been home for about 6 days.  She has caregivers assisting 24/7 and seen today in pretty well.  Family is concerned about some slight increased confusion.  She was treated in the hospital for a UTI. She does have a history mild baseline dementia.  Her chief complaint today appears to be pain over the coccyx area.  Apparently this was fractured, as well as her pelvis  Past Medical History:  Diagnosis Date  . Carotid bruit    bilateral  . DEGENERATIVE JOINT DISEASE 07/28/2008  . DIABETES MELLITUS, TYPE II, CONTROLLED 07/27/2007  . HYPERCHOLESTEROLEMIA, MILD 07/27/2007  . OSTEOPOROSIS 07/14/2007     Social History   Social History  . Marital status: Widowed    Spouse name: N/A  . Number of children: N/A  . Years of education: N/A   Occupational History  . Not on file.   Social History  Main Topics  . Smoking status: Never Smoker  . Smokeless tobacco: Never Used  . Alcohol use No  . Drug use: No  . Sexual activity: Not on file   Other Topics Concern  . Not on file   Social History Narrative  . No narrative on file    Past Surgical History:  Procedure Laterality Date  . ABDOMINAL HYSTERECTOMY    . CATARACT EXTRACTION    . TONSILLECTOMY      No family history on file.  No Known Allergies  Current Outpatient Prescriptions on File Prior to Visit  Medication Sig Dispense Refill  . aspirin 81 MG tablet Take 81 mg by mouth daily.      . Glucosamine-Chondroit-Vit C-Mn (GLUCOSAMINE CHONDR 1500 COMPLX PO) Take 1 tablet by mouth 2 (two) times daily.     . meloxicam (MOBIC) 15 MG tablet TAKE 1 TABLET(15 MG) BY MOUTH DAILY 90 tablet 0  . Multiple Vitamin (MULTIVITAMIN) tablet Take 1 tablet by mouth daily.      . polyethylene glycol (MIRALAX / GLYCOLAX) packet Take 17 g by mouth daily.     No current facility-administered medications on file prior to visit.     BP (!) 142/80 (BP Location: Left Arm, Patient Position: Sitting, Cuff Size: Normal)   Pulse 73   Temp 98.6 F (37 C) (Oral)   Ht 4\' 8"  (1.422 m)   Wt 156 lb 9.6 oz (71 kg)   SpO2 99%   BMI 35.11 kg/m      Review of Systems  Constitutional:  Positive for activity change and fatigue.  HENT: Negative for congestion, dental problem, hearing loss, rhinorrhea, sinus pressure, sore throat and tinnitus.   Eyes: Negative for pain, discharge and visual disturbance.  Respiratory: Negative for cough and shortness of breath.   Cardiovascular: Negative for chest pain, palpitations and leg swelling.  Gastrointestinal: Negative for abdominal distention, abdominal pain, blood in stool, constipation, diarrhea, nausea and vomiting.  Genitourinary: Negative for difficulty urinating, dysuria, flank pain, frequency, hematuria, pelvic pain, urgency, vaginal bleeding, vaginal discharge and vaginal pain.  Musculoskeletal:  Positive for arthralgias and back pain. Negative for gait problem and joint swelling.  Skin: Negative for rash.  Neurological: Negative for dizziness, syncope, speech difficulty, weakness, numbness and headaches.  Hematological: Negative for adenopathy.  Psychiatric/Behavioral: Positive for confusion. Negative for agitation, behavioral problems and dysphoric mood. The patient is not nervous/anxious.        Objective:   Physical Exam  Constitutional: She is oriented to person, place, and time. She appears well-developed and well-nourished.  Appears slightly uncomfortable but in no acute distress.  O2 saturation 99%Pulse rate, normal  HENT:  Head: Normocephalic.  Right Ear: External ear normal.  Left Ear: External ear normal.  Mouth/Throat: Oropharynx is clear and moist.  Eyes: Conjunctivae and EOM are normal. Pupils are equal, round, and reactive to light.  Neck: Normal range of motion. Neck supple. No thyromegaly present.  Cardiovascular: Normal rate, regular rhythm, normal heart sounds and intact distal pulses.   Pulmonary/Chest: Effort normal and breath sounds normal.  Abdominal: Soft. Bowel sounds are normal. She exhibits no mass. There is no tenderness.  Musculoskeletal: Normal range of motion.  No tenderness over the coccyx with palpation  Lymphadenopathy:    She has no cervical adenopathy.  Neurological: She is alert and oriented to person, place, and time.  Skin: Skin is warm and dry. No rash noted.  Psychiatric: She has a normal mood and affect. Her behavior is normal.          Assessment & Plan:   Status post pelvic fracture.  We'll continue careful use of hydrocodone.  Robaxin will be discontinued.  We'll give acetaminophen every 8 hours and substitute Vicodin if needed for additional pain control Status post UTI.  Urinalysis unremarkable Osteoarthritis.  Continue Mobic  Follow-up 4 months  Stephanie Jenkins Stephanie Jenkins

## 2017-05-04 ENCOUNTER — Inpatient Hospital Stay (HOSPITAL_COMMUNITY)
Admission: EM | Admit: 2017-05-04 | Discharge: 2017-05-08 | DRG: 536 | Disposition: A | Discharge status: Home Health | Payer: PPO | Attending: Internal Medicine | Admitting: Internal Medicine

## 2017-05-04 ENCOUNTER — Encounter (HOSPITAL_COMMUNITY): Payer: Self-pay | Admitting: Emergency Medicine

## 2017-05-04 ENCOUNTER — Emergency Department (HOSPITAL_COMMUNITY): Payer: PPO

## 2017-05-04 DIAGNOSIS — E119 Type 2 diabetes mellitus without complications: Secondary | ICD-10-CM | POA: Diagnosis present

## 2017-05-04 DIAGNOSIS — Z8249 Family history of ischemic heart disease and other diseases of the circulatory system: Secondary | ICD-10-CM | POA: Diagnosis not present

## 2017-05-04 DIAGNOSIS — S329XXA Fracture of unspecified parts of lumbosacral spine and pelvis, initial encounter for closed fracture: Secondary | ICD-10-CM | POA: Diagnosis present

## 2017-05-04 DIAGNOSIS — S3210XA Unspecified fracture of sacrum, initial encounter for closed fracture: Secondary | ICD-10-CM | POA: Diagnosis present

## 2017-05-04 DIAGNOSIS — E7800 Pure hypercholesterolemia, unspecified: Secondary | ICD-10-CM | POA: Diagnosis present

## 2017-05-04 DIAGNOSIS — Z7982 Long term (current) use of aspirin: Secondary | ICD-10-CM | POA: Diagnosis not present

## 2017-05-04 DIAGNOSIS — Z66 Do not resuscitate: Secondary | ICD-10-CM | POA: Diagnosis not present

## 2017-05-04 DIAGNOSIS — S3282XA Multiple fractures of pelvis without disruption of pelvic ring, initial encounter for closed fracture: Secondary | ICD-10-CM | POA: Diagnosis not present

## 2017-05-04 DIAGNOSIS — S32591A Other specified fracture of right pubis, initial encounter for closed fracture: Secondary | ICD-10-CM | POA: Diagnosis not present

## 2017-05-04 DIAGNOSIS — Z90710 Acquired absence of both cervix and uterus: Secondary | ICD-10-CM

## 2017-05-04 DIAGNOSIS — M810 Age-related osteoporosis without current pathological fracture: Secondary | ICD-10-CM | POA: Diagnosis present

## 2017-05-04 DIAGNOSIS — M25551 Pain in right hip: Secondary | ICD-10-CM | POA: Diagnosis not present

## 2017-05-04 DIAGNOSIS — R0902 Hypoxemia: Secondary | ICD-10-CM | POA: Diagnosis not present

## 2017-05-04 DIAGNOSIS — S32511A Fracture of superior rim of right pubis, initial encounter for closed fracture: Principal | ICD-10-CM | POA: Diagnosis present

## 2017-05-04 DIAGNOSIS — D649 Anemia, unspecified: Secondary | ICD-10-CM | POA: Diagnosis present

## 2017-05-04 DIAGNOSIS — X58XXXA Exposure to other specified factors, initial encounter: Secondary | ICD-10-CM | POA: Diagnosis present

## 2017-05-04 DIAGNOSIS — S32301A Unspecified fracture of right ilium, initial encounter for closed fracture: Secondary | ICD-10-CM | POA: Diagnosis not present

## 2017-05-04 DIAGNOSIS — S3282XD Multiple fractures of pelvis without disruption of pelvic ring, subsequent encounter for fracture with routine healing: Secondary | ICD-10-CM | POA: Diagnosis not present

## 2017-05-04 DIAGNOSIS — R102 Pelvic and perineal pain: Secondary | ICD-10-CM | POA: Diagnosis not present

## 2017-05-04 LAB — URINALYSIS, ROUTINE W REFLEX MICROSCOPIC
BILIRUBIN URINE: NEGATIVE
Glucose, UA: NEGATIVE mg/dL
Hgb urine dipstick: NEGATIVE
KETONES UR: NEGATIVE mg/dL
Nitrite: NEGATIVE
PH: 7 (ref 5.0–8.0)
PROTEIN: NEGATIVE mg/dL
Specific Gravity, Urine: 1.01 (ref 1.005–1.030)

## 2017-05-04 LAB — CBC WITH DIFFERENTIAL/PLATELET
Basophils Absolute: 0 10*3/uL (ref 0.0–0.1)
Basophils Relative: 1 %
EOS ABS: 0.5 10*3/uL (ref 0.0–0.7)
EOS PCT: 7 %
HCT: 32.3 % — ABNORMAL LOW (ref 36.0–46.0)
HEMOGLOBIN: 11.1 g/dL — AB (ref 12.0–15.0)
LYMPHS ABS: 1.8 10*3/uL (ref 0.7–4.0)
Lymphocytes Relative: 24 %
MCH: 32.5 pg (ref 26.0–34.0)
MCHC: 34.4 g/dL (ref 30.0–36.0)
MCV: 94.4 fL (ref 78.0–100.0)
MONOS PCT: 14 %
Monocytes Absolute: 1 10*3/uL (ref 0.1–1.0)
Neutro Abs: 4.2 10*3/uL (ref 1.7–7.7)
Neutrophils Relative %: 56 %
PLATELETS: 302 10*3/uL (ref 150–400)
RBC: 3.42 MIL/uL — ABNORMAL LOW (ref 3.87–5.11)
RDW: 14.2 % (ref 11.5–15.5)
WBC: 7.5 10*3/uL (ref 4.0–10.5)

## 2017-05-04 LAB — BASIC METABOLIC PANEL
Anion gap: 7 (ref 5–15)
BUN: 19 mg/dL (ref 6–20)
CHLORIDE: 102 mmol/L (ref 101–111)
CO2: 24 mmol/L (ref 22–32)
CREATININE: 0.79 mg/dL (ref 0.44–1.00)
Calcium: 8.8 mg/dL — ABNORMAL LOW (ref 8.9–10.3)
GFR calc Af Amer: >60 mL/min (ref >60)
GFR calc non Af Amer: >60 mL/min (ref >60)
GLUCOSE: 135 mg/dL — AB (ref 65–99)
Potassium: 3.8 mmol/L (ref 3.5–5.1)
SODIUM: 133 mmol/L — AB (ref 135–145)

## 2017-05-04 LAB — CBG MONITORING, ED: GLUCOSE-CAPILLARY: 87 mg/dL (ref 65–99)

## 2017-05-04 MED ORDER — OXYCODONE HCL 5 MG PO TABS
5.0000 mg | ORAL_TABLET | Freq: Once | ORAL | Status: AC
Start: 2017-05-04 — End: 2017-05-04
  Administered 2017-05-04: 5 mg via ORAL
  Filled 2017-05-04: qty 1

## 2017-05-04 NOTE — ED Provider Notes (Signed)
Pt seen and evaluated. D/W [facility]. Pt with Sacral and rami fractures over 5 weeks ago. DC'd from Inpatient to [facility]. Came home 9 days ago.Per son she was "running around like a teenager" the first few days with a walker.   For last 7 days, increasing sacral pain, but no new falls. Nocturia and frequency, but no UTI per PCP. "Didn't do well" with hydrocodone"==agitated.  Pt's biggest c/o here is that bed is uncomfortable.   Plan repeat CT of pelvis. If o significant change, lan Dc. Trial low dose oxycodone (2.5mg ) prn.   Rolland PorterJames, Stephanie Moffitt, MD 05/04/17 2238

## 2017-05-04 NOTE — ED Triage Notes (Signed)
Brought in by EMS from home with c/o uncontrolled pain to right hip and "tail bone".  Pt has had recent short-term rehab for her right ilium fracture which she sustained  a month ago.   Pt was discharged home from the rehab facility on June 23.  Pt reports no relief from pain after taking Vicodin 5/325 mg as prescribed.

## 2017-05-04 NOTE — ED Provider Notes (Signed)
[facility]-EMERGENCY DEPT Provider Note   CSN: [id number] Arrival date & time: 05/04/17  1951     History   Chief Complaint Chief Complaint  Patient presents with  . Hip Pain    HPI Stephanie Jenkins is a 83 y.o. female who presents emergency department with chief complaint of sacral pain. One month ago she had a mechanical fall and had an inferior and superior right pubic ramus fracture with a right-sided sacral fracture. She is admitted and sent to  [facility] for Rehab. The patient was able to be discharged. About 9 days ago. At that time she was doing very well. Her family states that she was able to walk around her house very easily and did not have pain. She began developing increased urinary frequency, urgency and was getting out of her bed to urinate and night. Since that time she has had worsening sacral pain and has had difficulty getting out of her bed, she cannot find a comfortable position. They tried Norco, which makes her confused. The patient states that her pain is very bad. She denies any new falls. She is having significant difficulty ambulating even with assistance.  HPI  Past Medical History:  Diagnosis Date  . Carotid bruit    bilateral  . DEGENERATIVE JOINT DISEASE 07/28/2008  . DIABETES MELLITUS, TYPE II, CONTROLLED 07/27/2007  . HYPERCHOLESTEROLEMIA, MILD 07/27/2007  . OSTEOPOROSIS 07/14/2007    Patient Active Problem List   Diagnosis Date Noted  . Pelvic fracture (HCC) 04/04/2017  . Impaired glucose tolerance 08/07/2012  . Osteoarthritis 07/28/2008  . HYPERCHOLESTEROLEMIA, MILD 07/27/2007  . Osteoporosis 07/14/2007    Past Surgical History:  Procedure Laterality Date  . ABDOMINAL HYSTERECTOMY    . CATARACT EXTRACTION    . TONSILLECTOMY      OB History    No data available       Home Medications    Prior to Admission medications   Medication Sig Start Date End Date Taking? Authorizing Provider  aspirin 81 MG tablet Take 81 mg by mouth daily.     Yes  [provider]  Buchu-Junip-K Gluc-Pars-Uva Ur (WATER PILL/POTASSIUM) TABS Take 1 tablet by mouth daily.   Yes [provider]  Glucosamine-Chondroit-Vit C-Mn (GLUCOSAMINE CHONDR 1500 COMPLX PO) Take 1 tablet by mouth 2 (two) times daily.    Yes [provider]  HYDROcodone-acetaminophen (NORCO/VICODIN) 5-325 MG tablet Take 1 tablet by mouth every 6 (six) hours as needed for moderate pain. 05/02/17  Yes Gordy SaversKwiatkowski, Peter F, MD  meloxicam (MOBIC) 15 MG tablet TAKE 1 TABLET(15 MG) BY MOUTH DAILY 12/09/16  Yes Gordy SaversKwiatkowski, Peter F, MD  Multiple Vitamin (MULTIVITAMIN) tablet Take 1 tablet by mouth daily.     Yes [provider]  polyethylene glycol (MIRALAX / GLYCOLAX) packet Take 17 g by mouth daily as needed for moderate constipation.     [provider]    Family History History reviewed. No pertinent family history.  Social History Social History  Substance Use Topics  . Smoking status: Never Smoker  . Smokeless tobacco: Never Used  . Alcohol use No     Allergies   Patient has no known allergies.   Review of Systems Review of Systems  Ten systems reviewed and are negative for acute change, except as noted in the HPI.   Physical Exam Updated Vital Signs BP (!) 166/66   Pulse 88   Temp 98.3 F (36.8 C) (Oral)   Resp 18   SpO2 98%   Physical  Exam  Constitutional: She is oriented to person, place, and time. She appears well-developed and well-nourished. No distress.  HENT:  Head: Normocephalic and atraumatic.  Eyes: Conjunctivae are normal. No scleral icterus.  Neck: Normal range of motion.  Cardiovascular: Normal rate, regular rhythm and normal heart sounds.  Exam reveals no gallop and no friction rub.   No murmur heard. Pulmonary/Chest: Effort normal and breath sounds normal. No respiratory distress.  Abdominal: Soft. Bowel sounds are normal. She exhibits no distension and no mass. There is no tenderness. There is no guarding.    Musculoskeletal:  Neck tender to palpation of the sacrum. No midline spinal tenderness. Patient has significant pain with any movement.  Neurological: She is alert and oriented to person, place, and time.  Skin: Skin is warm and dry. She is not diaphoretic.  Psychiatric: Her behavior is normal.  Nursing note and vitals reviewed.    ED Treatments / Results  Labs (all labs ordered are listed, but only abnormal results are displayed) Labs Reviewed  CBC WITH DIFFERENTIAL/PLATELET - Abnormal; Notable for the following:       Result Value   RBC 3.42 (*)    Hemoglobin 11.1 (*)    HCT 32.3 (*)    All other components within normal limits  BASIC METABOLIC PANEL - Abnormal; Notable for the following:    Sodium 133 (*)    Glucose, Bld 135 (*)    Calcium 8.8 (*)    All other components within normal limits  URINALYSIS, ROUTINE W REFLEX MICROSCOPIC - Abnormal; Notable for the following:    Leukocytes, UA SMALL (*)    Bacteria, UA RARE (*)    Squamous Epithelial / LPF 0-5 (*)    All other components within normal limits  CBG MONITORING, ED    EKG  EKG Interpretation None       Radiology Ct Pelvis Wo Contrast  Result Date: 05/04/2017 CLINICAL DATA:  Worsening pain, recent pelvis and sacral fractures. EXAM: CT PELVIS WITHOUT CONTRAST TECHNIQUE: Multidetector CT imaging of the pelvis was performed following the standard protocol without intravenous contrast. COMPARISON:  Right hip CT 04/05/2017 FINDINGS: Urinary Tract: Urinary bladder minimally distended. Distal ureters are decompressed. Bowel: Decreased rectal stool burden from prior. No bowel inflammation. Vascular/Lymphatic: Vascular calcifications.  No adenopathy. Reproductive: Post hysterectomy. No adnexal mass. Peripherally calcified density in the right adnexa is stable and appears benign. Other:  There is presacral soft tissue edema. Musculoskeletal: Right superior pubic ramus fracture has some increase in displacement from prior  exam. There is some peripheral callus formation but no solid bony bridging. Minimal displacement of the right inferior pubic rami fractures with some surrounding callus, more robust than the superior ramus fracture. No acetabular extension. Peripheral right sacral fracture is unchanged from prior exam. There is nondisplaced left sacral fracture, previously not included in the field of view. No left pubic rami fractures. No sacroiliac joint widening. Bones appear under mineralized. IMPRESSION: 1. Right superior and inferior pubic rami fractures, with new mild displacement, previously nondisplaced. There is some incomplete callus formation, more exuberant about the inferior ramus fracture. 2. Unchanged alignment of right sacral fracture. 3. Left sacral fracture of uncertain acuity, previously not included in the field of view. Electronically Signed   By: Rubye Oaks M.D.   On: 05/04/2017 22:55    Procedures Procedures (including critical care time)  Medications Ordered in ED Medications  oxyCODONE (Oxy IR/ROXICODONE) immediate release tablet 5 mg (5 mg Oral Given 05/04/17 2245)  Initial Impression / Assessment and Plan / ED Course  I have reviewed the triage vital signs and the nursing notes.  Pertinent labs & imaging results that were available during my care of the patient were reviewed by me and considered in my medical decision making (see chart for details).     Patient with worsening pelvic fracture. Significant difficulty with ambulation, pain is uncontrolled and patient has difficult tolerance of opiates. I tried Percocet here without any relief of her symptoms. Her pubic rami fracture, nondisplaced. She has significant pain in her tailbone that is new since her discharge. She will need PT, OT evaluation, admission and placement in skilled nursing. Final Clinical Impressions(s) / ED Diagnoses   Final diagnoses:  Multiple closed fractures of pelvis without disruption of pelvic ring,  initial encounter [facility])    New Prescriptions New Prescriptions   No medications on file     Delos Haring 05/05/17 0123    Rolland Porter, MD 05/14/17 1301

## 2017-05-05 ENCOUNTER — Encounter (HOSPITAL_COMMUNITY): Payer: Self-pay | Admitting: Internal Medicine

## 2017-05-05 DIAGNOSIS — E119 Type 2 diabetes mellitus without complications: Secondary | ICD-10-CM | POA: Diagnosis present

## 2017-05-05 DIAGNOSIS — S3282XA Multiple fractures of pelvis without disruption of pelvic ring, initial encounter for closed fracture: Secondary | ICD-10-CM | POA: Diagnosis not present

## 2017-05-05 DIAGNOSIS — X58XXXA Exposure to other specified factors, initial encounter: Secondary | ICD-10-CM | POA: Diagnosis present

## 2017-05-05 DIAGNOSIS — S3210XA Unspecified fracture of sacrum, initial encounter for closed fracture: Secondary | ICD-10-CM | POA: Diagnosis present

## 2017-05-05 DIAGNOSIS — S32301A Unspecified fracture of right ilium, initial encounter for closed fracture: Secondary | ICD-10-CM

## 2017-05-05 DIAGNOSIS — Z90710 Acquired absence of both cervix and uterus: Secondary | ICD-10-CM | POA: Diagnosis not present

## 2017-05-05 DIAGNOSIS — E7800 Pure hypercholesterolemia, unspecified: Secondary | ICD-10-CM | POA: Diagnosis present

## 2017-05-05 DIAGNOSIS — S32591A Other specified fracture of right pubis, initial encounter for closed fracture: Secondary | ICD-10-CM | POA: Diagnosis present

## 2017-05-05 DIAGNOSIS — D649 Anemia, unspecified: Secondary | ICD-10-CM | POA: Diagnosis present

## 2017-05-05 DIAGNOSIS — S32511A Fracture of superior rim of right pubis, initial encounter for closed fracture: Secondary | ICD-10-CM | POA: Diagnosis present

## 2017-05-05 DIAGNOSIS — Z66 Do not resuscitate: Secondary | ICD-10-CM | POA: Diagnosis present

## 2017-05-05 DIAGNOSIS — S329XXA Fracture of unspecified parts of lumbosacral spine and pelvis, initial encounter for closed fracture: Secondary | ICD-10-CM

## 2017-05-05 DIAGNOSIS — S3282XD Multiple fractures of pelvis without disruption of pelvic ring, subsequent encounter for fracture with routine healing: Secondary | ICD-10-CM | POA: Diagnosis not present

## 2017-05-05 DIAGNOSIS — Z8249 Family history of ischemic heart disease and other diseases of the circulatory system: Secondary | ICD-10-CM | POA: Diagnosis not present

## 2017-05-05 DIAGNOSIS — M810 Age-related osteoporosis without current pathological fracture: Secondary | ICD-10-CM | POA: Diagnosis present

## 2017-05-05 DIAGNOSIS — R102 Pelvic and perineal pain: Secondary | ICD-10-CM | POA: Diagnosis present

## 2017-05-05 DIAGNOSIS — Z7982 Long term (current) use of aspirin: Secondary | ICD-10-CM | POA: Diagnosis not present

## 2017-05-05 HISTORY — DX: Fracture of unspecified parts of lumbosacral spine and pelvis, initial encounter for closed fracture: S32.9XXA

## 2017-05-05 LAB — BASIC METABOLIC PANEL
ANION GAP: 8 (ref 5–15)
BUN: 15 mg/dL (ref 6–20)
CO2: 25 mmol/L (ref 22–32)
Calcium: 8.6 mg/dL — ABNORMAL LOW (ref 8.9–10.3)
Chloride: 102 mmol/L (ref 101–111)
Creatinine, Ser: 0.61 mg/dL (ref 0.44–1.00)
GFR calc Af Amer: >60 mL/min (ref >60)
Glucose, Bld: 127 mg/dL — ABNORMAL HIGH (ref 65–99)
POTASSIUM: 3.6 mmol/L (ref 3.5–5.1)
SODIUM: 135 mmol/L (ref 135–145)

## 2017-05-05 LAB — CBC
HEMATOCRIT: 31.9 % — AB (ref 36.0–46.0)
Hemoglobin: 10.9 g/dL — ABNORMAL LOW (ref 12.0–15.0)
MCH: 32.2 pg (ref 26.0–34.0)
MCHC: 34.2 g/dL (ref 30.0–36.0)
MCV: 94.1 fL (ref 78.0–100.0)
Platelets: 316 10*3/uL (ref 150–400)
RBC: 3.39 MIL/uL — ABNORMAL LOW (ref 3.87–5.11)
RDW: 14.2 % (ref 11.5–15.5)
WBC: 6.5 10*3/uL (ref 4.0–10.5)

## 2017-05-05 LAB — GLUCOSE, CAPILLARY
GLUCOSE-CAPILLARY: 119 mg/dL — AB (ref 65–99)
GLUCOSE-CAPILLARY: 114 mg/dL — AB (ref 65–99)
GLUCOSE-CAPILLARY: 123 mg/dL — AB (ref 65–99)
GLUCOSE-CAPILLARY: 172 mg/dL — AB (ref 65–99)
Glucose-Capillary: 125 mg/dL — ABNORMAL HIGH (ref 65–99)

## 2017-05-05 MED ORDER — ONDANSETRON HCL 4 MG/2ML IJ SOLN: 4.0000 mg | Freq: Four times a day (QID) | INTRAMUSCULAR | Status: DC | PRN

## 2017-05-05 MED ORDER — ASPIRIN EC 81 MG PO TBEC
81.0000 mg | DELAYED_RELEASE_TABLET | Freq: Every day | ORAL | Status: DC
  Administered 2017-05-05 – 2017-05-08 (×4): 81 mg via ORAL
  Filled 2017-05-05 (×4): qty 1

## 2017-05-05 MED ORDER — MORPHINE SULFATE (PF) 4 MG/ML IV SOLN
0.5000 mg | INTRAVENOUS | Status: DC | PRN
Start: 2017-05-05 — End: 2017-05-05
  Administered 2017-05-05: 0.52 mg via INTRAVENOUS
  Filled 2017-05-05: qty 1

## 2017-05-05 MED ORDER — ACETAMINOPHEN 325 MG PO TABS
650.0000 mg | ORAL_TABLET | Freq: Four times a day (QID) | ORAL | Status: DC | PRN
  Administered 2017-05-07: 650 mg via ORAL
  Filled 2017-05-05: qty 2

## 2017-05-05 MED ORDER — OXYCODONE HCL 5 MG PO TABS
5.0000 mg | ORAL_TABLET | Freq: Four times a day (QID) | ORAL | Status: DC | PRN
  Administered 2017-05-05 – 2017-05-06 (×4): 5 mg via ORAL
  Filled 2017-05-05 (×4): qty 1

## 2017-05-05 MED ORDER — FENTANYL CITRATE (PF) 100 MCG/2ML IJ SOLN
25.0000 ug | INTRAMUSCULAR | Status: DC | PRN
  Administered 2017-05-05 – 2017-05-08 (×5): 25 ug via INTRAVENOUS
  Filled 2017-05-05 (×6): qty 2

## 2017-05-05 MED ORDER — METHOCARBAMOL 500 MG PO TABS
500.0000 mg | ORAL_TABLET | Freq: Three times a day (TID) | ORAL | Status: DC | PRN
  Administered 2017-05-05 – 2017-05-08 (×7): 500 mg via ORAL
  Filled 2017-05-05 (×7): qty 1

## 2017-05-05 MED ORDER — ONDANSETRON HCL 4 MG PO TABS: 4.0000 mg | ORAL_TABLET | Freq: Four times a day (QID) | ORAL | Status: DC | PRN

## 2017-05-05 MED ORDER — ACETAMINOPHEN 650 MG RE SUPP: 650.0000 mg | Freq: Four times a day (QID) | RECTAL | Status: DC | PRN

## 2017-05-05 MED ORDER — INSULIN ASPART 100 UNIT/ML ~~LOC~~ SOLN
0.0000 [IU] | Freq: Three times a day (TID) | SUBCUTANEOUS | Status: DC
  Administered 2017-05-05 – 2017-05-06 (×3): 1 [IU] via SUBCUTANEOUS
  Administered 2017-05-06: 2 [IU] via SUBCUTANEOUS
  Administered 2017-05-06 – 2017-05-07 (×2): 1 [IU] via SUBCUTANEOUS
  Administered 2017-05-07: 2 [IU] via SUBCUTANEOUS

## 2017-05-05 MED ORDER — ENOXAPARIN SODIUM 40 MG/0.4ML ~~LOC~~ SOLN
40.0000 mg | SUBCUTANEOUS | Status: DC
  Administered 2017-05-05 – 2017-05-08 (×4): 40 mg via SUBCUTANEOUS
  Filled 2017-05-05 (×4): qty 0.4

## 2017-05-05 NOTE — Evaluation (Signed)
Physical Therapy Evaluation Patient Details Name: Stephanie Jenkins MRN: [id number] DOB: 1923-08-22 Today's Date: 05/05/2017   History of Present Illness  83 year old female with recent admission for nondisplaced R inferior and superior pubic rami fractures (d/c to SNF and had been at home for a few days prior to this admission) admitted with increasing pain in the back and pelvis last few days after patient was ambulating. CT of the pelvis showed Right superior and inferior pubic rami fractures, with new mild  displacement, previously nondisplaced; Left sacral fracture of uncertain acuity  Clinical Impression  Pt admitted with above diagnosis. Pt currently with functional limitations due to the deficits listed below (see PT Problem List).  Pt will benefit from skilled PT to increase their independence and safety with mobility to allow discharge to the venue listed below.  Pt's daughter reports pt has 24/7 caregivers at home and she feels they can manage pt appropriately upon d/c (declines SNF).  She also states she is trying to obtain hospital bed for pt.  Pt only able to tolerate taking a couple steps with RW today due to increased pain.  Will continue to assist with mobility in acute setting, and daughter plans for resuming HHPT upon return home.     Follow Up Recommendations Home health PT;Supervision/Assistance - 24 hour    Equipment Recommendations  Hospital bed    Recommendations for Other Services       Precautions / Restrictions Precautions Precautions: Fall Restrictions Other Position/Activity Restrictions: WBAT       Mobility  Bed Mobility Overal bed mobility: Needs Assistance Bed Mobility: Supine to Sit;Sit to Supine     Supine to sit: Max assist;+2 for physical assistance Sit to supine: Max assist;+2 for physical assistance   General bed mobility comments: verbal cues for technique, complete assist for lower body, due to pain  Transfers Overall transfer level: Needs  assistance Equipment used: Rolling walker (2 wheeled) Transfers: Sit to/from Stand Sit to Stand: Min assist;+2 safety/equipment         General transfer comment: assist to rise and steady, cues for UE positioning  Ambulation/Gait Ambulation/Gait assistance: Min assist;+2 safety/equipment Ambulation Distance (Feet): 2 Feet Assistive device: Rolling walker (2 wheeled) Gait Pattern/deviations: Step-through pattern;Decreased stride length     General Gait Details: pt only able to tolerate taking a few steps forwards and then backwards, increased pain in back and L pelvis, increased cues for weight bearing on RW for better pain control  Stairs            Wheelchair Mobility    Modified Rankin (Stroke Patients Only)       Balance Overall balance assessment: History of Falls (daughter denies any falls since SNF)                                           Pertinent Vitals/Pain Pain Assessment: Faces Faces Pain Scale: Hurts even more Pain Location: L pelvis Pain Descriptors / Indicators: Sore;Grimacing;Guarding Pain Intervention(s): Limited activity within patient's tolerance;Monitored during session;Repositioned;Premedicated before session    Home Living Family/patient expects to be discharged to:: Private residence   Available Help at Discharge: Available 24 hours/day;Family;Personal care attendant Type of Home: House Home Access: Ramped entrance     Home Layout: One level Home Equipment: Environmental consultant - 2 wheels;Wheelchair - manual;Bedside commode      Prior Function Level of Independence: Independent  with assistive device(s)         Comments: ambulatory with RW after d/c home from SNF, has 24/7 caregivers     Hand Dominance        Extremity/Trunk Assessment        Lower Extremity Assessment Lower Extremity Assessment: Generalized weakness;LLE deficits/detail;RLE deficits/detail RLE Deficits / Details: able to move minimally against  gravity in bed, limited due to pain LLE Deficits / Details: able to move minimally against gravity in bed, limited due to pain       Communication   Communication: No difficulties  Cognition Arousal/Alertness: Awake/alert Behavior During Therapy: WFL for tasks assessed/performed Overall Cognitive Status: Within Functional Limits for tasks assessed                                        General Comments      Exercises     Assessment/Plan    PT Assessment Patient needs continued PT services  PT Problem List Decreased strength;Decreased mobility;Pain       PT Treatment Interventions DME instruction;Gait training;Therapeutic activities;Therapeutic exercise;Functional mobility training;Patient/family education    PT Goals (Current goals can be found in the Care Plan section)  Acute Rehab PT Goals Patient Stated Goal: home with 24/7 caregivers PT Goal Formulation: With patient/family Time For Goal Achievement: 05/12/17 Potential to Achieve Goals: Good    Frequency Min 3X/week   Barriers to discharge        Co-evaluation               AM-PAC PT "6 Clicks" Daily Activity  Outcome Measure Difficulty turning over in bed (including adjusting bedclothes, sheets and blankets)?: Total Difficulty moving from lying on back to sitting on the side of the bed? : Total Difficulty sitting down on and standing up from a chair with arms (e.g., wheelchair, bedside commode, etc,.)?: Total Help needed moving to and from a bed to chair (including a wheelchair)?: Total Help needed walking in hospital room?: Total Help needed climbing 3-5 steps with a railing? : Total 6 Click Score: 6    End of Session Equipment Utilized During Treatment: Gait belt Activity Tolerance: Patient limited by pain Patient left: in bed;with call bell/phone within reach;with family/visitor present Nurse Communication: Mobility status PT Visit Diagnosis: Difficulty in walking, not elsewhere  classified (R26.2)    Time: [id number] PT Time Calculation (min) (ACUTE ONLY): 29 min   Charges:   PT Evaluation $PT Eval Moderate Complexity: 1 Procedure     PT G Codes:   PT G-Codes **NOT FOR INPATIENT CLASS** Functional Assessment Tool Used: AM-PAC 6 Clicks Basic Mobility;Clinical judgement Functional Limitation: Mobility: Walking and moving around Mobility: Walking and Moving Around Current Status ([id number]([id number]): At least 80 percent but less than 100 percent impaired, limited or restricted Mobility: Walking and Moving Around Goal Status [phone](G8979): At least 40 percent but less than 60 percent impaired, limited or restricted   Zenovia JarredKati Marishka Rentfrow, PT, DPT 05/05/2017 Pager: [phone]   Maida SaleLEMYRE,KATHrine E 05/05/2017, 5:04 PM

## 2017-05-05 NOTE — Progress Notes (Signed)
PT Cancellation Note  Patient Details Name: Stephanie Jenkins MRN: [id number] DOB: 1922/11/18   Cancelled Treatment:    Reason Eval/Treat Not Completed: Other (comment) Possible ortho consult.  Please place weight bearing orders for PT to work with pt. Thank you!   Dreyson Mishkin,KATHrine E 05/05/2017, 10:54 AM Zenovia JarredKati Lucerito Rosinski, PT, DPT 05/05/2017  Pager: [phone]

## 2017-05-05 NOTE — Plan of Care (Signed)
Problem: Safety: Goal: Ability to remain free from injury will improve Outcome: Completed/Met Date Met: 05/05/17 Bed alarm set. Discussed safety prevention plan with family/pt

## 2017-05-05 NOTE — H&P (Signed)
History and Physical    Stephanie Jenkins GNF:[id number] DOB: 1923/07/15 DOA: 05/04/2017  PCP: Gordy Savers, MD  Patient coming from: Home.  Chief Complaint: Back and pelvic pain.  HPI: Stephanie Jenkins is a 83 y.o. female with history of diet-controlled diabetes mellitus who was recently admitted last month for pelvic fracture and discharged to [facility] for rehabilitation and was discharged 10 days ago to home from [facility] was found to have increasing pain in the back and pelvis last few days after patient was ambulating. Patient states she amylase with the help of a walker. She did fine at the rehabilitation and the pain started coming back again last few days.   ED Course: CT of the pelvis done in the ER shows right superior and inferior pubic primary fracture with new mild displacement and also left sacral fracture which was of uncertain acuity. Patient has significant pain on trying to ambulate and will be admitted for further observation and possibly will need rehabilitation placement. Patient denies any fall.  Review of Systems: As per HPI, rest all negative.   Past Medical History:  Diagnosis Date  . Carotid bruit    bilateral  . DEGENERATIVE JOINT DISEASE 07/28/2008  . DIABETES MELLITUS, TYPE II, CONTROLLED 07/27/2007  . HYPERCHOLESTEROLEMIA, MILD 07/27/2007  . OSTEOPOROSIS 07/14/2007    Past Surgical History:  Procedure Laterality Date  . ABDOMINAL HYSTERECTOMY    . CATARACT EXTRACTION    . TONSILLECTOMY       reports that she has never smoked. She has never used smokeless tobacco. She reports that she does not drink alcohol or use drugs.  No Known Allergies  Family History  Problem Relation Age of Onset  . Hypertension Other     Prior to Admission medications   Medication Sig Start Date End Date Taking? Authorizing Provider  aspirin 81 MG tablet Take 81 mg by mouth daily.     Yes [provider]  Buchu-Junip-K Gluc-Pars-Uva Ur (WATER  PILL/POTASSIUM) TABS Take 1 tablet by mouth daily.   Yes [provider]  Glucosamine-Chondroit-Vit C-Mn (GLUCOSAMINE CHONDR 1500 COMPLX PO) Take 1 tablet by mouth 2 (two) times daily.    Yes [provider]  HYDROcodone-acetaminophen (NORCO/VICODIN) 5-325 MG tablet Take 1 tablet by mouth every 6 (six) hours as needed for moderate pain. 05/02/17  Yes Gordy Savers, MD  meloxicam (MOBIC) 15 MG tablet TAKE 1 TABLET(15 MG) BY MOUTH DAILY 12/09/16  Yes Gordy Savers, MD  Multiple Vitamin (MULTIVITAMIN) tablet Take 1 tablet by mouth daily.     Yes [provider]  polyethylene glycol (MIRALAX / GLYCOLAX) packet Take 17 g by mouth daily as needed for moderate constipation.     [provider]    Physical Exam: Vitals:   05/04/17 2030 05/04/17 2100 05/04/17 2130 05/04/17 2320  BP: (!) 160/72 (!) 171/68 (!) 165/82 (!) 158/68  Pulse: 85 94  86  Resp:    18  Temp:      TempSrc:      SpO2: 99% 90%  98%      Constitutional: Moderately built and nourished. Vitals:   05/04/17 2030 05/04/17 2100 05/04/17 2130 05/04/17 2320  BP: (!) 160/72 (!) 171/68 (!) 165/82 (!) 158/68  Pulse: 85 94  86  Resp:    18  Temp:      TempSrc:      SpO2: 99% 90%  98%   Eyes: Anicteric no pallor. ENMT: No discharge from the ears eyes  nose and mouth. Neck: No mass felt. No JVD appreciated. Respiratory: No rhonchi or crepitations. Cardiovascular: S1-S2 heard no murmurs appreciated. Abdomen: Soft nontender bowel sounds present. Musculoskeletal: No edema no joint effusion. Skin: No rash. Skin appears warm. Neurologic: Alert awake oriented to time place and person. Moves all extremities. Psychiatric: Appears normal.   Labs on Admission: I have personally reviewed following labs and imaging studies  CBC:  Recent Labs Lab 05/04/17 2147  WBC 7.5  NEUTROABS 4.2  HGB 11.1*  HCT 32.3*  MCV 94.4  PLT 302   Basic Metabolic Panel:  Recent Labs Lab 05/04/17 2147   NA 133*  K 3.8  CL 102  CO2 24  GLUCOSE 135*  BUN 19  CREATININE 0.79  CALCIUM 8.8*   GFR: Estimated Creatinine Clearance: 35.6 mL/min (by C-G formula based on SCr of 0.79 mg/dL). Liver Function Tests: No results for input(s): AST, ALT, ALKPHOS, BILITOT, PROT, ALBUMIN in the last 168 hours. No results for input(s): LIPASE, AMYLASE in the last 168 hours. No results for input(s): AMMONIA in the last 168 hours. Coagulation Profile: No results for input(s): INR, PROTIME in the last 168 hours. Cardiac Enzymes: No results for input(s): CKTOTAL, CKMB, CKMBINDEX, TROPONINI in the last 168 hours. BNP (last 3 results) No results for input(s): PROBNP in the last 8760 hours. HbA1C: No results for input(s): HGBA1C in the last 72 hours. CBG:  Recent Labs Lab 05/04/17 2243  GLUCAP 87   Lipid Profile: No results for input(s): CHOL, HDL, LDLCALC, TRIG, CHOLHDL, LDLDIRECT in the last 72 hours. Thyroid Function Tests: No results for input(s): TSH, T4TOTAL, FREET4, T3FREE, THYROIDAB in the last 72 hours. Anemia Panel: No results for input(s): VITAMINB12, FOLATE, FERRITIN, TIBC, IRON, RETICCTPCT in the last 72 hours. Urine analysis:    Component Value Date/Time   COLORURINE YELLOW 05/04/2017 2100   APPEARANCEUR CLEAR 05/04/2017 2100   LABSPEC 1.010 05/04/2017 2100   PHURINE 7.0 05/04/2017 2100   GLUCOSEU NEGATIVE 05/04/2017 2100   HGBUR NEGATIVE 05/04/2017 2100   HGBUR 2+ 07/27/2007 0000   BILIRUBINUR NEGATIVE 05/04/2017 2100   BILIRUBINUR neg 05/02/2017 1110   KETONESUR NEGATIVE 05/04/2017 2100   PROTEINUR NEGATIVE 05/04/2017 2100   UROBILINOGEN 0.2 05/02/2017 1110   UROBILINOGEN 0.2 07/27/2007 0000   NITRITE NEGATIVE 05/04/2017 2100   LEUKOCYTESUR SMALL (A) 05/04/2017 2100   Sepsis Labs: @LABRCNTIP (procalcitonin:4,lacticidven:4) )No results found for this or any previous visit (from the past 240 hour(s)).   Radiological Exams on Admission: Ct Pelvis Wo Contrast  Result  Date: 05/04/2017 CLINICAL DATA:  Worsening pain, recent pelvis and sacral fractures. EXAM: CT PELVIS WITHOUT CONTRAST TECHNIQUE: Multidetector CT imaging of the pelvis was performed following the standard protocol without intravenous contrast. COMPARISON:  Right hip CT 04/05/2017 FINDINGS: Urinary Tract: Urinary bladder minimally distended. Distal ureters are decompressed. Bowel: Decreased rectal stool burden from prior. No bowel inflammation. Vascular/Lymphatic: Vascular calcifications.  No adenopathy. Reproductive: Post hysterectomy. No adnexal mass. Peripherally calcified density in the right adnexa is stable and appears benign. Other:  There is presacral soft tissue edema. Musculoskeletal: Right superior pubic ramus fracture has some increase in displacement from prior exam. There is some peripheral callus formation but no solid bony bridging. Minimal displacement of the right inferior pubic rami fractures with some surrounding callus, more robust than the superior ramus fracture. No acetabular extension. Peripheral right sacral fracture is unchanged from prior exam. There is nondisplaced left sacral fracture, previously not included in the field of view. No left pubic rami  fractures. No sacroiliac joint widening. Bones appear under mineralized. IMPRESSION: 1. Right superior and inferior pubic rami fractures, with new mild displacement, previously nondisplaced. There is some incomplete callus formation, more exuberant about the inferior ramus fracture. 2. Unchanged alignment of right sacral fracture. 3. Left sacral fracture of uncertain acuity, previously not included in the field of view. Electronically Signed   By: Rubye Oaks M.D.   On: 05/04/2017 22:55     Assessment/Plan Principal Problem:   Pelvic fracture (HCC) Active Problems:   Closed pelvic fracture (HCC)    1. Pelvic fracture with new displacement and possible new left sacral fracture - I have reviewed patient's recent chart and patient  was getting confused with Robaxin and hydrocodone. For now I have placed patient on minimal small dose of 0.5 mg of IV morphine for pain relief. Based on response consider low doses of pain relief medications. Physical therapy and social work has been consulted for rehabilitation placement. May discuss with orthopedics in a.m. since fracture is mildly displaced. 2. Diet-controlled diabetes mellitus - on car modified diet. Last hemoglobin A1c as per the chart was 6. 3. Normocytic normochromic anemia - hemoglobin appears to be at baseline. Follow CBC. Further workup as outpatient.   DVT prophylaxis: Lovenox. Code Status: DO NOT RESUSCITATE.  Family Communication: No family at the bedside.  Disposition Plan: Skilled nursing facility.  Consults called: Physical therapy and social work. Admission status: Observation.    Eduard Clos MD [facility] Pager [phone].  If 7PM-7AM, please contact night-coverage [email or web address] Password [facility]  05/05/2017, 12:48 AM

## 2017-05-05 NOTE — Progress Notes (Signed)
Patient seen and examined   83 y.o. female with history of diet-controlled diabetes mellitus who was recently admitted last month for pelvic fracture and discharged to [facility] for rehabilitation and was discharged 10 days ago to home from [facility] was found to have increasing pain in the back and pelvis last few days after patient was ambulating. CT of the pelvis showed Right superior and inferior pubic rami fractures, with new mild  displacement, previously nondisplaced. Patient primarily admitted for pain control

## 2017-05-06 LAB — CBC
HCT: 32.2 % — ABNORMAL LOW (ref 36.0–46.0)
Hemoglobin: 10.8 g/dL — ABNORMAL LOW (ref 12.0–15.0)
MCH: 31.4 pg (ref 26.0–34.0)
MCHC: 33.5 g/dL (ref 30.0–36.0)
MCV: 93.6 fL (ref 78.0–100.0)
PLATELETS: 320 10*3/uL (ref 150–400)
RBC: 3.44 MIL/uL — ABNORMAL LOW (ref 3.87–5.11)
RDW: 14.3 % (ref 11.5–15.5)
WBC: 5.4 10*3/uL (ref 4.0–10.5)

## 2017-05-06 LAB — COMPREHENSIVE METABOLIC PANEL
ALBUMIN: 3.3 g/dL — AB (ref 3.5–5.0)
ALT: 19 U/L (ref 14–54)
ANION GAP: 8 (ref 5–15)
AST: 23 U/L (ref 15–41)
Alkaline Phosphatase: 153 U/L — ABNORMAL HIGH (ref 38–126)
BILIRUBIN TOTAL: 0.3 mg/dL (ref 0.3–1.2)
BUN: 14 mg/dL (ref 6–20)
CHLORIDE: 102 mmol/L (ref 101–111)
CO2: 25 mmol/L (ref 22–32)
Calcium: 8.4 mg/dL — ABNORMAL LOW (ref 8.9–10.3)
Creatinine, Ser: 0.71 mg/dL (ref 0.44–1.00)
GFR calc Af Amer: >60 mL/min (ref >60)
GFR calc non Af Amer: >60 mL/min (ref >60)
GLUCOSE: 122 mg/dL — AB (ref 65–99)
POTASSIUM: 3.7 mmol/L (ref 3.5–5.1)
SODIUM: 135 mmol/L (ref 135–145)
TOTAL PROTEIN: 6.4 g/dL — AB (ref 6.5–8.1)

## 2017-05-06 LAB — GLUCOSE, CAPILLARY
GLUCOSE-CAPILLARY: 151 mg/dL — AB (ref 65–99)
Glucose-Capillary: 141 mg/dL — ABNORMAL HIGH (ref 65–99)
Glucose-Capillary: 137 mg/dL — ABNORMAL HIGH (ref 65–99)
Glucose-Capillary: 133 mg/dL — ABNORMAL HIGH (ref 65–99)

## 2017-05-06 MED ORDER — POLYETHYLENE GLYCOL 3350 17 G PO PACK: 17.0000 g | PACK | Freq: Two times a day (BID) | ORAL | 0 refills | Status: DC

## 2017-05-06 MED ORDER — POLYETHYLENE GLYCOL 3350 17 G PO PACK
17.0000 g | PACK | Freq: Every day | ORAL | Status: DC
  Administered 2017-05-06 – 2017-05-08 (×3): 17 g via ORAL
  Filled 2017-05-06 (×3): qty 1

## 2017-05-06 MED ORDER — DOCUSATE SODIUM 100 MG PO CAPS
100.0000 mg | ORAL_CAPSULE | Freq: Two times a day (BID) | ORAL | Status: DC
  Administered 2017-05-06 – 2017-05-08 (×5): 100 mg via ORAL
  Filled 2017-05-06 (×4): qty 1

## 2017-05-06 MED ORDER — FENTANYL 25 MCG/HR TD PT72: 25.0000 ug | MEDICATED_PATCH | TRANSDERMAL | 0 refills | Status: DC

## 2017-05-06 MED ORDER — OXYCODONE HCL 5 MG PO TABS
5.0000 mg | ORAL_TABLET | Freq: Four times a day (QID) | ORAL | Status: DC | PRN
  Administered 2017-05-06 (×3): 5 mg via ORAL
  Administered 2017-05-07 – 2017-05-08 (×4): 10 mg via ORAL
  Filled 2017-05-06 (×2): qty 1
  Filled 2017-05-06: qty 2
  Filled 2017-05-06: qty 1
  Filled 2017-05-06 (×3): qty 2

## 2017-05-06 MED ORDER — METHOCARBAMOL 500 MG PO TABS: 500.0000 mg | ORAL_TABLET | Freq: Three times a day (TID) | ORAL | 1 refills | Status: DC | PRN

## 2017-05-06 MED ORDER — FENTANYL 12 MCG/HR TD PT72
12.5000 ug | MEDICATED_PATCH | TRANSDERMAL | Status: DC
  Administered 2017-05-06: 12.5 ug via TRANSDERMAL
  Filled 2017-05-06: qty 1

## 2017-05-06 NOTE — Progress Notes (Signed)
Per chart review, PT reassessed patient and recommended HHPT. Insurance will not provide authorization for SNF placement due to HHPT recommendation. Per chart review, patient's daughter plans to take her home. CSW signing off, no other needs identified at this time. Please consult if new needs arise.   Celso SickleKimberly Koehn Salehi, [place]CS[place] Clinical Social Worker [facility] Cell#: [phone]

## 2017-05-06 NOTE — Plan of Care (Signed)
Problem: Pain Managment: Goal: General experience of comfort will improve Outcome: Progressing Pain med schedule appears to be managing pts pain level.

## 2017-05-06 NOTE — Progress Notes (Signed)
Physical Therapy Treatment Patient Details Name: Stephanie Jenkins MRN: [id number] DOB: 1923-03-11 Today's Date: 05/06/2017    History of Present Illness Pt is a 83 year old female with recent admission for nondisplaced R inferior and superior pubic rami fractures (d/c to SNF and had been at home for a few days prior to admission). Pt was admitted with increasing pain in the back and pelvis the last few days after ambulating. CT of pelvis reveals right superior and inferior pubic rami fractures, with mild new displacement, previously nondisplaced; left sacral fracture of uncertain acuity     PT Comments    Pt tolerated treatment well and ambulated with reduced pain. Daughter expects pt to be discharged Thursday to home.  Follow Up Recommendations  Home health PT;Supervision/Assistance - 24 hour     Equipment Recommendations  Hospital bed    Recommendations for Other Services       Precautions / Restrictions Precautions Precautions: Fall Restrictions Other Position/Activity Restrictions: WBAT    Mobility  Bed Mobility Overal bed mobility: Needs Assistance Bed Mobility: Supine to Sit     Supine to sit: Min assist     General bed mobility comments: Verbal cues given for technique, pt. showed improvement and increased tolerance during bed mobility  Transfers Overall transfer level: Needs assistance Equipment used: Rolling walker (2 wheeled) Transfers: Sit to/from Stand Sit to Stand: Min guard         General transfer comment: Verbal cues given for technique, pt showed improvement and increased tolerance during transfer  Ambulation/Gait Ambulation/Gait assistance: Min guard Ambulation Distance (Feet): 60 Feet Assistive device: Rolling walker (2 wheeled) Gait Pattern/deviations: Step-through pattern;Decreased stride length     General Gait Details: Pt ambulated 60 feet before reporting pain and fatigue. Pt reports pain increasing to 6/10 during ambulation. Pt able to  tolerate increased distance and reports feeling better after activity.   Stairs            Wheelchair Mobility    Modified Rankin (Stroke Patients Only)       Balance Overall balance assessment: History of Falls                                          Cognition Arousal/Alertness: Awake/alert Behavior During Therapy: WFL for tasks assessed/performed Overall Cognitive Status: Within Functional Limits for tasks assessed                                        Exercises      General Comments        Pertinent Vitals/Pain Pain Assessment: 0-10 Pain Score: 6  Pain Location: L pelvis  Pain Descriptors / Indicators: Sore;Discomfort Pain Intervention(s): Repositioned;Limited activity within patient's tolerance;Premedicated before session    Home Living                      Prior Function            PT Goals (current goals can now be found in the care plan section) Acute Rehab PT Goals Patient Stated Goal: Less pain while ambulating  PT Goal Formulation: With patient/family Time For Goal Achievement: 05/12/17 Potential to Achieve Goals: Good Progress towards PT goals: Progressing toward goals    Frequency    Min 3X/week  PT Plan Current plan remains appropriate    Co-evaluation              AM-PAC PT "6 Clicks" Daily Activity  Outcome Measure  Difficulty turning over in bed (including adjusting bedclothes, sheets and blankets)?: A Lot Difficulty moving from lying on back to sitting on the side of the bed? : Total Difficulty sitting down on and standing up from a chair with arms (e.g., wheelchair, bedside commode, etc,.)?: Total Help needed moving to and from a bed to chair (including a wheelchair)?: A Little Help needed walking in hospital room?: A Little Help needed climbing 3-5 steps with a railing? : Total 6 Click Score: 11    End of Session Equipment Utilized During Treatment: Gait  belt Activity Tolerance: Patient limited by pain;Patient limited by fatigue Patient left: in chair;with family/visitor present Nurse Communication: Mobility status PT Visit Diagnosis: History of falling (Z91.81);Difficulty in walking, not elsewhere classified (R26.2)     Time: 6045-4098 PT Time Calculation (min) (ACUTE ONLY): 20 min  Charges:  $Gait Training: 8-22 mins                    G Codes:       Stephanie Jenkins, SPT   Kathrene Bongo 05/06/2017, 3:34 PM

## 2017-05-06 NOTE — Clinical Social Work Note (Signed)
Clinical Social Work Assessment  Patient Details  Name: Stephanie Jenkins MRN: [id number] Date of Birth: 22-Nov-1922  Date of referral:  05/06/17               Reason for consult:  Facility Placement                Permission sought to share information with:  Facility Industrial/product designerContact Representative Permission granted to share information::  Yes, Verbal Permission Granted  Name::        Agency::     Relationship::     Contact Information:     Housing/Transportation Living arrangements for the past 2 months:  Single Family Home, [facility] Nursing [facility] Source of Information:  Adult Children, Patient Patient Interpreter Needed:  None Criminal Activity/Legal Involvement Pertinent to Current Situation/Hospitalization:  No - Comment as needed Significant Relationships:  Adult Children Lives with:  Facility Resident, Adult Children Do you feel safe going back to the place where you live?    Need for family participation in patient care:  Yes (Comment)  Care giving concerns:  Patient recently discharged from SNF after ST rehab. PT recommended HHPT with 24 hour supervision. Patient expressing concerns about returning home with current pain level.   Social Worker assessment / plan:  CSW spoke with patient/patient's children at bedside. Patient's daughter reported that the initial plan was to take patient home at discharge versus going to SNF. Patient's [facility] reported [facility] and that [facility] did not have a good experience. Patient/patient's daughter reported that they are open to a different SNF for ST rehab. CSW will complete FL2, start insurance authorization and assist with discharge planning.   Employment status:  Retired Database administratornsurance information:  Managed Medicare PT Recommendations:  Home with Home Health (PT will reassess patient) Information / Referral to community resources:  Skilled Nursing Facility  Patient/Family's  Response to care:  Patient/patient's daughter agreeable to consider SNF for ST rehab.  Patient/Family's Understanding of and Emotional Response to Diagnosis, Current Treatment, and Prognosis:  Patient appeared to be in an immense amount of pain and spoke very little during assessment. Patient's daughter verbalized understanding of diagnosis and expressed concerns about patient's pain,patient's RN notified doctor. Patient's daughter hopeful that patient's pain can be managed so patient can participate in PT and make progress.  Emotional Assessment Appearance:  Appears stated age Attitude/Demeanor/Rapport:    Affect (typically observed):  Hopeless Orientation:  Oriented to Self, Oriented to Place, Oriented to  Time, Oriented to Situation Alcohol / Substance use:  Not Applicable Psych involvement (Current and /or in the community):  No (Comment)  Discharge Needs  Concerns to be addressed:  No discharge needs identified Readmission within the last 30 days:  Yes Current discharge risk:  None Barriers to Discharge:  Insurance Authorization   Antionette PolesKimberly L Hetvi Shawhan, LCSW 05/06/2017, 12:31 PM

## 2017-05-06 NOTE — Discharge Summary (Addendum)
Physician Discharge Summary  Stephanie Jenkins MRN: [id number] DOB/AGE: 1922/11/28 83 y.o.  PCP: Marletta Lor, MD   Admit date: 05/04/2017 Discharge date: 05/06/2017  Discharge Diagnoses:    Principal Problem:   Pelvic fracture [facility]) Active Problems:   Closed pelvic fracture ([place])   Addendum Patient's pain worse after moving  with therapy, cancel discharge and patient will remain inpatient, will be discharged on Thursday, to home health versus SNF   Follow-up recommendations Follow-up with PCP in 3-5 days , including all  additional recommended appointments as below Follow-up CBC, CMP in 3-5 days        Current Discharge Medication List    START taking these medications   Details  fentaNYL (DURAGESIC) 25 MCG/HR patch Place 1 patch (25 mcg total) onto the skin every 3 (three) days. Qty: 7 patch, Refills: 0    methocarbamol (ROBAXIN) 500 MG tablet Take 1 tablet (500 mg total) by mouth every 8 (eight) hours as needed for muscle spasms. Qty: 30 tablet, Refills: 1      CONTINUE these medications which have CHANGED   Details  polyethylene glycol (MIRALAX / GLYCOLAX) packet Take 17 g by mouth 2 (two) times daily. Qty: 14 each, Refills: 0      CONTINUE these medications which have NOT CHANGED   Details  aspirin 81 MG tablet Take 81 mg by mouth daily.      Buchu-Junip-K Gluc-Pars-Uva Ur (WATER PILL/POTASSIUM) TABS Take 1 tablet by mouth daily.    Glucosamine-Chondroit-Vit C-Mn (GLUCOSAMINE CHONDR 1500 COMPLX PO) Take 1 tablet by mouth 2 (two) times daily.     meloxicam (MOBIC) 15 MG tablet TAKE 1 TABLET(15 MG) BY MOUTH DAILY Qty: 90 tablet, Refills: 0    Multiple Vitamin (MULTIVITAMIN) tablet Take 1 tablet by mouth daily.        STOP taking these medications     HYDROcodone-acetaminophen (NORCO/VICODIN) 5-325 MG tablet          Discharge Condition: Overall prognosis guarded       No Known Allergies    Disposition: 03-Skilled Nursing  Facility   Consults:  None     Significant Diagnostic Studies:  Ct Pelvis Wo Contrast  Result Date: 05/04/2017 CLINICAL DATA:  Worsening pain, recent pelvis and sacral fractures. EXAM: CT PELVIS WITHOUT CONTRAST TECHNIQUE: Multidetector CT imaging of the pelvis was performed following the standard protocol without intravenous contrast. COMPARISON:  Right hip CT 04/05/2017 FINDINGS: Urinary Tract: Urinary bladder minimally distended. Distal ureters are decompressed. Bowel: Decreased rectal stool burden from prior. No bowel inflammation. Vascular/Lymphatic: Vascular calcifications.  No adenopathy. Reproductive: Post hysterectomy. No adnexal mass. Peripherally calcified density in the right adnexa is stable and appears benign. Other:  There is presacral soft tissue edema. Musculoskeletal: Right superior pubic ramus fracture has some increase in displacement from prior exam. There is some peripheral callus formation but no solid bony bridging. Minimal displacement of the right inferior pubic rami fractures with some surrounding callus, more robust than the superior ramus fracture. No acetabular extension. Peripheral right sacral fracture is unchanged from prior exam. There is nondisplaced left sacral fracture, previously not included in the field of view. No left pubic rami fractures. No sacroiliac joint widening. Bones appear under mineralized. IMPRESSION: 1. Right superior and inferior pubic rami fractures, with new mild displacement, previously nondisplaced. There is some incomplete callus formation, more exuberant about the inferior ramus fracture. 2. Unchanged alignment of right sacral fracture. 3. Left sacral fracture of uncertain acuity, previously not included in the  field of view. Electronically Signed   By: Jeb Levering M.D.   On: 05/04/2017 22:55      Filed Weights   05/05/17 0121  Weight: 69.5 kg (153 lb 3.5 oz)     Microbiology: No results found for this or any previous visit  (from the past 240 hour(s)).     Blood Culture    Component Value Date/Time   SDES URINE, RANDOM 04/04/2017 1610   SPECREQUEST NONE 04/04/2017 1610   CULT >=100,000 COLONIES/mL ESCHERICHIA COLI (A) 04/04/2017 1610   REPTSTATUS 04/07/2017 FINAL 04/04/2017 1610      Labs: Results for orders placed or performed during the hospital encounter of 05/04/17 (from the past 48 hour(s))  Urinalysis, Routine w reflex microscopic     Status: Abnormal   Collection Time: 05/04/17  9:00 PM  Result Value Ref Range   Color, Urine YELLOW YELLOW   APPearance CLEAR CLEAR   Specific Gravity, Urine 1.010 1.005 - 1.030   pH 7.0 5.0 - 8.0   Glucose, UA NEGATIVE NEGATIVE mg/dL   Hgb urine dipstick NEGATIVE NEGATIVE   Bilirubin Urine NEGATIVE NEGATIVE   Ketones, ur NEGATIVE NEGATIVE mg/dL   Protein, ur NEGATIVE NEGATIVE mg/dL   Nitrite NEGATIVE NEGATIVE   Leukocytes, UA SMALL (A) NEGATIVE   RBC / HPF 0-5 0 - 5 RBC/hpf   WBC, UA 0-5 0 - 5 WBC/hpf   Bacteria, UA RARE (A) NONE SEEN   Squamous Epithelial / LPF 0-5 (A) NONE SEEN   Mucous PRESENT    Hyaline Casts, UA PRESENT   CBC with Differential/Platelet     Status: Abnormal   Collection Time: 05/04/17  9:47 PM  Result Value Ref Range   WBC 7.5 4.0 - 10.5 K/uL   RBC 3.42 (L) 3.87 - 5.11 MIL/uL   Hemoglobin 11.1 (L) 12.0 - 15.0 g/dL   HCT 32.3 (L) 36.0 - 46.0 %   MCV 94.4 78.0 - 100.0 fL   MCH 32.5 26.0 - 34.0 pg   MCHC 34.4 30.0 - 36.0 g/dL   RDW 14.2 11.5 - 15.5 %   Platelets 302 150 - 400 K/uL   Neutrophils Relative % 56 %   Neutro Abs 4.2 1.7 - 7.7 K/uL   Lymphocytes Relative 24 %   Lymphs Abs 1.8 0.7 - 4.0 K/uL   Monocytes Relative 14 %   Monocytes Absolute 1.0 0.1 - 1.0 K/uL   Eosinophils Relative 7 %   Eosinophils Absolute 0.5 0.0 - 0.7 K/uL   Basophils Relative 1 %   Basophils Absolute 0.0 0.0 - 0.1 K/uL  Basic metabolic panel     Status: Abnormal   Collection Time: 05/04/17  9:47 PM  Result Value Ref Range   Sodium 133 (L) 135  - 145 mmol/L   Potassium 3.8 3.5 - 5.1 mmol/L   Chloride 102 101 - 111 mmol/L   CO2 24 22 - 32 mmol/L   Glucose, Bld 135 (H) 65 - 99 mg/dL   BUN 19 6 - 20 mg/dL   Creatinine, Ser 0.79 0.44 - 1.00 mg/dL   Calcium 8.8 (L) 8.9 - 10.3 mg/dL   GFR calc non Af Amer >60 >60 mL/min   GFR calc Af Amer >60 >60 mL/min    Comment: (NOTE) The eGFR has been calculated using the CKD EPI equation. This calculation has not been validated in all clinical situations. eGFR's persistently <60 mL/min signify possible Chronic Kidney Disease.    Anion gap 7 5 - 15  CBG monitoring,  ED     Status: None   Collection Time: 05/04/17 10:43 PM  Result Value Ref Range   Glucose-Capillary 87 65 - 99 mg/dL   Comment 1 Notify RN   Basic metabolic panel     Status: Abnormal   Collection Time: 05/05/17  5:23 AM  Result Value Ref Range   Sodium 135 135 - 145 mmol/L   Potassium 3.6 3.5 - 5.1 mmol/L   Chloride 102 101 - 111 mmol/L   CO2 25 22 - 32 mmol/L   Glucose, Bld 127 (H) 65 - 99 mg/dL   BUN 15 6 - 20 mg/dL   Creatinine, Ser 0.61 0.44 - 1.00 mg/dL   Calcium 8.6 (L) 8.9 - 10.3 mg/dL   GFR calc non Af Amer >60 >60 mL/min   GFR calc Af Amer >60 >60 mL/min    Comment: (NOTE) The eGFR has been calculated using the CKD EPI equation. This calculation has not been validated in all clinical situations. eGFR's persistently <60 mL/min signify possible Chronic Kidney Disease.    Anion gap 8 5 - 15  CBC     Status: Abnormal   Collection Time: 05/05/17  5:23 AM  Result Value Ref Range   WBC 6.5 4.0 - 10.5 K/uL   RBC 3.39 (L) 3.87 - 5.11 MIL/uL   Hemoglobin 10.9 (L) 12.0 - 15.0 g/dL   HCT 31.9 (L) 36.0 - 46.0 %   MCV 94.1 78.0 - 100.0 fL   MCH 32.2 26.0 - 34.0 pg   MCHC 34.2 30.0 - 36.0 g/dL   RDW 14.2 11.5 - 15.5 %   Platelets 316 150 - 400 K/uL  Glucose, capillary     Status: Abnormal   Collection Time: 05/05/17  5:24 AM  Result Value Ref Range   Glucose-Capillary 119 (H) 65 - 99 mg/dL  Glucose, capillary      Status: Abnormal   Collection Time: 05/05/17  8:10 AM  Result Value Ref Range   Glucose-Capillary 114 (H) 65 - 99 mg/dL  Glucose, capillary     Status: Abnormal   Collection Time: 05/05/17 12:07 PM  Result Value Ref Range   Glucose-Capillary 125 (H) 65 - 99 mg/dL  Glucose, capillary     Status: Abnormal   Collection Time: 05/05/17  4:44 PM  Result Value Ref Range   Glucose-Capillary 123 (H) 65 - 99 mg/dL  Glucose, capillary     Status: Abnormal   Collection Time: 05/05/17 11:09 PM  Result Value Ref Range   Glucose-Capillary 172 (H) 65 - 99 mg/dL  CBC     Status: Abnormal   Collection Time: 05/06/17  5:55 AM  Result Value Ref Range   WBC 5.4 4.0 - 10.5 K/uL   RBC 3.44 (L) 3.87 - 5.11 MIL/uL   Hemoglobin 10.8 (L) 12.0 - 15.0 g/dL   HCT 32.2 (L) 36.0 - 46.0 %   MCV 93.6 78.0 - 100.0 fL   MCH 31.4 26.0 - 34.0 pg   MCHC 33.5 30.0 - 36.0 g/dL   RDW 14.3 11.5 - 15.5 %   Platelets 320 150 - 400 K/uL  Comprehensive metabolic panel     Status: Abnormal   Collection Time: 05/06/17  5:55 AM  Result Value Ref Range   Sodium 135 135 - 145 mmol/L   Potassium 3.7 3.5 - 5.1 mmol/L   Chloride 102 101 - 111 mmol/L   CO2 25 22 - 32 mmol/L   Glucose, Bld 122 (H) 65 - 99 mg/dL  BUN 14 6 - 20 mg/dL   Creatinine, Ser 0.71 0.44 - 1.00 mg/dL   Calcium 8.4 (L) 8.9 - 10.3 mg/dL   Total Protein 6.4 (L) 6.5 - 8.1 g/dL   Albumin 3.3 (L) 3.5 - 5.0 g/dL   AST 23 15 - 41 U/L   ALT 19 14 - 54 U/L   Alkaline Phosphatase 153 (H) 38 - 126 U/L   Total Bilirubin 0.3 0.3 - 1.2 mg/dL   GFR calc non Af Amer >60 >60 mL/min   GFR calc Af Amer >60 >60 mL/min    Comment: (NOTE) The eGFR has been calculated using the CKD EPI equation. This calculation has not been validated in all clinical situations. eGFR's persistently <60 mL/min signify possible Chronic Kidney Disease.    Anion gap 8 5 - 15  Glucose, capillary     Status: Abnormal   Collection Time: 05/06/17  7:31 AM  Result Value Ref Range    Glucose-Capillary 141 (H) 65 - 99 mg/dL     Lipid Panel     Component Value Date/Time   CHOL 189 08/14/2015 0941   TRIG 79.0 08/14/2015 0941   HDL 64.80 08/14/2015 0941   CHOLHDL 3 08/14/2015 0941   VLDL 15.8 08/14/2015 0941   LDLCALC 109 (H) 08/14/2015 0941     Lab Results  Component Value Date   HGBA1C 6.0 (H) 04/05/2017   HGBA1C 6.5 08/07/2011   HGBA1C 6.3 08/03/2010        HPI :   83 y.o.femalewith history of diet-controlled diabetes mellitus who was recently admitted last month for pelvic fracture and discharged to [facility] for rehabilitation and was discharged 10 days ago to home from [facility] was found to have increasing pain in the back and pelvis last few days after patient was ambulating. CT of the pelvis showed Right superior and inferior pubic rami fractures, with new mild  displacement, previously nondisplaced. Patient primarily admitted for pain control  HOSPITAL COURSE:   1. Pelvic fracture with new displacement and possible new left sacral fracture -patient treated with Vicodin and oxycodone, which were the only medications which provided  her pain relief. Patient also started on fentanyl patch 25 g per hour due to severe pain. Increase oxycodone to 5-10 mg every 6 hours. Concomitantly started on aggressive constipation protocol. Physical therapy saw the patient and recommended SNF. Daughter stated that she could provide 24/ 7 care and wanted to take her mother home with home health. 2. Diet-controlled diabetes mellitus - on car modified diet. Last hemoglobin A1c as per the chart was 6. 3. Normocytic normochromic anemia - hemoglobin appears to be at baseline. Follow CBC. Further workup as outpatient.   Discharge Exam:   Blood pressure (!) 152/68, pulse 91, temperature 98.6 F (37 C), temperature source Oral, resp. rate (!) 26, height '4\' 8"'  (1.422 m), weight 69.5 kg (153 lb 3.5 oz), SpO2 95 %. Cardiovascular: S1-S2 heard no murmurs  appreciated. Abdomen: Soft nontender bowel sounds present. Musculoskeletal: No edema no joint effusion. Skin: No rash. Skin appears warm. Neurologic: Alert awake oriented to time place and person. Moves all extremities. Psychiatric: Appears normal     Follow-up Information    Marletta Lor, MD. Call.   Specialty:  Internal Medicine Why:  Hospital follow-up in 3-5 days Contact information: [place] [address] [phone]           Signed: Reyne Dumas 05/06/2017, 8:25 AM        Time spent >1  hour

## 2017-05-06 NOTE — Evaluation (Signed)
Occupational Therapy Evaluation Patient Details Name: Stephanie Jenkins MRN: [id number] DOB: Feb 22, 1923 Today's Date: 05/06/2017    History of Present Illness 83 year old female with recent admission for nondisplaced R inferior and superior pubic rami fractures (d/c to SNF and had been at home for a few days prior to this admission) admitted with increasing pain in the back and pelvis last few days after patient was ambulating. CT of the pelvis showed Right superior and inferior pubic rami fractures, with new mild  displacement, previously nondisplaced; Left sacral fracture of uncertain acuity   Clinical Impression   Pt admitted with pelvic fracture Pt currently with functional limitations due to the deficits listed below (see OT Problem List).  Pt will benefit from skilled OT to increase their safety and independence with ADL and functional mobility for ADL to facilitate discharge to venue listed below.      Follow Up Recommendations  SNF    Equipment Recommendations  None recommended by OT       Precautions / Restrictions Precautions Precautions: Fall Restrictions Other Position/Activity Restrictions: WBAT       Mobility Bed Mobility Overal bed mobility: Needs Assistance Bed Mobility: Supine to Sit;Sit to Supine     Supine to sit: Max assist Sit to supine: Max assist;+2 for physical assistance      Transfers Overall transfer level: Needs assistance Equipment used: 2 person hand held assist Transfers: Sit to/from Stand Sit to Stand: Min assist;Mod assist              Balance Overall balance assessment: History of Falls (daughter denies any falls since SNF)                                         ADL either performed or assessed with clinical judgement   ADL Overall ADL's : Needs assistance/impaired Eating/Feeding: Set up;Sitting   Grooming: Minimal assistance;Sitting           Upper Body Dressing : Minimal assistance;Sitting   Lower Body  Dressing: Maximal assistance;Sit to/from stand;Cueing for safety;Cueing for sequencing       Toileting- Clothing Manipulation and Hygiene: Maximal assistance;Sit to/from stand;Cueing for safety;Cueing for sequencing         General ADL Comments: Pt in significant pain. will likely need SNF as pt states family will not be able to care for her at this level     Vision Patient Visual Report: No change from baseline              Pertinent Vitals/Pain Pain Assessment: Faces Faces Pain Scale: Hurts worst Pain Location: L pelvis Pain Descriptors / Indicators: Sore;Grimacing;Guarding Pain Intervention(s): Repositioned;Patient requesting pain meds-RN notified;Limited activity within patient's tolerance;RN gave pain meds during session;Relaxation     Hand Dominance     Extremity/Trunk Assessment Upper Extremity Assessment Upper Extremity Assessment: Generalized weakness           Communication Communication Communication: No difficulties   Cognition Arousal/Alertness: Awake/alert Behavior During Therapy: WFL for tasks assessed/performed Overall Cognitive Status: Within Functional Limits for tasks assessed                                                Home Living Family/patient expects to be discharged to:: Private residence  Available Help at Discharge: Available 24 hours/day;Family;Personal care attendant Type of Home: House Home Access: Ramped entrance     Home Layout: One level     Bathroom Shower/Tub: Chief Strategy OfficerTub/shower unit   Bathroom Toilet: Standard     Home Equipment: Environmental consultantWalker - 2 wheels;Wheelchair - manual;Bedside commode          Prior Functioning/Environment Level of Independence: Independent with assistive device(s)        Comments: ambulatory with RW after d/c home from SNF, has 24/7 caregivers        OT Problem List: Decreased strength;Decreased activity tolerance;Decreased safety awareness;Decreased knowledge of use of DME or  AE      OT Treatment/Interventions: Self-care/ADL training;DME and/or AE instruction;Patient/family education    OT Goals(Current goals can be found in the care plan section) Acute Rehab OT Goals Patient Stated Goal: less pain OT Goal Formulation: With patient Time For Goal Achievement: 05/20/17 Potential to Achieve Goals: Good  OT Frequency: Min 2X/week   Barriers to D/C:               AM-PAC PT "6 Clicks" Daily Activity     Outcome Measure Help from another person eating meals?: A Little Help from another person taking care of personal grooming?: A Little Help from another person toileting, which includes using toliet, bedpan, or urinal?: A Lot Help from another person bathing (including washing, rinsing, drying)?: Total Help from another person to put on and taking off regular upper body clothing?: A Lot Help from another person to put on and taking off regular lower body clothing?: Total 6 Click Score: 12   End of Session Nurse Communication: Mobility status  Activity Tolerance: Patient tolerated treatment well Patient left: in bed;with call bell/phone within reach;with nursing/sitter in room  OT Visit Diagnosis: Unsteadiness on feet (R26.81);Repeated falls (R29.6)                Time: 1017-1040 OT Time Calculation (min): 23 min Charges:  OT General Charges $OT Visit: 1 Procedure OT Evaluation $OT Eval Moderate Complexity: 1 Procedure OT Treatments $Self Care/Home Management : 8-22 mins G-Codes:     Lise AuerLori Edelin Fryer, OT [phone]  Einar CrowEDDING, Dereck Agerton D 05/06/2017, 10:50 AM

## 2017-05-06 NOTE — Progress Notes (Signed)
CSW acknowledged consult for nursing home placement. Per chart review patient declines SNF and has 24/7 caregivers at home who feel they can manage appropriately. CSW signing off, no other needs identified. Please consult if new needs arise.   Celso SickleKimberly Aundreya Souffrant, [place] Clinical Social Worker [facility] Cell#: [phone]

## 2017-05-06 NOTE — Progress Notes (Signed)
[facility] PROGRESS NOTE  ADIVA BOETTNER AVW:[id number] DOB: Jul 16, 1923 DOA: 05/04/2017   PCP: Gordy Savers, MD     Assessment/Plan: Principal Problem:   Pelvic fracture [facility]) Active Problems:   Closed pelvic fracture (HCC)   83 y.o.femalewith history of diet-controlled diabetes mellitus who was recently admitted last month for pelvic fracture and discharged to [facility] for rehabilitation and was discharged 10 days ago to home from [facility] was found to have increasing pain in the back and pelvis last few days after patient was ambulating. CT of the pelvis showed Right superior and inferior pubic rami fractures, with new mild displacement, previously nondisplaced.Patient primarily admitted for pain control  HOSPITAL COURSE:   1. Pelvic fracture with new displacement and possible new left sacral fracture -patient treated with Vicodin and oxycodone, which were the only medications which provided  her pain relief. Patient also started on fentanyl patch 25 g per hour due to severe pain. Increase oxycodone to 5-10 mg every 6 hours. Concomitantly started on aggressive constipation protocol. Physical therapy saw the patient and recommended SNF. Daughter stated that she could provide 24/ 7 care and wanted to take her mother home with home health. Discussed patient's pain regimen in detail with the son and daughter and explained to them side effects of her current pain regimen which is not limited to somnolence, disorientation, constipation etc. Family desires better pain control, and  willing to risk the side effects of these narcotics  medications 2. Diet-controlled diabetes mellitus - on car modified diet. Last hemoglobin A1c as per the chart was 6. 3. Normocytic normochromic anemia - hemoglobin appears to be at baseline. Follow CBC. Further workup as outpatient.  DVT prophylaxsis Lovenox  Code Status:  Full code    Family Communication: Discussed in detail with  the patient/daughter/son, all imaging results, lab results explained to the patient   Disposition Plan:  Likely DC to SNF on Thursday once pain control is established    Consultants:  None  Procedures:  None  Antibiotics: Anti-infectives    None         HPI/Subjective: Complaining of excruciating pain with physical therapy*  Objective: Vitals:   05/05/17 0527 05/05/17 1310 05/05/17 2216 05/06/17 0517  BP: 116/62 (!) 117/57 (!) 108/91 (!) 152/68  Pulse: 92 (!) 109 97 91  Resp: 16 18 18  (!) 26  Temp: 99.5 F (37.5 C) 97.6 F (36.4 C) 98.7 F (37.1 C) 98.6 F (37 C)  TempSrc: Oral Oral Oral Oral  SpO2: 98% 98% 97% 95%  Weight:      Height:        Intake/Output Summary (Last 24 hours) at 05/06/17 1127 Last data filed at 05/06/17 0538  Gross per 24 hour  Intake                0 ml  Output              400 ml  Net             -400 ml    Exam:  Examination:  General exam: Elderly female,, moaning and groaning in pain Respiratory system: Clear to auscultation. Respiratory effort normal. Cardiovascular system: S1 & S2 heard, RRR. No JVD, murmurs, rubs, gallops or clicks. No pedal edema. Gastrointestinal system: Abdomen is nondistended, soft and nontender. No organomegaly or masses felt. Normal bowel sounds heard. Central nervous system: Alert and oriented. No focal neurological deficits. Extremities: Symmetric 5 x 5 power. Skin:  No rashes, lesions or ulcers Psychiatry: Judgement and insight appear normal. Mood & affect appropriate.     Data Reviewed: I have personally reviewed following labs and imaging studies  Micro Results No results found for this or any previous visit (from the past 240 hour(s)).  Radiology Reports Ct Pelvis Wo Contrast  Result Date: 05/04/2017 CLINICAL DATA:  Worsening pain, recent pelvis and sacral fractures. EXAM: CT PELVIS WITHOUT CONTRAST TECHNIQUE: Multidetector CT imaging of the pelvis was performed following the standard  protocol without intravenous contrast. COMPARISON:  Right hip CT 04/05/2017 FINDINGS: Urinary Tract: Urinary bladder minimally distended. Distal ureters are decompressed. Bowel: Decreased rectal stool burden from prior. No bowel inflammation. Vascular/Lymphatic: Vascular calcifications.  No adenopathy. Reproductive: Post hysterectomy. No adnexal mass. Peripherally calcified density in the right adnexa is stable and appears benign. Other:  There is presacral soft tissue edema. Musculoskeletal: Right superior pubic ramus fracture has some increase in displacement from prior exam. There is some peripheral callus formation but no solid bony bridging. Minimal displacement of the right inferior pubic rami fractures with some surrounding callus, more robust than the superior ramus fracture. No acetabular extension. Peripheral right sacral fracture is unchanged from prior exam. There is nondisplaced left sacral fracture, previously not included in the field of view. No left pubic rami fractures. No sacroiliac joint widening. Bones appear under mineralized. IMPRESSION: 1. Right superior and inferior pubic rami fractures, with new mild displacement, previously nondisplaced. There is some incomplete callus formation, more exuberant about the inferior ramus fracture. 2. Unchanged alignment of right sacral fracture. 3. Left sacral fracture of uncertain acuity, previously not included in the field of view. Electronically Signed   By: Rubye Oaks M.D.   On: 05/04/2017 22:55     CBC  Recent Labs Lab 05/04/17 2147 05/05/17 0523 05/06/17 0555  WBC 7.5 6.5 5.4  HGB 11.1* 10.9* 10.8*  HCT 32.3* 31.9* 32.2*  PLT 302 316 320  MCV 94.4 94.1 93.6  MCH 32.5 32.2 31.4  MCHC 34.4 34.2 33.5  RDW 14.2 14.2 14.3  LYMPHSABS 1.8  --   --   MONOABS 1.0  --   --   EOSABS 0.5  --   --   BASOSABS 0.0  --   --     Chemistries   Recent Labs Lab 05/04/17 2147 05/05/17 0523 05/06/17 0555  NA 133* 135 135  K 3.8 3.6 3.7   CL 102 102 102  CO2 24 25 25   GLUCOSE 135* 127* 122*  BUN 19 15 14   CREATININE 0.79 0.61 0.71  CALCIUM 8.8* 8.6* 8.4*  AST  --   --  23  ALT  --   --  19  ALKPHOS  --   --  153*  BILITOT  --   --  0.3   ------------------------------------------------------------------------------------------------------------------ estimated creatinine clearance is 35.1 mL/min (by C-G formula based on SCr of 0.71 mg/dL). ------------------------------------------------------------------------------------------------------------------ No results for input(s): HGBA1C in the last 72 hours. ------------------------------------------------------------------------------------------------------------------ No results for input(s): CHOL, HDL, LDLCALC, TRIG, CHOLHDL, LDLDIRECT in the last 72 hours. ------------------------------------------------------------------------------------------------------------------ No results for input(s): TSH, T4TOTAL, T3FREE, THYROIDAB in the last 72 hours.  Invalid input(s): FREET3 ------------------------------------------------------------------------------------------------------------------ No results for input(s): VITAMINB12, FOLATE, FERRITIN, TIBC, IRON, RETICCTPCT in the last 72 hours.  Coagulation profile No results for input(s): INR, PROTIME in the last 168 hours.  No results for input(s): DDIMER in the last 72 hours.  Cardiac Enzymes No results for input(s): CKMB, TROPONINI, MYOGLOBIN in the last 168 hours.  Invalid input(s): CK ------------------------------------------------------------------------------------------------------------------ Invalid input(s): POCBNP   CBG:  Recent Labs Lab 05/05/17 0810 05/05/17 1207 05/05/17 1644 05/05/17 2309 05/06/17 0731  GLUCAP 114* 125* 123* 172* 141*       Studies: Ct Pelvis Wo Contrast  Result Date: 05/04/2017 CLINICAL DATA:  Worsening pain, recent pelvis and sacral fractures. EXAM: CT PELVIS WITHOUT  CONTRAST TECHNIQUE: Multidetector CT imaging of the pelvis was performed following the standard protocol without intravenous contrast. COMPARISON:  Right hip CT 04/05/2017 FINDINGS: Urinary Tract: Urinary bladder minimally distended. Distal ureters are decompressed. Bowel: Decreased rectal stool burden from prior. No bowel inflammation. Vascular/Lymphatic: Vascular calcifications.  No adenopathy. Reproductive: Post hysterectomy. No adnexal mass. Peripherally calcified density in the right adnexa is stable and appears benign. Other:  There is presacral soft tissue edema. Musculoskeletal: Right superior pubic ramus fracture has some increase in displacement from prior exam. There is some peripheral callus formation but no solid bony bridging. Minimal displacement of the right inferior pubic rami fractures with some surrounding callus, more robust than the superior ramus fracture. No acetabular extension. Peripheral right sacral fracture is unchanged from prior exam. There is nondisplaced left sacral fracture, previously not included in the field of view. No left pubic rami fractures. No sacroiliac joint widening. Bones appear under mineralized. IMPRESSION: 1. Right superior and inferior pubic rami fractures, with new mild displacement, previously nondisplaced. There is some incomplete callus formation, more exuberant about the inferior ramus fracture. 2. Unchanged alignment of right sacral fracture. 3. Left sacral fracture of uncertain acuity, previously not included in the field of view. Electronically Signed   By: Rubye OaksMelanie  Ehinger M.D.   On: 05/04/2017 22:55      Lab Results  Component Value Date   HGBA1C 6.0 (H) 04/05/2017   HGBA1C 6.5 08/07/2011   HGBA1C 6.3 08/03/2010   Lab Results  Component Value Date   LDLCALC 109 (H) 08/14/2015   CREATININE 0.71 05/06/2017       Scheduled Meds: . aspirin EC  81 mg Oral Daily  . docusate sodium  100 mg Oral BID  . enoxaparin (LOVENOX) injection  40 mg  Subcutaneous Q24H  . fentaNYL  12.5 mcg Transdermal Q72H  . insulin aspart  0-9 Units Subcutaneous TID WC  . polyethylene glycol  17 g Oral Daily   Continuous Infusions:   LOS: 1 day    Time spent: >30 MINS    Richarda OverlieNayana Shellene Sweigert  [facility] Pager [phone]. If 7PM-7AM, please contact night-coverage at [email or web address], password [email or web address] 05/06/2017, 11:27 AM  LOS: 1 day

## 2017-05-06 NOTE — NC FL2 (Signed)
[place] MEDICAID FL2 LEVEL OF CARE SCREENING TOOL     IDENTIFICATION  Patient Name: Stephanie Jenkins Birthdate: [birth year] Sex: female Admission Date (Current Location): 05/04/2017  [facility] [place]   [facility] and Address:  [facility],  [address], [place] [id number]      Provider Number: [id number]  Attending Physician Name and Address:  Richarda OverlieAbrol, Nayana, MD  Relative Name and Phone Number:       Current Level of Care: Hospital Recommended Level of Care: Skilled Nursing Facility Prior Approval Number:    Date Approved/Denied:   PASRR Number: [id number]  Discharge Plan: SNF    Current Diagnoses: Patient Active Problem List   Diagnosis Date Noted  . Closed pelvic fracture (HCC) 05/05/2017  . Pelvic fracture (HCC) 04/04/2017  . Impaired glucose tolerance 08/07/2012  . Osteoarthritis 07/28/2008  . HYPERCHOLESTEROLEMIA, MILD 07/27/2007  . Osteoporosis 07/14/2007    Orientation RESPIRATION BLADDER Height & Weight     Self, Time, Situation, Place  Normal External catheter Weight: 153 lb 3.5 oz (69.5 kg) Height:  4\' 8"  (142.2 cm)  BEHAVIORAL SYMPTOMS/MOOD NEUROLOGICAL BOWEL NUTRITION STATUS        Diet  AMBULATORY STATUS COMMUNICATION OF NEEDS Skin   Limited Assist Verbally Normal                       Personal Care Assistance Level of Assistance  Bathing, Feeding, Dressing Bathing Assistance: Limited assistance Feeding assistance: Independent Dressing Assistance: Limited assistance     Functional Limitations Info             SPECIAL CARE FACTORS FREQUENCY  PT (By licensed PT), OT (By licensed OT)     PT Frequency: 5x OT Frequency: 5x            Contractures      Additional Factors Info  Code Status, Allergies Code Status Info: DNR Allergies Info: NKA           Current Medications (05/06/2017):  This is the current hospital active medication list Current Facility-Administered Medications   Medication Dose Route Frequency Provider Last Rate Last Dose  . acetaminophen (TYLENOL) tablet 650 mg  650 mg Oral Q6H PRN Eduard ClosKakrakandy, Arshad N, MD       Or  . acetaminophen (TYLENOL) suppository 650 mg  650 mg Rectal Q6H PRN Eduard ClosKakrakandy, Arshad N, MD      . aspirin EC tablet 81 mg  81 mg Oral Daily Eduard ClosKakrakandy, Arshad N, MD   81 mg at 05/06/17 1028  . docusate sodium (COLACE) capsule 100 mg  100 mg Oral BID Richarda OverlieAbrol, Nayana, MD   100 mg at 05/06/17 1214  . enoxaparin (LOVENOX) injection 40 mg  40 mg Subcutaneous Q24H Eduard ClosKakrakandy, Arshad N, MD   40 mg at 05/06/17 1028  . fentaNYL (DURAGESIC - dosed mcg/hr) 12.5 mcg  12.5 mcg Transdermal Q72H Richarda OverlieAbrol, Nayana, MD   12.5 mcg at 05/06/17 1213  . fentaNYL (SUBLIMAZE) injection 25 mcg  25 mcg Intravenous Q2H PRN Richarda OverlieAbrol, Nayana, MD   25 mcg at 05/06/17 1027  . insulin aspart (novoLOG) injection 0-9 Units  0-9 Units Subcutaneous TID WC Eduard ClosKakrakandy, Arshad N, MD   1 Units at 05/06/17 1213  . methocarbamol (ROBAXIN) tablet 500 mg  500 mg Oral Q8H PRN Richarda OverlieAbrol, Nayana, MD   500 mg at 05/06/17 1213  . ondansetron (ZOFRAN) tablet 4 mg  4 mg Oral Q6H PRN Eduard ClosKakrakandy, Arshad N, MD  Or  . ondansetron (ZOFRAN) injection 4 mg  4 mg Intravenous Q6H PRN Eduard Clos, MD      . oxyCODONE (Oxy IR/ROXICODONE) immediate release tablet 5-10 mg  5-10 mg Oral Q6H PRN Richarda Overlie, MD   5 mg at 05/06/17 1416  . polyethylene glycol (MIRALAX / GLYCOLAX) packet 17 g  17 g Oral Daily Richarda Overlie, MD   17 g at 05/06/17 1213     Discharge Medications: Please see discharge summary for a list of discharge medications.  Relevant Imaging Results:  Relevant Lab Results:   Additional Information SSN  [id number]  Antionette Poles, LCSW

## 2017-05-07 DIAGNOSIS — S3282XD Multiple fractures of pelvis without disruption of pelvic ring, subsequent encounter for fracture with routine healing: Secondary | ICD-10-CM

## 2017-05-07 LAB — GLUCOSE, CAPILLARY
GLUCOSE-CAPILLARY: 129 mg/dL — AB (ref 65–99)
Glucose-Capillary: 160 mg/dL — ABNORMAL HIGH (ref 65–99)
Glucose-Capillary: 115 mg/dL — ABNORMAL HIGH (ref 65–99)
Glucose-Capillary: 149 mg/dL — ABNORMAL HIGH (ref 65–99)

## 2017-05-07 MED ORDER — FENTANYL 25 MCG/HR TD PT72
25.0000 ug | MEDICATED_PATCH | TRANSDERMAL | Status: DC
  Administered 2017-05-07: 25 ug via TRANSDERMAL
  Filled 2017-05-07: qty 1

## 2017-05-07 NOTE — Care Management Note (Signed)
Case Management Note  Patient Details  Name: Sharma Covertauline F Roycroft MRN: [id number] Date of Birth: [birth year]  Subjective/Objective:  83 yo admitted with pelvic fracture.                  Action/Plan: From home. Pt to discharge home with daughter. [facility]. Pt asked this CM to contact daughter to offer choice for HHPT. Daughter states that pt is already active with [facility] and would like to continue using them. [facility] rep contacted for resumption of care and MD orders received.   Expected Discharge Date:                  Expected Discharge Plan:  Home w Home Health Services  In-House Referral:     Discharge planning Services  CM Consult  Post Acute Care Choice:  Resumption of Svcs/PTA Provider Choice offered to:  Adult Children  DME Arranged:    DME Agency:     HH Arranged:  PT, OT, Nurse's Aide [facility]  Status of Service:  Completed, signed off  If discussed at Long Length of Stay Meetings, dates discussed:    Additional CommentsBartholome Bill:  Evalena Fujii H, RN 05/07/2017, 10:57 AM  [phone]

## 2017-05-07 NOTE — Progress Notes (Signed)
PROGRESS NOTE    Stephanie Jenkins  ZOX:[id number] DOB: Jul 21, 1923 DOA: 05/04/2017 PCP: Gordy SaversKwiatkowski, Peter F, MD     Brief Narrative:  Stephanie Jenkins is a 83 y.o. female with history of diet-controlled diabetes mellitus who was recently admitted last month for pelvic fracture and discharged to [facility] for rehabilitation and was discharged 10 days ago to home; she was found to have increasing pain in the back and pelvis last few days after patient was ambulating. Patient states she ambulates with the help of a walker. She did fine at the rehabilitation and the pain started coming back again last few days. CT of the pelvis done in the ER shows right superior and inferior pubic primary fracture with new mild displacement and also left sacral fracture which was of uncertain acuity. She was admitted for intractable pain.  Assessment & Plan:   Principal Problem:   Pelvic fracture (HCC) Active Problems:   Closed pelvic fracture (HCC)   Intractable pain secondary to pelvic fracture with displacement, new? left sacral fracture, as well as subacute right sacral fracture  -Improved on current regimen, continue fentanyl patch, oral OxyIR and Robaxin as well as bowel regimen. Currently requiring prn IV fentanyl  -PT recommending HHPT and hospital bed ordered   Diet-controlled diabetes -Well controlled, SSI   Normocytic normochromic anemia -Hemoglobin at baseline, stable   DVT prophylaxis: lovenox Code Status: DNR Family Communication: daughter at bedside Disposition Plan: pending improvement, home with home health   Consultants:   None  Procedures:   None  Antimicrobials:  Anti-infectives    None       Subjective: Patient sitting in chair this morning. She states that pain is mildly better, although it is still there, especially with movement.  Objective: Vitals:   05/06/17 0517 05/06/17 1410 05/06/17 2213 05/07/17 0637  BP: (!) 152/68 (!) 107/57 (!) 150/61 138/68  Pulse:  91 81 81 89  Resp: (!) 26 18 18 18   Temp: 98.6 F (37 C) 99 F (37.2 C) 99 F (37.2 C) 98.7 F (37.1 C)  TempSrc: Oral Oral Oral Oral  SpO2: 95% 96% 95% 100%  Weight:      Height:        Intake/Output Summary (Last 24 hours) at 05/07/17 1258 Last data filed at 05/07/17 0647  Gross per 24 hour  Intake              120 ml  Output              500 ml  Net             -380 ml   Filed Weights   05/05/17 0121  Weight: 69.5 kg (153 lb 3.5 oz)    Examination:  General exam: Appears calm and comfortable  Respiratory system: Clear to auscultation. Respiratory effort normal. Cardiovascular system: S1 & S2 heard, RRR. No JVD, murmurs, rubs, gallops or clicks. No pedal edema. Gastrointestinal system: Abdomen is nondistended, soft and nontender. No organomegaly or masses felt. Normal bowel sounds heard. Central nervous system: Alert and oriented. No focal neurological deficits. Extremities: Symmetric  Skin: No rashes, lesions or ulcers Psychiatry: Judgement and insight appear normal. Mood & affect appropriate.   Data Reviewed: I have personally reviewed following labs and imaging studies  CBC:  Recent Labs Lab 05/04/17 2147 05/05/17 0523 05/06/17 0555  WBC 7.5 6.5 5.4  NEUTROABS 4.2  --   --   HGB 11.1* 10.9* 10.8*  HCT 32.3* 31.9* 32.2*  MCV  94.4 94.1 93.6  PLT 302 316 320   Basic Metabolic Panel:  Recent Labs Lab 05/04/17 2147 05/05/17 0523 05/06/17 0555  NA 133* 135 135  K 3.8 3.6 3.7  CL 102 102 102  CO2 24 25 25   GLUCOSE 135* 127* 122*  BUN 19 15 14   CREATININE 0.79 0.61 0.71  CALCIUM 8.8* 8.6* 8.4*   GFR: Estimated Creatinine Clearance: 35.1 mL/min (by C-G formula based on SCr of 0.71 mg/dL). Liver Function Tests:  Recent Labs Lab 05/06/17 0555  AST 23  ALT 19  ALKPHOS 153*  BILITOT 0.3  PROT 6.4*  ALBUMIN 3.3*   No results for input(s): LIPASE, AMYLASE in the last 168 hours. No results for input(s): AMMONIA in the last 168 hours. Coagulation  Profile: No results for input(s): INR, PROTIME in the last 168 hours. Cardiac Enzymes: No results for input(s): CKTOTAL, CKMB, CKMBINDEX, TROPONINI in the last 168 hours. BNP (last 3 results) No results for input(s): PROBNP in the last 8760 hours. HbA1C: No results for input(s): HGBA1C in the last 72 hours. CBG:  Recent Labs Lab 05/06/17 0731 05/06/17 1206 05/06/17 1632 05/06/17 2211 05/07/17 0751  GLUCAP 141* 137* 151* 133* 129*   Lipid Profile: No results for input(s): CHOL, HDL, LDLCALC, TRIG, CHOLHDL, LDLDIRECT in the last 72 hours. Thyroid Function Tests: No results for input(s): TSH, T4TOTAL, FREET4, T3FREE, THYROIDAB in the last 72 hours. Anemia Panel: No results for input(s): VITAMINB12, FOLATE, FERRITIN, TIBC, IRON, RETICCTPCT in the last 72 hours. Sepsis Labs: No results for input(s): PROCALCITON, LATICACIDVEN in the last 168 hours.  No results found for this or any previous visit (from the past 240 hour(s)).     Radiology Studies: No results found.    Scheduled Meds: . aspirin EC  81 mg Oral Daily  . docusate sodium  100 mg Oral BID  . enoxaparin (LOVENOX) injection  40 mg Subcutaneous Q24H  . fentaNYL  25 mcg Transdermal Q72H  . insulin aspart  0-9 Units Subcutaneous TID WC  . polyethylene glycol  17 g Oral Daily   Continuous Infusions:   LOS: 2 days    Time spent: 40 minutes   Noralee Stain, DO [facility] [email or web address] Password [email or web address] 05/07/2017, 12:58 PM

## 2017-05-07 NOTE — Progress Notes (Signed)
    Durable Medical Equipment        Start     Ordered   05/07/17 1046  For home use only DME Hospital bed  Once    Question Answer Comment  The above medical condition requires: Patient requires the ability to reposition frequently   Bed type Semi-electric      05/07/17 1045

## 2017-05-07 NOTE — Progress Notes (Signed)
Physical Therapy Treatment Patient Details Name: Stephanie Jenkins MRN: [id number] DOB: [birth year] Today's Date: 05/07/2017    History of Present Illness Pt is a 83 year old female with recent admission for nondisplaced R inferior and superior pubic rami fractures (d/c to SNF and had been at home for a few days prior to admission). Pt was admitted with increasing pain in the back and pelvis the last few days after ambulating. CT of pelvis reveals right superior and inferior pubic rami fractures, with mild new displacement, previously nondisplaced; left sacral fracture of uncertain acuity     PT Comments    Pt assisted with ambulating in hallway again today however distance limited due to pain.  RN aware and to give pain meds when due.  Pt with likely d/c home with daughter and 24/7 care tomorrow.  Follow Up Recommendations  Home health PT;Supervision/Assistance - 24 hour     Equipment Recommendations  Hospital bed    Recommendations for Other Services       Precautions / Restrictions Precautions Precautions: Fall Restrictions Other Position/Activity Restrictions: WBAT    Mobility  Bed Mobility               General bed mobility comments: pt up in recliner and preferred to stay up in recliner end of session  Transfers Overall transfer level: Needs assistance Equipment used: Rolling walker (2 wheeled) Transfers: Sit to/from Stand Sit to Stand: Min assist         General transfer comment: Verbal cues given for technique, pt reports a little more pain today compared to yesterday, slight assist to rise  Ambulation/Gait Ambulation/Gait assistance: Min guard Ambulation Distance (Feet): 40 Feet Assistive device: Rolling walker (2 wheeled) Gait Pattern/deviations: Step-through pattern;Antalgic;Decreased stance time - left     General Gait Details: pt reports more pain today and antalgic gait pattern present, distance limited to pt tolerance   Stairs             Wheelchair Mobility    Modified Rankin (Stroke Patients Only)       Balance                                            Cognition Arousal/Alertness: Awake/alert Behavior During Therapy: WFL for tasks assessed/performed Overall Cognitive Status: Within Functional Limits for tasks assessed                                        Exercises      General Comments        Pertinent Vitals/Pain Pain Assessment: 0-10 Pain Score: 7  Pain Location: L pelvis  Pain Descriptors / Indicators: Sore;Discomfort Pain Intervention(s): Limited activity within patient's tolerance;Repositioned;Monitored during session (RN aware, to give pain meds when due)    Home Living                      Prior Function            PT Goals (current goals can now be found in the care plan section) Progress towards PT goals: Progressing toward goals    Frequency    Min 3X/week      PT Plan Current plan remains appropriate    Co-evaluation  AM-PAC PT "6 Clicks" Daily Activity  Outcome Measure  Difficulty turning over in bed (including adjusting bedclothes, sheets and blankets)?: A Lot Difficulty moving from lying on back to sitting on the side of the bed? : Total Difficulty sitting down on and standing up from a chair with arms (e.g., wheelchair, bedside commode, etc,.)?: Total Help needed moving to and from a bed to chair (including a wheelchair)?: A Little Help needed walking in hospital room?: A Little Help needed climbing 3-5 steps with a railing? : Total 6 Click Score: 11    End of Session Equipment Utilized During Treatment: Gait belt Activity Tolerance: Patient limited by pain Patient left: in chair;with call bell/phone within reach Nurse Communication: Mobility status PT Visit Diagnosis: History of falling (Z91.81);Difficulty in walking, not elsewhere classified (R26.2)     Time: [id number] PT Time Calculation  (min) (ACUTE ONLY): 15 min  Charges:  $Gait Training: 8-22 mins                    G Codes:       Zenovia JarredKati Brandice Busser, PT, DPT 05/07/2017 Pager: [phone]   Maida SaleLEMYRE,KATHrine E 05/07/2017, 3:21 PM

## 2017-05-08 LAB — GLUCOSE, CAPILLARY: GLUCOSE-CAPILLARY: 108 mg/dL — AB (ref 65–99)

## 2017-05-08 MED ORDER — FENTANYL 25 MCG/HR TD PT72: 25.0000 ug | MEDICATED_PATCH | TRANSDERMAL | 0 refills | Status: DC

## 2017-05-08 MED ORDER — METHOCARBAMOL 500 MG PO TABS: 500.0000 mg | ORAL_TABLET | Freq: Three times a day (TID) | ORAL | 0 refills | Status: DC | PRN

## 2017-05-08 MED ORDER — OXYCODONE HCL 5 MG PO TABS: 5.0000 mg | ORAL_TABLET | Freq: Four times a day (QID) | ORAL | 0 refills | Status: DC | PRN

## 2017-05-08 NOTE — Progress Notes (Signed)
Physical Therapy Treatment Patient Details Name: Stephanie Jenkins MRN: [id number] DOB: 25-Sep-1923 Today's Date: 05/08/2017    History of Present Illness Pt is a 83 year old female with recent admission for nondisplaced R inferior and superior pubic rami fractures (d/c to SNF and had been at home for a few days prior to admission). Pt was admitted with increasing pain in the back and pelvis the last few days after ambulating. CT of pelvis reveals right superior and inferior pubic rami fractures, with mild new displacement, previously nondisplaced; left sacral fracture of uncertain acuity     PT Comments    Pt assisted with ambulating however did not tolerate much distance today.  Pt with increased difficulty mobilizing today possibly due to pain and required recliner brought behind her.  Son present for session.  Pt likely to d/c home today with 24/7 assist.   Follow Up Recommendations  Home health PT;Supervision/Assistance - 24 hour     Equipment Recommendations  Hospital bed    Recommendations for Other Services       Precautions / Restrictions Precautions Precautions: Fall Restrictions Other Position/Activity Restrictions: WBAT    Mobility  Bed Mobility               General bed mobility comments: pt up in recliner   Transfers Overall transfer level: Needs assistance Equipment used: Rolling walker (2 wheeled) Transfers: Sit to/from Stand Sit to Stand: Min assist         General transfer comment: Verbal cues given for technique, slight assist to rise and control descent  Ambulation/Gait Ambulation/Gait assistance: Min guard Ambulation Distance (Feet): 8 Feet Assistive device: Rolling walker (2 wheeled) Gait Pattern/deviations: Step-to pattern;Decreased stance time - left;Antalgic     General Gait Details: pt with increased difficulty initiating gait even with multimodal cues, increased trunk flexion and leaning on forearms after 8 feet so recliner brought to  pt, pt unable to state any poor feeling just c/o pain, vitals: 119/35 mmHg, 98 bpm, 100% SPO2   Stairs            Wheelchair Mobility    Modified Rankin (Stroke Patients Only)       Balance                                            Cognition Arousal/Alertness: Awake/alert Behavior During Therapy: WFL for tasks assessed/performed Overall Cognitive Status: Within Functional Limits for tasks assessed                                 General Comments: more groggy today however easily awakened      Exercises      General Comments        Pertinent Vitals/Pain Pain Assessment: 0-10 Pain Score: 7  Pain Location: L pelvis  Pain Descriptors / Indicators: Sore;Discomfort Pain Intervention(s): Limited activity within patient's tolerance;Repositioned;Monitored during session    Home Living                      Prior Function            PT Goals (current goals can now be found in the care plan section) Progress towards PT goals: Progressing toward goals    Frequency    Min 3X/week      PT Plan  Current plan remains appropriate    Co-evaluation              AM-PAC PT "6 Clicks" Daily Activity  Outcome Measure  Difficulty turning over in bed (including adjusting bedclothes, sheets and blankets)?: A Lot Difficulty moving from lying on back to sitting on the side of the bed? : Total Difficulty sitting down on and standing up from a chair with arms (e.g., wheelchair, bedside commode, etc,.)?: Total Help needed moving to and from a bed to chair (including a wheelchair)?: A Little Help needed walking in hospital room?: A Lot Help needed climbing 3-5 steps with a railing? : Total 6 Click Score: 10    End of Session Equipment Utilized During Treatment: Gait belt Activity Tolerance: Patient limited by pain;Patient limited by fatigue Patient left: in chair;with call bell/phone within reach;with family/visitor  present Nurse Communication: Mobility status PT Visit Diagnosis: History of falling (Z91.81);Difficulty in walking, not elsewhere classified (R26.2)     Time: [id number] PT Time Calculation (min) (ACUTE ONLY): 20 min  Charges:  $Gait Training: 8-22 mins                    G Codes:       Zenovia JarredKati Delayla Hoffmaster, PT, DPT 05/08/2017 Pager: [phone]   Maida SaleLEMYRE,KATHrine E 05/08/2017, 11:48 AM

## 2017-05-08 NOTE — Discharge Summary (Addendum)
Physician Discharge Summary  Stephanie Jenkins:[id number] DOB: Aug 02, 1923 DOA: 05/04/80 2018  PCP: Gordy Savers, MD  Admit date: 05/04/2017 Discharge date: 05/08/2017  Admitted From: Home Disposition:  [facility]  Recommendations for Outpatient Follow-up:  1. Follow up with PCP in 1 week  Home Health: PT OT RN Aide  Equipment/Devices: Hospital bed    Discharge Condition: Stable CODE STATUS: DNR  Diet recommendation: Heart healthy   Brief/Interim Summary: Stephanie Jenkins a 83 y.o.femalewith history of diet-controlled diabetes mellitus who was recently admitted last month for pelvic fracture and discharged to [facility] for rehabilitation and was discharged 10 days ago to home; she was found to have increasing pain in the back and pelvis last few days after patient was ambulating. Patient states she ambulates with the help of a walker. She did fine at the rehabilitation and the pain started coming back again last few days.CT of the pelvis done in the ER shows right superior and inferior pubic primary fracture with new mild displacement and also left sacral fracture which was of uncertain acuity. She was admitted for intractable pain.  Intractable pain secondary to pelvic fracture with displacement, new? left sacral fracture, as well as subacute right sacral fracture  -Improved on current regimen, continue fentanyl patch, oral OxyIR and Robaxin as well as bowel regimen -Discussed with Dr. Roda Shutters with [facility], recommending supportive care as patient's pain is better controlled now, no outpatient follow up needed  -PT recommending HHPT and hospital bed ordered   Diet-controlled diabetes -Well controlled  Normocytic normochromic anemia -Hemoglobin at baseline, stable   Discharge Instructions  Discharge Instructions    Call MD for:  difficulty breathing, headache or visual disturbances    Complete by:  As directed    Call MD for:  extreme fatigue    Complete by:  As  directed    Call MD for:  hives    Complete by:  As directed    Call MD for:  persistant dizziness or light-headedness    Complete by:  As directed    Call MD for:  persistant nausea and vomiting    Complete by:  As directed    Call MD for:  severe uncontrolled pain    Complete by:  As directed    Call MD for:  temperature >100.4    Complete by:  As directed    Diet - low sodium heart healthy    Complete by:  As directed    Discharge instructions    Complete by:  As directed    You were cared for by a hospitalist during your hospital stay. If you have any questions about your discharge medications or the care you received while you were in the hospital after you are discharged, you can call the unit and asked to speak with the hospitalist on call if the hospitalist that took care of you is not available. Once you are discharged, your primary care physician will handle any further medical issues. Please note that NO REFILLS for any discharge medications will be authorized once you are discharged, as it is imperative that you return to your primary care physician (or establish a relationship with a primary care physician if you do not have one) for your aftercare needs so that they can reassess your need for medications and monitor your lab values.   Increase activity slowly    Complete by:  As directed      Allergies as of 05/08/2017   No Known Allergies  Medication List    STOP taking these medications   HYDROcodone-acetaminophen 5-325 MG tablet Commonly known as:  NORCO/VICODIN     TAKE these medications   aspirin 81 MG tablet Take 81 mg by mouth daily.   fentaNYL 25 MCG/HR patch Commonly known as:  DURAGESIC - dosed mcg/hr Place 1 patch (25 mcg total) onto the skin every 3 (three) days. Start taking on:  05/10/2017   GLUCOSAMINE CHONDR 1500 COMPLX PO Take 1 tablet by mouth 2 (two) times daily.   meloxicam 15 MG tablet Commonly known as:  MOBIC TAKE 1 TABLET(15 MG) BY  MOUTH DAILY   methocarbamol 500 MG tablet Commonly known as:  ROBAXIN Take 1 tablet (500 mg total) by mouth every 8 (eight) hours as needed for muscle spasms.   multivitamin tablet Take 1 tablet by mouth daily.   oxyCODONE 5 MG immediate release tablet Commonly known as:  Oxy IR/ROXICODONE Take 1-2 tablets (5-10 mg total) by mouth every 6 (six) hours as needed for severe pain.   polyethylene glycol packet Commonly known as:  MIRALAX / GLYCOLAX Take 17 g by mouth 2 (two) times daily. What changed:  when to take this  reasons to take this   WATER PILL/POTASSIUM Tabs Generic drug:  Buchu-Junip-K Gluc-Pars-Uva Ur Take 1 tablet by mouth daily.            Durable Medical Equipment        Start     Ordered   05/07/17 1046  For home use only DME Hospital bed  Once    Question Answer Comment  The above medical condition requires: Patient requires the ability to reposition frequently   Bed type Semi-electric      05/07/17 1045     Follow-up Information    Gordy Savers, MD. Call.   Specialty:  Internal Medicine Why:  Hospital follow-up in 3-5 days Contact information: [address] [place] [address] [phone]        Home, [facility] Follow up.   Specialty:  [facility] Contact information: [address] [place] [address] [phone]        [facility] - Dme Follow up.   Why:  Hospital Bed Contact information: [address] [place] [address] [phone]          No Known Allergies  Consultations:  None   Procedures/Studies: Ct Pelvis Wo Contrast  Result Date: 05/04/2017 CLINICAL DATA:  Worsening pain, recent pelvis and sacral fractures. EXAM: CT PELVIS WITHOUT CONTRAST TECHNIQUE: Multidetector CT imaging of the pelvis was performed following the standard protocol without intravenous contrast. COMPARISON:  Right hip CT 04/05/2017 FINDINGS: Urinary Tract: Urinary bladder  minimally distended. Distal ureters are decompressed. Bowel: Decreased rectal stool burden from prior. No bowel inflammation. Vascular/Lymphatic: Vascular calcifications.  No adenopathy. Reproductive: Post hysterectomy. No adnexal mass. Peripherally calcified density in the right adnexa is stable and appears benign. Other:  There is presacral soft tissue edema. Musculoskeletal: Right superior pubic ramus fracture has some increase in displacement from prior exam. There is some peripheral callus formation but no solid bony bridging. Minimal displacement of the right inferior pubic rami fractures with some surrounding callus, more robust than the superior ramus fracture. No acetabular extension. Peripheral right sacral fracture is unchanged from prior exam. There is nondisplaced left sacral fracture, previously not included in the field of view. No left pubic rami fractures. No sacroiliac joint widening. Bones appear under mineralized. IMPRESSION: 1.  Right superior and inferior pubic rami fractures, with new mild displacement, previously nondisplaced. There is some incomplete callus formation, more exuberant about the inferior ramus fracture. 2. Unchanged alignment of right sacral fracture. 3. Left sacral fracture of uncertain acuity, previously not included in the field of view. Electronically Signed   By: Rubye Oaks M.D.   On: 05/04/2017 22:55      Discharge Exam: Vitals:   05/07/17 2126 05/08/17 0511  BP: 122/89 127/67  Pulse: (!) 103 92  Resp: 20 18  Temp: 99.1 F (37.3 C) 98.1 F (36.7 C)   Vitals:   05/07/17 1435 05/07/17 1458 05/07/17 2126 05/08/17 0511  BP: (!) 104/59 (!) 101/40 122/89 127/67  Pulse:  73 (!) 103 92  Resp:  20 20 18   Temp:  98.5 F (36.9 C) 99.1 F (37.3 C) 98.1 F (36.7 C)  TempSrc:  Oral Oral Oral  SpO2:  96% 98% 100%  Weight:      Height:        General: Pt is alert, awake, not in acute distress Cardiovascular: RRR, S1/S2 +, no rubs, no  gallops Respiratory: CTA bilaterally, no wheezing, no rhonchi Abdominal: Soft, NT, ND, bowel sounds + Extremities: no edema, no cyanosis    The results of significant diagnostics from this hospitalization (including imaging, microbiology, ancillary and laboratory) are listed below for reference.     Microbiology: No results found for this or any previous visit (from the past 240 hour(s)).   Labs: BNP (last 3 results) No results for input(s): BNP in the last 8760 hours. Basic Metabolic Panel:  Recent Labs Lab 05/04/17 2147 05/05/17 0523 05/06/17 0555  NA 133* 135 135  K 3.8 3.6 3.7  CL 102 102 102  CO2 24 25 25   GLUCOSE 135* 127* 122*  BUN 19 15 14   CREATININE 0.79 0.61 0.71  CALCIUM 8.8* 8.6* 8.4*   Liver Function Tests:  Recent Labs Lab 05/06/17 0555  AST 23  ALT 19  ALKPHOS 153*  BILITOT 0.3  PROT 6.4*  ALBUMIN 3.3*   No results for input(s): LIPASE, AMYLASE in the last 168 hours. No results for input(s): AMMONIA in the last 168 hours. CBC:  Recent Labs Lab 05/04/17 2147 05/05/17 0523 05/06/17 0555  WBC 7.5 6.5 5.4  NEUTROABS 4.2  --   --   HGB 11.1* 10.9* 10.8*  HCT 32.3* 31.9* 32.2*  MCV 94.4 94.1 93.6  PLT 302 316 320   Cardiac Enzymes: No results for input(s): CKTOTAL, CKMB, CKMBINDEX, TROPONINI in the last 168 hours. BNP: Invalid input(s): POCBNP CBG:  Recent Labs Lab 05/07/17 0751 05/07/17 1156 05/07/17 1657 05/07/17 2146 05/08/17 0719  GLUCAP 129* 160* 115* 149* 108*   D-Dimer No results for input(s): DDIMER in the last 72 hours. Hgb A1c No results for input(s): HGBA1C in the last 72 hours. Lipid Profile No results for input(s): CHOL, HDL, LDLCALC, TRIG, CHOLHDL, LDLDIRECT in the last 72 hours. Thyroid function studies No results for input(s): TSH, T4TOTAL, T3FREE, THYROIDAB in the last 72 hours.  Invalid input(s): FREET3 Anemia work up No results for input(s): VITAMINB12, FOLATE, FERRITIN, TIBC, IRON, RETICCTPCT in the  last 72 hours. Urinalysis    Component Value Date/Time   COLORURINE YELLOW 05/04/2017 2100   APPEARANCEUR CLEAR 05/04/2017 2100   LABSPEC 1.010 05/04/2017 2100   PHURINE 7.0 05/04/2017 2100   GLUCOSEU NEGATIVE 05/04/2017 2100   HGBUR NEGATIVE 05/04/2017 2100   HGBUR 2+ 07/27/2007 0000   BILIRUBINUR NEGATIVE 05/04/2017 2100  BILIRUBINUR neg 05/02/2017 1110   KETONESUR NEGATIVE 05/04/2017 2100   PROTEINUR NEGATIVE 05/04/2017 2100   UROBILINOGEN 0.2 05/02/2017 1110   UROBILINOGEN 0.2 07/27/2007 0000   NITRITE NEGATIVE 05/04/2017 2100   LEUKOCYTESUR SMALL (A) 05/04/2017 2100   Sepsis Labs Invalid input(s): PROCALCITONIN,  WBC,  LACTICIDVEN Microbiology No results found for this or any previous visit (from the past 240 hour(s)).   Time coordinating discharge: 40 minutes  SIGNED:  Noralee StainJennifer Mackson Botz, DO [facility] Pager [phone]  If 7PM-7AM, please contact night-coverage [email or web address] Password [email or web address] 05/08/2017, 11:37 AM

## 2017-05-09 DIAGNOSIS — R2689 Other abnormalities of gait and mobility: Secondary | ICD-10-CM | POA: Diagnosis not present

## 2017-05-09 DIAGNOSIS — M6281 Muscle weakness (generalized): Secondary | ICD-10-CM | POA: Diagnosis not present

## 2017-05-09 DIAGNOSIS — S329XXA Fracture of unspecified parts of lumbosacral spine and pelvis, initial encounter for closed fracture: Secondary | ICD-10-CM | POA: Diagnosis not present

## 2017-05-11 DIAGNOSIS — M80051D Age-related osteoporosis with current pathological fracture, right femur, subsequent encounter for fracture with routine healing: Secondary | ICD-10-CM | POA: Diagnosis not present

## 2017-05-11 DIAGNOSIS — Z87440 Personal history of urinary (tract) infections: Secondary | ICD-10-CM | POA: Diagnosis not present

## 2017-05-11 DIAGNOSIS — E119 Type 2 diabetes mellitus without complications: Secondary | ICD-10-CM | POA: Diagnosis not present

## 2017-05-11 DIAGNOSIS — Z9181 History of falling: Secondary | ICD-10-CM | POA: Diagnosis not present

## 2017-05-11 DIAGNOSIS — Z7982 Long term (current) use of aspirin: Secondary | ICD-10-CM | POA: Diagnosis not present

## 2017-05-12 ENCOUNTER — Ambulatory Visit: Payer: PPO | Admitting: Internal Medicine

## 2017-05-12 DIAGNOSIS — Z9181 History of falling: Secondary | ICD-10-CM | POA: Diagnosis not present

## 2017-05-12 DIAGNOSIS — Z87440 Personal history of urinary (tract) infections: Secondary | ICD-10-CM | POA: Diagnosis not present

## 2017-05-12 DIAGNOSIS — Z7982 Long term (current) use of aspirin: Secondary | ICD-10-CM | POA: Diagnosis not present

## 2017-05-12 DIAGNOSIS — E119 Type 2 diabetes mellitus without complications: Secondary | ICD-10-CM | POA: Diagnosis not present

## 2017-05-12 DIAGNOSIS — M80051D Age-related osteoporosis with current pathological fracture, right femur, subsequent encounter for fracture with routine healing: Secondary | ICD-10-CM | POA: Diagnosis not present

## 2017-05-13 ENCOUNTER — Telehealth: Payer: Self-pay | Admitting: Internal Medicine

## 2017-05-13 ENCOUNTER — Encounter: Payer: Self-pay | Admitting: Internal Medicine

## 2017-05-13 ENCOUNTER — Ambulatory Visit (INDEPENDENT_AMBULATORY_CARE_PROVIDER_SITE_OTHER): Payer: PPO | Admitting: Internal Medicine

## 2017-05-13 VITALS — BP 138/68 | HR 69 | Temp 98.0°F | Ht <= 58 in | Wt 161.2 lb

## 2017-05-13 DIAGNOSIS — M8000XD Age-related osteoporosis with current pathological fracture, unspecified site, subsequent encounter for fracture with routine healing: Secondary | ICD-10-CM

## 2017-05-13 DIAGNOSIS — R7302 Impaired glucose tolerance (oral): Secondary | ICD-10-CM | POA: Diagnosis not present

## 2017-05-13 DIAGNOSIS — E7800 Pure hypercholesterolemia, unspecified: Secondary | ICD-10-CM

## 2017-05-13 DIAGNOSIS — S32301D Unspecified fracture of right ilium, subsequent encounter for fracture with routine healing: Secondary | ICD-10-CM | POA: Diagnosis not present

## 2017-05-13 NOTE — Telephone Encounter (Signed)
° ° °  Marchelle Folksmanda with [facility] call to ask for verbal orders skilled nursing for 2 times a week for 2 weeks and 1 time a week for 5 weeks    [phone]

## 2017-05-13 NOTE — Telephone Encounter (Signed)
Verbal orders left  on Amanda's voicemail per Dr Kirtland BouchardK for skilled Nursing services.

## 2017-05-13 NOTE — Telephone Encounter (Signed)
Okay for skilled nursing orders

## 2017-05-13 NOTE — Telephone Encounter (Signed)
Ok for verbal orders? Please advise.  

## 2017-05-13 NOTE — Progress Notes (Signed)
Subjective:    Patient ID: Stephanie Jenkins, female    DOB: [birth year], 83 y.o.   MRN: [id number]  HPI  83 year old patient who is seen today in follow-up following a recent hospital discharge. She was readmitted due to intractable pain related to a recent pelvic fracture. Since her discharge.  Analgesics which have included fentanyl and oxycodone have been discontinued Pain is sporadic and at times does quite well with weightbearing but at times pain is quite uncomfortable with transfers. Analgesics have been discontinued over the past day or 2 due to pruritus and confusion. The family also describes right-sided focal motor seizures associated with some postictal confusion, and Todd paralysis on the right.  No prior history of seizures.  Apparently this was an issue during her recent hospital admission  Past Medical History:  Diagnosis Date  . Carotid bruit    bilateral  . DEGENERATIVE JOINT DISEASE 07/28/2008  . DIABETES MELLITUS, TYPE II, CONTROLLED 07/27/2007  . HYPERCHOLESTEROLEMIA, MILD 07/27/2007  . OSTEOPOROSIS 07/14/2007     Social History   Social History  . Marital status: Widowed    Spouse name: N/A  . Number of children: N/A  . Years of education: N/A   Occupational History  . Not on file.   Social History Main Topics  . Smoking status: Never Smoker  . Smokeless tobacco: Never Used  . Alcohol use No  . Drug use: No  . Sexual activity: Not on file   Other Topics Concern  . Not on file   Social History Narrative  . No narrative on file    Past Surgical History:  Procedure Laterality Date  . ABDOMINAL HYSTERECTOMY    . CATARACT EXTRACTION    . TONSILLECTOMY      Family History  Problem Relation Age of Onset  . Hypertension Other     No Known Allergies  Current Outpatient Prescriptions on File Prior to Visit  Medication Sig Dispense Refill  . aspirin 81 MG tablet Take 81 mg by mouth daily.      . Buchu-Junip-K Gluc-Pars-Uva Ur (WATER  PILL/POTASSIUM) TABS Take 1 tablet by mouth daily.    . Glucosamine-Chondroit-Vit C-Mn (GLUCOSAMINE CHONDR 1500 COMPLX PO) Take 1 tablet by mouth 2 (two) times daily.     . meloxicam (MOBIC) 15 MG tablet TAKE 1 TABLET(15 MG) BY MOUTH DAILY 90 tablet 0  . methocarbamol (ROBAXIN) 500 MG tablet Take 1 tablet (500 mg total) by mouth every 8 (eight) hours as needed for muscle spasms. 30 tablet 0  . Multiple Vitamin (MULTIVITAMIN) tablet Take 1 tablet by mouth daily.      . polyethylene glycol (MIRALAX / GLYCOLAX) packet Take 17 g by mouth 2 (two) times daily. 14 each 0  . fentaNYL (DURAGESIC - DOSED MCG/HR) 25 MCG/HR patch Place 1 patch (25 mcg total) onto the skin every 3 (three) days. (Patient not taking: Reported on 05/13/2017) 5 patch 0  . oxyCODONE (OXY IR/ROXICODONE) 5 MG immediate release tablet Take 1-2 tablets (5-10 mg total) by mouth every 6 (six) hours as needed for severe pain. (Patient not taking: Reported on 05/13/2017) 30 tablet 0   No current facility-administered medications on file prior to visit.     BP 138/68 (BP Location: Left Arm, Patient Position: Sitting, Cuff Size: Normal)   Pulse 69   Temp 98 F (36.7 C) (Oral)   Ht 4\' 8"  (1.422 m)   Wt 161 lb 3.2 oz (73.1 kg)   SpO2 98%   BMI 36.14  kg/m     Review of Systems  HENT: Negative for congestion, dental problem, hearing loss, rhinorrhea, sinus pressure, sore throat and tinnitus.   Eyes: Negative for pain, discharge and visual disturbance.  Respiratory: Negative for cough and shortness of breath.   Cardiovascular: Negative for chest pain, palpitations and leg swelling.  Gastrointestinal: Negative for abdominal distention, abdominal pain, blood in stool, constipation, diarrhea, nausea and vomiting.  Genitourinary: Negative for difficulty urinating, dysuria, flank pain, frequency, hematuria, pelvic pain, urgency, vaginal bleeding, vaginal discharge and vaginal pain.  Musculoskeletal: Positive for arthralgias. Negative for  gait problem and joint swelling.  Skin: Negative for rash.  Neurological: Positive for seizures and weakness. Negative for dizziness, syncope, speech difficulty, numbness and headaches.  Hematological: Negative for adenopathy.  Psychiatric/Behavioral: Positive for confusion. Negative for agitation, behavioral problems and dysphoric mood. The patient is not nervous/anxious.        Objective:   Physical Exam  Constitutional: She is oriented to person, place, and time. She appears well-developed and well-nourished.  Wheelchair bound Comfortable Blood pressure well controlled  HENT:  Head: Normocephalic.  Right Ear: External ear normal.  Left Ear: External ear normal.  Mouth/Throat: Oropharynx is clear and moist.  Eyes: Conjunctivae and EOM are normal. Pupils are equal, round, and reactive to light.  Neck: Normal range of motion. Neck supple. No thyromegaly present.  Cardiovascular: Normal rate, regular rhythm, normal heart sounds and intact distal pulses.   Pulmonary/Chest: Effort normal and breath sounds normal.  Abdominal: Soft. Bowel sounds are normal. She exhibits no mass. There is no tenderness.  Musculoskeletal: Normal range of motion.  Lymphadenopathy:    She has no cervical adenopathy.  Neurological: She is alert and oriented to person, place, and time.  Able to raise both legs to a horizontal position.  This does elicit pain  Skin: Skin is warm and dry. No rash noted.  Psychiatric: She has a normal mood and affect. Her behavior is normal.          Assessment & Plan:   Closed pelvic fracture  Patient has improved.  Will continue analgesics sparingly Possible seizure disorder Impaired glucose tolerance Dyslipidemia  No change in medical regimen Family/patient will report any clinical change Otherwise, follow-up in November as scheduled  Rogelia Boga

## 2017-05-13 NOTE — Patient Instructions (Addendum)
WE NOW OFFER   [facility] FAST TRACK!!!  SAME DAY Appointments for ACUTE CARE  Such as: Sprains, Injuries, cuts, abrasions, rashes, muscle pain, joint pain, back pain Colds, flu, sore throats, headache, allergies, cough, fever  Ear pain, sinus and eye infections Abdominal pain, nausea, vomiting, diarrhea, upset stomach Animal/insect bites  3 Easy Ways to Schedule: Walk-In Scheduling Call in scheduling Mychart Sign-up: [email or web address]   Call or return to clinic prn if these symptoms worsen or fail to improve as anticipated.       

## 2017-05-15 ENCOUNTER — Other Ambulatory Visit: Payer: Self-pay | Admitting: Internal Medicine

## 2017-05-16 ENCOUNTER — Other Ambulatory Visit: Payer: Self-pay | Admitting: Internal Medicine

## 2017-05-16 ENCOUNTER — Telehealth: Payer: Self-pay | Admitting: Internal Medicine

## 2017-05-16 MED ORDER — TRAMADOL HCL 50 MG PO TABS: 50.0000 mg | ORAL_TABLET | Freq: Four times a day (QID) | ORAL | 0 refills | Status: DC | PRN

## 2017-05-16 NOTE — Telephone Encounter (Signed)
Pt's daughter picked up Rx

## 2017-05-16 NOTE — Telephone Encounter (Signed)
Patient's family to drop by for a new prescription for tramadol, which has been printed and signed

## 2017-05-16 NOTE — Telephone Encounter (Signed)
Please advise 

## 2017-05-16 NOTE — Telephone Encounter (Signed)
Pts daughter is calling stating that the pt is in a lot of pain and would like to see if they could have something a little stronger than tylenol and less severe than the control substance.  Pt has not really slept and severe pain.  Pharm:  [facility] [address]  Pts daughter would like to have a call back.

## 2017-05-19 DIAGNOSIS — Z7982 Long term (current) use of aspirin: Secondary | ICD-10-CM | POA: Diagnosis not present

## 2017-05-19 DIAGNOSIS — M80051D Age-related osteoporosis with current pathological fracture, right femur, subsequent encounter for fracture with routine healing: Secondary | ICD-10-CM | POA: Diagnosis not present

## 2017-05-19 DIAGNOSIS — E119 Type 2 diabetes mellitus without complications: Secondary | ICD-10-CM | POA: Diagnosis not present

## 2017-05-19 DIAGNOSIS — Z9181 History of falling: Secondary | ICD-10-CM | POA: Diagnosis not present

## 2017-05-19 DIAGNOSIS — Z87440 Personal history of urinary (tract) infections: Secondary | ICD-10-CM | POA: Diagnosis not present

## 2017-05-20 DIAGNOSIS — E119 Type 2 diabetes mellitus without complications: Secondary | ICD-10-CM | POA: Diagnosis not present

## 2017-05-20 DIAGNOSIS — M80051D Age-related osteoporosis with current pathological fracture, right femur, subsequent encounter for fracture with routine healing: Secondary | ICD-10-CM | POA: Diagnosis not present

## 2017-05-20 DIAGNOSIS — Z7982 Long term (current) use of aspirin: Secondary | ICD-10-CM | POA: Diagnosis not present

## 2017-05-20 DIAGNOSIS — Z87440 Personal history of urinary (tract) infections: Secondary | ICD-10-CM | POA: Diagnosis not present

## 2017-05-20 DIAGNOSIS — Z9181 History of falling: Secondary | ICD-10-CM | POA: Diagnosis not present

## 2017-05-21 DIAGNOSIS — Z9181 History of falling: Secondary | ICD-10-CM | POA: Diagnosis not present

## 2017-05-21 DIAGNOSIS — Z7982 Long term (current) use of aspirin: Secondary | ICD-10-CM | POA: Diagnosis not present

## 2017-05-21 DIAGNOSIS — M80051D Age-related osteoporosis with current pathological fracture, right femur, subsequent encounter for fracture with routine healing: Secondary | ICD-10-CM | POA: Diagnosis not present

## 2017-05-21 DIAGNOSIS — E119 Type 2 diabetes mellitus without complications: Secondary | ICD-10-CM | POA: Diagnosis not present

## 2017-05-21 DIAGNOSIS — Z87440 Personal history of urinary (tract) infections: Secondary | ICD-10-CM | POA: Diagnosis not present

## 2017-05-23 ENCOUNTER — Telehealth: Payer: Self-pay | Admitting: Internal Medicine

## 2017-05-23 MED ORDER — FENTANYL 25 MCG/HR TD PT72: 25.0000 ug | MEDICATED_PATCH | TRANSDERMAL | 0 refills | Status: DC

## 2017-05-23 NOTE — Telephone Encounter (Signed)
Patient's daughter is calling in for a refill for the patient's Fentanyl Patches.

## 2017-05-23 NOTE — Telephone Encounter (Signed)
Rx printed awaiting to be signed.  

## 2017-05-26 DIAGNOSIS — E119 Type 2 diabetes mellitus without complications: Secondary | ICD-10-CM | POA: Diagnosis not present

## 2017-05-26 DIAGNOSIS — Z9181 History of falling: Secondary | ICD-10-CM | POA: Diagnosis not present

## 2017-05-26 DIAGNOSIS — Z87440 Personal history of urinary (tract) infections: Secondary | ICD-10-CM | POA: Diagnosis not present

## 2017-05-26 DIAGNOSIS — M80051D Age-related osteoporosis with current pathological fracture, right femur, subsequent encounter for fracture with routine healing: Secondary | ICD-10-CM | POA: Diagnosis not present

## 2017-05-26 DIAGNOSIS — Z7982 Long term (current) use of aspirin: Secondary | ICD-10-CM | POA: Diagnosis not present

## 2017-05-26 NOTE — Telephone Encounter (Signed)
Pt is aware rx is ready for pick up. 

## 2017-05-27 DIAGNOSIS — R2689 Other abnormalities of gait and mobility: Secondary | ICD-10-CM | POA: Diagnosis not present

## 2017-05-27 DIAGNOSIS — M6281 Muscle weakness (generalized): Secondary | ICD-10-CM | POA: Diagnosis not present

## 2017-05-27 DIAGNOSIS — S329XXA Fracture of unspecified parts of lumbosacral spine and pelvis, initial encounter for closed fracture: Secondary | ICD-10-CM | POA: Diagnosis not present

## 2017-06-02 DIAGNOSIS — Z7982 Long term (current) use of aspirin: Secondary | ICD-10-CM | POA: Diagnosis not present

## 2017-06-02 DIAGNOSIS — E119 Type 2 diabetes mellitus without complications: Secondary | ICD-10-CM | POA: Diagnosis not present

## 2017-06-02 DIAGNOSIS — Z9181 History of falling: Secondary | ICD-10-CM | POA: Diagnosis not present

## 2017-06-02 DIAGNOSIS — Z87440 Personal history of urinary (tract) infections: Secondary | ICD-10-CM | POA: Diagnosis not present

## 2017-06-02 DIAGNOSIS — M80051D Age-related osteoporosis with current pathological fracture, right femur, subsequent encounter for fracture with routine healing: Secondary | ICD-10-CM | POA: Diagnosis not present

## 2017-06-09 DIAGNOSIS — M6281 Muscle weakness (generalized): Secondary | ICD-10-CM | POA: Diagnosis not present

## 2017-06-09 DIAGNOSIS — F329 Major depressive disorder, single episode, unspecified: Secondary | ICD-10-CM | POA: Diagnosis not present

## 2017-06-09 DIAGNOSIS — E119 Type 2 diabetes mellitus without complications: Secondary | ICD-10-CM | POA: Diagnosis not present

## 2017-06-09 DIAGNOSIS — M80051D Age-related osteoporosis with current pathological fracture, right femur, subsequent encounter for fracture with routine healing: Secondary | ICD-10-CM | POA: Diagnosis not present

## 2017-06-09 DIAGNOSIS — Z7982 Long term (current) use of aspirin: Secondary | ICD-10-CM | POA: Diagnosis not present

## 2017-06-09 DIAGNOSIS — S329XXA Fracture of unspecified parts of lumbosacral spine and pelvis, initial encounter for closed fracture: Secondary | ICD-10-CM | POA: Diagnosis not present

## 2017-06-09 DIAGNOSIS — R2689 Other abnormalities of gait and mobility: Secondary | ICD-10-CM | POA: Diagnosis not present

## 2017-06-09 DIAGNOSIS — G8929 Other chronic pain: Secondary | ICD-10-CM | POA: Diagnosis not present

## 2017-06-09 DIAGNOSIS — N393 Stress incontinence (female) (male): Secondary | ICD-10-CM | POA: Diagnosis not present

## 2017-06-09 DIAGNOSIS — M545 Low back pain: Secondary | ICD-10-CM | POA: Diagnosis not present

## 2017-06-09 DIAGNOSIS — Z9181 History of falling: Secondary | ICD-10-CM | POA: Diagnosis not present

## 2017-06-09 DIAGNOSIS — Z87440 Personal history of urinary (tract) infections: Secondary | ICD-10-CM | POA: Diagnosis not present

## 2017-06-16 DIAGNOSIS — E119 Type 2 diabetes mellitus without complications: Secondary | ICD-10-CM | POA: Diagnosis not present

## 2017-06-16 DIAGNOSIS — Z9181 History of falling: Secondary | ICD-10-CM | POA: Diagnosis not present

## 2017-06-16 DIAGNOSIS — Z7982 Long term (current) use of aspirin: Secondary | ICD-10-CM | POA: Diagnosis not present

## 2017-06-16 DIAGNOSIS — Z87440 Personal history of urinary (tract) infections: Secondary | ICD-10-CM | POA: Diagnosis not present

## 2017-06-16 DIAGNOSIS — M80051D Age-related osteoporosis with current pathological fracture, right femur, subsequent encounter for fracture with routine healing: Secondary | ICD-10-CM | POA: Diagnosis not present

## 2017-06-24 DIAGNOSIS — Z7982 Long term (current) use of aspirin: Secondary | ICD-10-CM | POA: Diagnosis not present

## 2017-06-24 DIAGNOSIS — Z87440 Personal history of urinary (tract) infections: Secondary | ICD-10-CM | POA: Diagnosis not present

## 2017-06-24 DIAGNOSIS — M80051D Age-related osteoporosis with current pathological fracture, right femur, subsequent encounter for fracture with routine healing: Secondary | ICD-10-CM | POA: Diagnosis not present

## 2017-06-24 DIAGNOSIS — E119 Type 2 diabetes mellitus without complications: Secondary | ICD-10-CM | POA: Diagnosis not present

## 2017-06-24 DIAGNOSIS — Z9181 History of falling: Secondary | ICD-10-CM | POA: Diagnosis not present

## 2017-06-25 ENCOUNTER — Other Ambulatory Visit: Payer: Self-pay | Admitting: Family Medicine

## 2017-06-25 DIAGNOSIS — M5418 Radiculopathy, sacral and sacrococcygeal region: Secondary | ICD-10-CM

## 2017-06-26 ENCOUNTER — Other Ambulatory Visit: Payer: Self-pay | Admitting: Family Medicine

## 2017-06-27 DIAGNOSIS — S329XXA Fracture of unspecified parts of lumbosacral spine and pelvis, initial encounter for closed fracture: Secondary | ICD-10-CM | POA: Diagnosis not present

## 2017-06-27 DIAGNOSIS — M6281 Muscle weakness (generalized): Secondary | ICD-10-CM | POA: Diagnosis not present

## 2017-06-27 DIAGNOSIS — R2689 Other abnormalities of gait and mobility: Secondary | ICD-10-CM | POA: Diagnosis not present

## 2017-06-30 ENCOUNTER — Other Ambulatory Visit: Payer: Self-pay | Admitting: Family Medicine

## 2017-06-30 DIAGNOSIS — S322XXS Fracture of coccyx, sequela: Principal | ICD-10-CM

## 2017-06-30 DIAGNOSIS — S3210XS Unspecified fracture of sacrum, sequela: Secondary | ICD-10-CM

## 2017-07-02 DIAGNOSIS — Z111 Encounter for screening for respiratory tuberculosis: Secondary | ICD-10-CM | POA: Diagnosis not present

## 2017-07-10 DIAGNOSIS — M6281 Muscle weakness (generalized): Secondary | ICD-10-CM | POA: Diagnosis not present

## 2017-07-10 DIAGNOSIS — S329XXA Fracture of unspecified parts of lumbosacral spine and pelvis, initial encounter for closed fracture: Secondary | ICD-10-CM | POA: Diagnosis not present

## 2017-07-10 DIAGNOSIS — R2689 Other abnormalities of gait and mobility: Secondary | ICD-10-CM | POA: Diagnosis not present

## 2017-07-12 ENCOUNTER — Ambulatory Visit
Admission: RE | Admit: 2017-07-12 | Discharge: 2017-07-12 | Disposition: A | Discharge status: Home / Self Care | Payer: PPO | Source: Ambulatory Visit | Attending: Family Medicine | Admitting: Family Medicine

## 2017-07-12 DIAGNOSIS — S322XXS Fracture of coccyx, sequela: Principal | ICD-10-CM

## 2017-07-12 DIAGNOSIS — S3210XS Unspecified fracture of sacrum, sequela: Secondary | ICD-10-CM | POA: Diagnosis not present

## 2017-07-12 MED ORDER — GADOBENATE DIMEGLUMINE 529 MG/ML IV SOLN
15.0000 mL | Freq: Once | INTRAVENOUS | Status: AC | PRN
  Administered 2017-07-12: 15 mL via INTRAVENOUS

## 2017-07-24 ENCOUNTER — Encounter: Payer: Self-pay | Admitting: Internal Medicine

## 2017-07-24 DIAGNOSIS — R600 Localized edema: Secondary | ICD-10-CM | POA: Diagnosis not present

## 2017-07-24 DIAGNOSIS — F419 Anxiety disorder, unspecified: Secondary | ICD-10-CM | POA: Diagnosis not present

## 2017-07-24 DIAGNOSIS — R454 Irritability and anger: Secondary | ICD-10-CM | POA: Diagnosis not present

## 2017-07-24 DIAGNOSIS — R34 Anuria and oliguria: Secondary | ICD-10-CM | POA: Diagnosis not present

## 2017-07-24 DIAGNOSIS — E7800 Pure hypercholesterolemia, unspecified: Secondary | ICD-10-CM | POA: Diagnosis not present

## 2017-07-28 DIAGNOSIS — S329XXA Fracture of unspecified parts of lumbosacral spine and pelvis, initial encounter for closed fracture: Secondary | ICD-10-CM | POA: Diagnosis not present

## 2017-07-28 DIAGNOSIS — M6281 Muscle weakness (generalized): Secondary | ICD-10-CM | POA: Diagnosis not present

## 2017-07-28 DIAGNOSIS — R2689 Other abnormalities of gait and mobility: Secondary | ICD-10-CM | POA: Diagnosis not present

## 2017-07-30 ENCOUNTER — Telehealth: Payer: Self-pay

## 2017-07-30 NOTE — Telephone Encounter (Signed)
SENT NOTES TO SCHEDULING 

## 2017-08-04 ENCOUNTER — Encounter: Payer: Self-pay | Admitting: Cardiology

## 2017-08-04 ENCOUNTER — Ambulatory Visit (INDEPENDENT_AMBULATORY_CARE_PROVIDER_SITE_OTHER): Payer: PPO | Admitting: Cardiology

## 2017-08-04 DIAGNOSIS — R0609 Other forms of dyspnea: Secondary | ICD-10-CM

## 2017-08-04 DIAGNOSIS — R600 Localized edema: Secondary | ICD-10-CM

## 2017-08-04 MED ORDER — FUROSEMIDE 20 MG PO TABS: 20.0000 mg | ORAL_TABLET | Freq: Every day | ORAL | 3 refills | Status: DC

## 2017-08-04 NOTE — Patient Instructions (Signed)
Medication Instructions:  Your physician has recommended you make the following change in your medication:  1.  Start taking furosemide 20 mg (one tablet) by mouth daily.  Labwork: Return to our office in one week for a BMP.  Testing/Procedures: Your physician has requested that you have an echocardiogram. Echocardiography is a painless test that uses sound waves to create images of your heart. It provides your doctor with information about the size and shape of your heart and how well your heart's chambers and valves are working. This procedure takes approximately one hour. There are no restrictions for this procedure.  Follow-Up: Your physician recommends that you schedule a follow-up appointment in:  1.  Follow up in 4 weeks with an APP. 2.  Follow up with Dr. Mayford Knife in 6 months.     Any Other Special Instructions Will Be Listed Below (If Applicable).     If you need a refill on your cardiac medications before your next appointment, please call your pharmacy.

## 2017-08-04 NOTE — Progress Notes (Signed)
Cardiology Office Note    Date:  08/04/2017   ID:  Stephanie Jenkins, DOB 1923-07-23, MRN [id number]  PCP:  Gordy Savers, MD  Cardiologist:  Armanda Magic, MD   Chief Complaint  Patient presents with  . Leg Swelling    History of Present Illness:  Stephanie Jenkins is a 83 y.o. female who is being seen today for the evaluation of LE edema and elevated BNP at the request of Richmond Campbell., PA-C.   This is a 83yo female with a history of type II DM, hyperlipidemia and bilateral carotid bruits who recently saw her PCP for evaluation of LE edema.  She is here today with her daughter who states that her LE edema started after she recently fell and broke her pelvis.  She has noticed some decreased in PO intake.  She apparently sits a lot and does not like to keep her legs elevated. She has had some DOE which is new for her and if she lays ffat in bed she will get SOB and the head of the bed has to be raised up.  She denies any chest pain or pressure,  dizziness, palpitations or syncope.  Her BNP was checked by her PCP and was mildly elevated at 231.  She is now her for further evaluation.     Past Medical History:  Diagnosis Date  . Carotid bruit    bilateral  . Closed pelvic fracture (HCC) 05/05/2017  . DEGENERATIVE JOINT DISEASE 07/28/2008  . DIABETES MELLITUS, TYPE II, CONTROLLED 07/27/2007  . HYPERCHOLESTEROLEMIA, MILD 07/27/2007  . Impaired glucose tolerance 08/07/2012  . OSTEOPOROSIS 07/14/2007  . Pelvic fracture (HCC) 04/04/2017    Past Surgical History:  Procedure Laterality Date  . ABDOMINAL HYSTERECTOMY    . CATARACT EXTRACTION    . TONSILLECTOMY      Current Medications: Current Meds  Medication Sig  . aspirin 81 MG tablet Take 81 mg by mouth daily.    . fentaNYL (DURAGESIC - DOSED MCG/HR) 25 MCG/HR patch Place 1 patch (25 mcg total) onto the skin every 3 (three) days.  . Glucosamine-Chondroit-Vit C-Mn (GLUCOSAMINE CHONDR 1500 COMPLX PO) Take 1 tablet by mouth 2 (two)  times daily.   . meloxicam (MOBIC) 15 MG tablet TAKE 1 TABLET(15 MG) BY MOUTH DAILY  . methocarbamol (ROBAXIN) 500 MG tablet Take 1 tablet (500 mg total) by mouth every 8 (eight) hours as needed for muscle spasms.  . Multiple Vitamin (MULTIVITAMIN) tablet Take 1 tablet by mouth daily.    . polyethylene glycol (MIRALAX / GLYCOLAX) packet Take 17 g by mouth 2 (two) times daily.  . sertraline (ZOLOFT) 50 MG tablet Take 50 mg by mouth daily.  . traMADol (ULTRAM) 50 MG tablet Take 1 tablet (50 mg total) by mouth every 6 (six) hours as needed.    Allergies:   Hydrocodone-acetaminophen and Oxycodone   Social History   Social History  . Marital status: Widowed    Spouse name: N/A  . Number of children: N/A  . Years of education: N/A   Social History Main Topics  . Smoking status: Never Smoker  . Smokeless tobacco: Never Used  . Alcohol use No  . Drug use: No  . Sexual activity: Not Asked   Other Topics Concern  . None   Social History Narrative  . None     Family History:  The patient's family history includes Hypertension in her other.   ROS:   Please see the history of  present illness.    ROS All other systems reviewed and are negative.  No flowsheet data found.     PHYSICAL EXAM:   VS:  BP (!) 134/56   Pulse 83   Ht  (1.422 m)   Wt 158 lb (71.7 kg)   SpO2 97%   BMI 35.42 kg/m    GEN: Well nourished, well developed, in no acute distress  HEENT: normal  Neck: no JVD, carotid bruits, or masses Cardiac: RRR; no murmurs, rubs, or gallops,no edema.  Intact distal pulses bilaterally.  Respiratory:  clear to auscultation bilaterally, normal work of breathing GI: soft, nontender, nondistended, + BS MS: no deformity or atrophy  Skin: warm and dry, no rash Neuro:  Alert and Oriented x 3, Strength and sensation are intact Psych: euthymic mood, full affect  Wt Readings from Last 3 Encounters:  08/04/17 158 lb (71.7 kg)  05/13/17 161 lb 3.2 oz (73.1 kg)  05/05/17  153 lb 3.5 oz (69.5 kg)      Studies/Labs Reviewed:   EKG:  EKG is ordered today.  The ekg ordered today demonstrates NSR with no ST changes  Recent Labs: 09/03/2016: TSH 0.57 05/06/2017: ALT 19; BUN 14; Creatinine, Ser 0.71; Hemoglobin 10.8; Platelets 320; Potassium 3.7; Sodium 135   Lipid Panel    Component Value Date/Time   CHOL 189 08/14/2015 0941   TRIG 79.0 08/14/2015 0941   HDL 64.80 08/14/2015 0941   CHOLHDL 3 08/14/2015 0941   VLDL 15.8 08/14/2015 0941   LDLCALC 109 (H) 08/14/2015 0941    Additional studies/ records that were reviewed today include:  Office notes from PCP    ASSESSMENT:    1. Edema extremities   2. DOE (dyspnea on exertion)      PLAN:  In order of problems listed above:  1. LE edema likely related to sedentary state, dietary indiscretion with sodium and keeping legs dependent.  Her BNP was mildly elevated c/w volume overload.  I suspect that given her advanced age, she likely has some diastolic CHF.  I have recommended starting Lasix  daily and checking a BMET in 1 week.  I will check a 2D echo to assess LVF.   I will have her followup with the PA in 4 weeks to see how she is doing.    2.  DOE - she also has orthopnea and I suspect that she has diastolic CHF.  I will start her on Lasix  daily and check a 2D echo to assess LVF and diastolic function.  Her baseline renal function was normal when recently checked as well as TSH.  We discussed following a 1.5 - 2gm sodium diet.      Medication Adjustments/Labs and Tests Ordered: Current medicines are reviewed at length with the patient today.  Concerns regarding medicines are outlined above.  Medication changes, Labs and Tests ordered today are listed in the Patient Instructions below.  There are no Patient Instructions on file for this visit.   Signed, Armanda Magic, MD  08/04/2017 2:26 PM    [facility] [address] [place], [place], [place]  [address] Phone: [phone]; Fax: [phone]

## 2017-08-05 DIAGNOSIS — M533 Sacrococcygeal disorders, not elsewhere classified: Secondary | ICD-10-CM | POA: Diagnosis not present

## 2017-08-05 DIAGNOSIS — G8929 Other chronic pain: Secondary | ICD-10-CM | POA: Diagnosis not present

## 2017-08-05 DIAGNOSIS — M545 Low back pain: Secondary | ICD-10-CM | POA: Diagnosis not present

## 2017-08-05 DIAGNOSIS — F419 Anxiety disorder, unspecified: Secondary | ICD-10-CM | POA: Diagnosis not present

## 2017-08-09 DIAGNOSIS — R2689 Other abnormalities of gait and mobility: Secondary | ICD-10-CM | POA: Diagnosis not present

## 2017-08-09 DIAGNOSIS — M6281 Muscle weakness (generalized): Secondary | ICD-10-CM | POA: Diagnosis not present

## 2017-08-09 DIAGNOSIS — S329XXA Fracture of unspecified parts of lumbosacral spine and pelvis, initial encounter for closed fracture: Secondary | ICD-10-CM | POA: Diagnosis not present

## 2017-08-11 ENCOUNTER — Other Ambulatory Visit: Payer: PPO | Admitting: *Deleted

## 2017-08-11 ENCOUNTER — Ambulatory Visit (HOSPITAL_COMMUNITY): Payer: PPO | Attending: Internal Medicine

## 2017-08-11 ENCOUNTER — Other Ambulatory Visit: Payer: Self-pay

## 2017-08-11 DIAGNOSIS — R600 Localized edema: Secondary | ICD-10-CM | POA: Insufficient documentation

## 2017-08-11 DIAGNOSIS — I081 Rheumatic disorders of both mitral and tricuspid valves: Secondary | ICD-10-CM | POA: Insufficient documentation

## 2017-08-11 DIAGNOSIS — R0609 Other forms of dyspnea: Secondary | ICD-10-CM

## 2017-08-12 LAB — BASIC METABOLIC PANEL
BUN / CREAT RATIO: 19 (ref 12–28)
BUN: 18 mg/dL (ref 10–36)
CALCIUM: 9.3 mg/dL (ref 8.7–10.3)
CO2: 24 mmol/L (ref 20–29)
Chloride: 96 mmol/L (ref 96–106)
Creatinine, Ser: 0.97 mg/dL (ref 0.57–1.00)
GFR, EST AFRICAN AMERICAN: 59 mL/min/{1.73_m2} — AB (ref >59)
GFR, EST NON AFRICAN AMERICAN: 51 mL/min/{1.73_m2} — AB (ref >59)
Glucose: 119 mg/dL — ABNORMAL HIGH (ref 65–99)
Potassium: 4.7 mmol/L (ref 3.5–5.2)
Sodium: 136 mmol/L (ref 134–144)

## 2017-08-27 DIAGNOSIS — S329XXA Fracture of unspecified parts of lumbosacral spine and pelvis, initial encounter for closed fracture: Secondary | ICD-10-CM | POA: Diagnosis not present

## 2017-08-27 DIAGNOSIS — M6281 Muscle weakness (generalized): Secondary | ICD-10-CM | POA: Diagnosis not present

## 2017-08-27 DIAGNOSIS — R2689 Other abnormalities of gait and mobility: Secondary | ICD-10-CM | POA: Diagnosis not present

## 2017-09-01 ENCOUNTER — Ambulatory Visit: Payer: PPO | Admitting: Physician Assistant

## 2017-09-09 ENCOUNTER — Encounter: Payer: Medicare HMO | Admitting: Internal Medicine

## 2017-09-09 DIAGNOSIS — M6281 Muscle weakness (generalized): Secondary | ICD-10-CM | POA: Diagnosis not present

## 2017-09-09 DIAGNOSIS — R2689 Other abnormalities of gait and mobility: Secondary | ICD-10-CM | POA: Diagnosis not present

## 2017-09-09 DIAGNOSIS — S329XXA Fracture of unspecified parts of lumbosacral spine and pelvis, initial encounter for closed fracture: Secondary | ICD-10-CM | POA: Diagnosis not present

## 2017-09-16 ENCOUNTER — Ambulatory Visit: Payer: PPO | Admitting: Physician Assistant

## 2017-09-16 DIAGNOSIS — Z0000 Encounter for general adult medical examination without abnormal findings: Secondary | ICD-10-CM | POA: Diagnosis not present

## 2017-09-22 ENCOUNTER — Ambulatory Visit: Payer: PPO | Admitting: Physician Assistant

## 2017-09-22 ENCOUNTER — Encounter: Payer: Self-pay | Admitting: Physician Assistant

## 2017-09-22 VITALS — BP 118/78 | HR 75 | Ht <= 58 in | Wt 157.8 lb

## 2017-09-22 DIAGNOSIS — E7800 Pure hypercholesterolemia, unspecified: Secondary | ICD-10-CM

## 2017-09-22 DIAGNOSIS — R600 Localized edema: Secondary | ICD-10-CM | POA: Diagnosis not present

## 2017-09-22 DIAGNOSIS — I5032 Chronic diastolic (congestive) heart failure: Secondary | ICD-10-CM

## 2017-09-22 NOTE — Progress Notes (Signed)
Cardiology Office Note    Date:  09/22/2017   ID:  Stephanie Jenkins, DOB 10/17/1923, MRN [id number]  PCP:  Richmond Campbell., PA-C  Cardiologist: Dr. Armanda Magic  Chief Complaint  Patient presents with  . Follow-up    History of Present Illness:  Stephanie Jenkins is a 83 y.o. female who is Dr. Mayford Knife saw for the first time 08/04/17 for evaluation of lower extremity edema and elevated BNP 231.  It started after she fell and broke her pelvis.  It was felt secondary to sedentary state, dietary indiscretion.  She was given low-dose Lasix 20 mg daily and ordered a 2D echo that showed normal LV function ejection fraction 60-65% with grade 1 DD thickened mitral valve with severe calcification, mild MR  Patient comes in today accompanied by her daughter.  She did not notice any change in her edema her breathing with the addition of Lasix.  She is eating some canned foods.  She almost seems to be doing shallow breathing and is anxious.  When I asked her about this her breathing smoothed out.    Past Medical History:  Diagnosis Date  . Carotid bruit    bilateral  . Closed pelvic fracture (HCC) 05/05/2017  . DEGENERATIVE JOINT DISEASE 07/28/2008  . DIABETES MELLITUS, TYPE II, CONTROLLED 07/27/2007  . HYPERCHOLESTEROLEMIA, MILD 07/27/2007  . Impaired glucose tolerance 08/07/2012  . OSTEOPOROSIS 07/14/2007  . Pelvic fracture (HCC) 04/04/2017    Past Surgical History:  Procedure Laterality Date  . ABDOMINAL HYSTERECTOMY    . CATARACT EXTRACTION    . TONSILLECTOMY      Current Medications: Current Meds  Medication Sig  . aspirin 81 MG tablet Take 81 mg by mouth daily.    . fentaNYL (DURAGESIC - DOSED MCG/HR) 25 MCG/HR patch Place 1 patch (25 mcg total) onto the skin every 3 (three) days.  . furosemide (LASIX) 20 MG tablet Take 1 tablet (20 mg total) by mouth daily.  . Glucosamine-Chondroit-Vit C-Mn (GLUCOSAMINE CHONDR 1500 COMPLX PO) Take 1 tablet by mouth 2 (two) times daily.   . meloxicam  (MOBIC) 15 MG tablet TAKE 1 TABLET(15 MG) BY MOUTH DAILY  . methocarbamol (ROBAXIN) 500 MG tablet Take 1 tablet (500 mg total) by mouth every 8 (eight) hours as needed for muscle spasms.  . Multiple Vitamin (MULTIVITAMIN) tablet Take 1 tablet by mouth daily.    . polyethylene glycol (MIRALAX / GLYCOLAX) packet Take 17 g by mouth 2 (two) times daily.  . sertraline (ZOLOFT) 50 MG tablet Take 50 mg by mouth daily.  . traMADol (ULTRAM) 50 MG tablet Take 1 tablet (50 mg total) by mouth every 6 (six) hours as needed.     Allergies:   Hydrocodone-acetaminophen and Oxycodone   Social History   Socioeconomic History  . Marital status: Widowed    Spouse name: None  . Number of children: None  . Years of education: None  . Highest education level: None  Social Needs  . Financial resource strain: None  . Food insecurity - worry: None  . Food insecurity - inability: None  . Transportation needs - medical: None  . Transportation needs - non-medical: None  Occupational History  . None  Tobacco Use  . Smoking status: Never Smoker  . Smokeless tobacco: Never Used  Substance and Sexual Activity  . Alcohol use: No  . Drug use: No  . Sexual activity: None  Other Topics Concern  . None  Social History Narrative  . None  Family History:  The patient's family history includes Hypertension in her other.   ROS:   Please see the history of present illness.    Review of Systems  Constitution: Negative.  HENT: Positive for hearing loss.   Eyes: Negative.   Cardiovascular: Positive for dyspnea on exertion and leg swelling.  Respiratory: Negative.   Hematologic/Lymphatic: Negative.   Musculoskeletal: Negative.  Negative for joint pain.  Gastrointestinal: Negative.   Genitourinary: Negative.   Neurological: Negative.    All other systems reviewed and are negative.   PHYSICAL EXAM:   VS:  BP 118/78   Pulse 75   Ht 4\' 8"  (1.422 m)   Wt 157 lb 12.8 oz (71.6 kg)   SpO2 98%   BMI 35.38  kg/m   Physical Exam  GEN: Well nourished, well developed, in no acute distress  Neck: no JVD, carotid bruits, or masses Cardiac:RRR; no murmurs, rubs, or gallops  Respiratory:  clear to auscultation bilaterally, normal work of breathing GI: soft, nontender, nondistended, + BS Ext: Dependent edema in the ankles, Good distal pulses bilaterally Neuro:  Alert and Oriented x 3 Psych: euthymic mood, full affect  Wt Readings from Last 3 Encounters:  09/22/17 157 lb 12.8 oz (71.6 kg)  08/04/17 158 lb (71.7 kg)  05/13/17 161 lb 3.2 oz (73.1 kg)      Studies/Labs Reviewed:   EKG:  EKG is not ordered today.    Recent Labs: 05/06/2017: ALT 19; Hemoglobin 10.8; Platelets 320 08/11/2017: BUN 18; Creatinine, Ser 0.97; Potassium 4.7; Sodium 136   Lipid Panel    Component Value Date/Time   CHOL 189 08/14/2015 0941   TRIG 79.0 08/14/2015 0941   HDL 64.80 08/14/2015 0941   CHOLHDL 3 08/14/2015 0941   VLDL 15.8 08/14/2015 0941   LDLCALC 109 (H) 08/14/2015 0941    Additional studies/ records that were reviewed today include:  2D echo 08/11/17 Study Conclusions   - Left ventricle: The cavity size was normal. Wall thickness was   normal. Systolic function was normal. The estimated ejection   fraction was in the range of 60% to 65%. Doppler parameters are   consistent with abnormal left ventricular relaxation (grade 1   diastolic dysfunction). Doppler parameters are consistent with   high ventricular filling pressure. - Mitral valve: MV is thickened with mildly restricted motion There   is severe calcification of the subvalular apparatus of the   posterior leaflet.   Peak and mean gradients through the valve are 14 and 4 mm Hg   respectively MVA by P T1/2 is 1.82 cm2 Calcified annulus. Mildly   thickened leaflets . There was mild regurgitation. - Pulmonary arteries: PA peak pressure: 37 mm Hg (S).    ASSESSMENT:    1. Edema extremities   2. Chronic diastolic CHF (congestive heart  failure) (HCC)   3. HYPERCHOLESTEROLEMIA, MILD      PLAN:  In order of problems listed above: Chronic diastolic CHF compensated.  2D echo showed normal LVEF with grade 1 DD.  Discussed the importance of 2 g sodium diet.  Compression hose prescription given.  Continue current dose Lasix.  Follow-up with Dr. Mayford Knifeurner in 2-3 months.  Lower extremity edema most of it is dependent edema.  Compression stockings ordered  Mild hypercholesterolemia given her advanced age recommend diet.    Medication Adjustments/Labs and Tests Ordered: Current medicines are reviewed at length with the patient today.  Concerns regarding medicines are outlined above.  Medication changes, Labs and Tests ordered today  are listed in the Patient Instructions below. Patient Instructions  Medication Instructions:  Your physician recommends that you continue on your current medications as directed. Please refer to the Current Medication list given to you today.   Labwork: None ordered  Testing/Procedures: None ordered  Follow-Up: Your physician recommends that you schedule a follow-up appointment in: 3-4 MONTHS WITH DR. Mayford Knife   Any Other Special Instructions Will Be Listed Below (If Applicable).    DASH Eating Plan DASH stands for "Dietary Approaches to Stop Hypertension." The DASH eating plan is a healthy eating plan that has been shown to reduce high blood pressure (hypertension). It may also reduce your risk for type 2 diabetes, heart disease, and stroke. The DASH eating plan may also help with weight loss. What are tips for following this plan? General guidelines  Avoid eating more than 2,300 mg (milligrams) of salt (sodium) a day. If you have hypertension, you may need to reduce your sodium intake to 1,500 mg a day.  Limit alcohol intake to no more than 1 drink a day for nonpregnant women and 2 drinks a day for men. One drink equals 12 oz of beer, 5 oz of wine, or 1 oz of hard liquor.  Work with your  health care provider to maintain a healthy body weight or to lose weight. Ask what an ideal weight is for you.  Get at least 30 minutes of exercise that causes your heart to beat faster (aerobic exercise) most days of the week. Activities may include walking, swimming, or biking.  Work with your health care provider or diet and nutrition specialist (dietitian) to adjust your eating plan to your individual calorie needs. Reading food labels  Check food labels for the amount of sodium per serving. Choose foods with less than 5 percent of the Daily Value of sodium. Generally, foods with less than 300 mg of sodium per serving fit into this eating plan.  To find whole grains, look for the word "whole" as the first word in the ingredient list. Shopping  Buy products labeled as "low-sodium" or "no salt added."  Buy fresh foods. Avoid canned foods and premade or frozen meals. Cooking  Avoid adding salt when cooking. Use salt-free seasonings or herbs instead of table salt or sea salt. Check with your health care provider or pharmacist before using salt substitutes.  Do not fry foods. Cook foods using healthy methods such as baking, boiling, grilling, and broiling instead.  Cook with heart-healthy oils, such as olive, canola, soybean, or sunflower oil. Meal planning   Eat a balanced diet that includes: ? 5 or more servings of fruits and vegetables each day. At each meal, try to fill half of your plate with fruits and vegetables. ? Up to 6-8 servings of whole grains each day. ? Less than 6 oz of lean meat, poultry, or fish each day. A 3-oz serving of meat is about the same size as a deck of cards. One egg equals 1 oz. ? 2 servings of low-fat dairy each day. ? A serving of nuts, seeds, or beans 5 times each week. ? Heart-healthy fats. Healthy fats called Omega-3 fatty acids are found in foods such as flaxseeds and coldwater fish, like sardines, salmon, and mackerel.  Limit how much you eat of  the following: ? Canned or prepackaged foods. ? Food that is high in trans fat, such as fried foods. ? Food that is high in saturated fat, such as fatty meat. ? Sweets, desserts, sugary drinks, and  other foods with added sugar. ? Full-fat dairy products.  Do not salt foods before eating.  Try to eat at least 2 vegetarian meals each week.  Eat more home-cooked food and less restaurant, buffet, and fast food.  When eating at a restaurant, ask that your food be prepared with less salt or no salt, if possible. What foods are recommended? The items listed may not be a complete list. Talk with your dietitian about what dietary choices are best for you. Grains Whole-grain or whole-wheat bread. Whole-grain or whole-wheat pasta. Brown rice. Orpah Cobb. Bulgur. Whole-grain and low-sodium cereals. Pita bread. Low-fat, low-sodium crackers. Whole-wheat flour tortillas. Vegetables Fresh or frozen vegetables (raw, steamed, roasted, or grilled). Low-sodium or reduced-sodium tomato and vegetable juice. Low-sodium or reduced-sodium tomato sauce and tomato paste. Low-sodium or reduced-sodium canned vegetables. Fruits All fresh, dried, or frozen fruit. Canned fruit in natural juice (without added sugar). Meat and other protein foods Skinless chicken or [place]. Ground chicken or [place]. Pork with fat trimmed off. Fish and seafood. Egg whites. Dried beans, peas, or lentils. Unsalted nuts, nut butters, and seeds. Unsalted canned beans. Lean cuts of beef with fat trimmed off. Low-sodium, lean deli meat. Dairy Low-fat (1%) or fat-free (skim) milk. Fat-free, low-fat, or reduced-fat cheeses. Nonfat, low-sodium ricotta or cottage cheese. Low-fat or nonfat yogurt. Low-fat, low-sodium cheese. Fats and oils Soft margarine without trans fats. Vegetable oil. Low-fat, reduced-fat, or light mayonnaise and salad dressings (reduced-sodium). Canola, safflower, olive, soybean, and sunflower oils. Avocado. Seasoning and  other foods Herbs. Spices. Seasoning mixes without salt. Unsalted popcorn and pretzels. Fat-free sweets. What foods are not recommended? The items listed may not be a complete list. Talk with your dietitian about what dietary choices are best for you. Grains Baked goods made with fat, such as croissants, muffins, or some breads. Dry pasta or rice meal packs. Vegetables Creamed or fried vegetables. Vegetables in a cheese sauce. Regular canned vegetables (not low-sodium or reduced-sodium). Regular canned tomato sauce and paste (not low-sodium or reduced-sodium). Regular tomato and vegetable juice (not low-sodium or reduced-sodium). Rosita Fire. Olives. Fruits Canned fruit in a light or heavy syrup. Fried fruit. Fruit in cream or butter sauce. Meat and other protein foods Fatty cuts of meat. Ribs. Fried meat. Tomasa Blase. Sausage. Bologna and other processed lunch meats. Salami. Fatback. Hotdogs. Bratwurst. Salted nuts and seeds. Canned beans with added salt. Canned or smoked fish. Whole eggs or egg yolks. Chicken or [place] with skin. Dairy Whole or 2% milk, cream, and half-and-half. Whole or full-fat cream cheese. Whole-fat or sweetened yogurt. Full-fat cheese. Nondairy creamers. Whipped toppings. Processed cheese and cheese spreads. Fats and oils Butter. Stick margarine. Lard. Shortening. Ghee. Bacon fat. Tropical oils, such as coconut, palm kernel, or palm oil. Seasoning and other foods Salted popcorn and pretzels. Onion salt, garlic salt, seasoned salt, table salt, and sea salt. Worcestershire sauce. Tartar sauce. Barbecue sauce. Teriyaki sauce. Soy sauce, including reduced-sodium. Steak sauce. Canned and packaged gravies. Fish sauce. Oyster sauce. Cocktail sauce. Horseradish that you find on the shelf. Ketchup. Mustard. Meat flavorings and tenderizers. Bouillon cubes. Hot sauce and Tabasco sauce. Premade or packaged marinades. Premade or packaged taco seasonings. Relishes. Regular salad dressings. Where to  find more information:  National Heart, Lung, and Blood Institute: PopSteam.is  American Heart Association: www.heart.org Summary  The DASH eating plan is a healthy eating plan that has been shown to reduce high blood pressure (hypertension). It may also reduce your risk for type 2 diabetes, heart disease, and stroke.  With the DASH eating plan, you should limit salt (sodium) intake to 2,300 mg a day. If you have hypertension, you may need to reduce your sodium intake to 1,500 mg a day.  When on the DASH eating plan, aim to eat more fresh fruits and vegetables, whole grains, lean proteins, low-fat dairy, and heart-healthy fats.  Work with your health care provider or diet and nutrition specialist (dietitian) to adjust your eating plan to your individual calorie needs. This information is not intended to replace advice given to you by your health care provider. Make sure you discuss any questions you have with your health care provider. Document Released: 10/10/2011 Document Revised: 10/14/2016 Document Reviewed: 10/14/2016 Elsevier Interactive Patient Education  [address]2017 [employer].     Signed, Jacolyn ReedyMichele Azusena Erlandson, [place] JerseyPA-C  09/22/2017 3:00 PM    [facility] [address], [place], [place]  [id number] Phone: [phone]; Fax: [phone]

## 2017-09-22 NOTE — Patient Instructions (Addendum)
Medication Instructions:  Your physician recommends that you continue on your current medications as directed. Please refer to the Current Medication list given to you today.   Labwork: None ordered  Testing/Procedures: None ordered  Follow-Up: Your physician recommends that you schedule a follow-up appointment in: 3-4 MONTHS WITH DR. Mayford KnifeURNER   Any Other Special Instructions Will Be Listed Below (If Applicable).    DASH Eating Plan DASH stands for "Dietary Approaches to Stop Hypertension." The DASH eating plan is a healthy eating plan that has been shown to reduce high blood pressure (hypertension). It may also reduce your risk for type 2 diabetes, heart disease, and stroke. The DASH eating plan may also help with weight loss. What are tips for following this plan? General guidelines  Avoid eating more than 2,300 mg (milligrams) of salt (sodium) a day. If you have hypertension, you may need to reduce your sodium intake to 1,500 mg a day.  Limit alcohol intake to no more than 1 drink a day for nonpregnant women and 2 drinks a day for men. One drink equals 12 oz of beer, 5 oz of wine, or 1 oz of hard liquor.  Work with your health care provider to maintain a healthy body weight or to lose weight. Ask what an ideal weight is for you.  Get at least 30 minutes of exercise that causes your heart to beat faster (aerobic exercise) most days of the week. Activities may include walking, swimming, or biking.  Work with your health care provider or diet and nutrition specialist (dietitian) to adjust your eating plan to your individual calorie needs. Reading food labels  Check food labels for the amount of sodium per serving. Choose foods with less than 5 percent of the Daily Value of sodium. Generally, foods with less than 300 mg of sodium per serving fit into this eating plan.  To find whole grains, look for the word "whole" as the first word in the ingredient list. Shopping  Buy products  labeled as "low-sodium" or "no salt added."  Buy fresh foods. Avoid canned foods and premade or frozen meals. Cooking  Avoid adding salt when cooking. Use salt-free seasonings or herbs instead of table salt or sea salt. Check with your health care provider or pharmacist before using salt substitutes.  Do not fry foods. Cook foods using healthy methods such as baking, boiling, grilling, and broiling instead.  Cook with heart-healthy oils, such as olive, canola, soybean, or sunflower oil. Meal planning   Eat a balanced diet that includes: ? 5 or more servings of fruits and vegetables each day. At each meal, try to fill half of your plate with fruits and vegetables. ? Up to 6-8 servings of whole grains each day. ? Less than 6 oz of lean meat, poultry, or fish each day. A 3-oz serving of meat is about the same size as a deck of cards. One egg equals 1 oz. ? 2 servings of low-fat dairy each day. ? A serving of nuts, seeds, or beans 5 times each week. ? Heart-healthy fats. Healthy fats called Omega-3 fatty acids are found in foods such as flaxseeds and coldwater fish, like sardines, salmon, and mackerel.  Limit how much you eat of the following: ? Canned or prepackaged foods. ? Food that is high in trans fat, such as fried foods. ? Food that is high in saturated fat, such as fatty meat. ? Sweets, desserts, sugary drinks, and other foods with added sugar. ? Full-fat dairy products.  Do  not salt foods before eating.  Try to eat at least 2 vegetarian meals each week.  Eat more home-cooked food and less restaurant, buffet, and fast food.  When eating at a restaurant, ask that your food be prepared with less salt or no salt, if possible. What foods are recommended? The items listed may not be a complete list. Talk with your dietitian about what dietary choices are best for you. Grains Whole-grain or whole-wheat bread. Whole-grain or whole-wheat pasta. Brown rice. [place]. Bulgur.  Whole-grain and low-sodium cereals. Pita bread. Low-fat, low-sodium crackers. Whole-wheat flour tortillas. Vegetables Fresh or frozen vegetables (raw, steamed, roasted, or grilled). Low-sodium or reduced-sodium tomato and vegetable juice. Low-sodium or reduced-sodium tomato sauce and tomato paste. Low-sodium or reduced-sodium canned vegetables. Fruits All fresh, dried, or frozen fruit. Canned fruit in natural juice (without added sugar). Meat and other protein foods Skinless chicken or [place]. Ground chicken or [place]. Pork with fat trimmed off. Fish and seafood. Egg whites. Dried beans, peas, or lentils. Unsalted nuts, nut butters, and seeds. Unsalted canned beans. Lean cuts of beef with fat trimmed off. Low-sodium, lean deli meat. Dairy Low-fat (1%) or fat-free (skim) milk. Fat-free, low-fat, or reduced-fat cheeses. Nonfat, low-sodium ricotta or cottage cheese. Low-fat or nonfat yogurt. Low-fat, low-sodium cheese. Fats and oils Soft margarine without trans fats. Vegetable oil. Low-fat, reduced-fat, or light mayonnaise and salad dressings (reduced-sodium). Canola, safflower, olive, soybean, and sunflower oils. Avocado. Seasoning and other foods Herbs. Spices. Seasoning mixes without salt. Unsalted popcorn and pretzels. Fat-free sweets. What foods are not recommended? The items listed may not be a complete list. Talk with your dietitian about what dietary choices are best for you. Grains Baked goods made with fat, such as croissants, muffins, or some breads. Dry pasta or rice meal packs. Vegetables Creamed or fried vegetables. Vegetables in a cheese sauce. Regular canned vegetables (not low-sodium or reduced-sodium). Regular canned tomato sauce and paste (not low-sodium or reduced-sodium). Regular tomato and vegetable juice (not low-sodium or reduced-sodium). Angie Fava. Olives. Fruits Canned fruit in a light or heavy syrup. Fried fruit. Fruit in cream or butter sauce. Meat and other protein  foods Fatty cuts of meat. Ribs. Fried meat. Berniece Salines. Sausage. Bologna and other processed lunch meats. Salami. Fatback. Hotdogs. Bratwurst. Salted nuts and seeds. Canned beans with added salt. Canned or smoked fish. Whole eggs or egg yolks. Chicken or [place] with skin. Dairy Whole or 2% milk, cream, and half-and-half. Whole or full-fat cream cheese. Whole-fat or sweetened yogurt. Full-fat cheese. Nondairy creamers. Whipped toppings. Processed cheese and cheese spreads. Fats and oils Butter. Stick margarine. Lard. Shortening. Ghee. Bacon fat. Tropical oils, such as coconut, palm kernel, or palm oil. Seasoning and other foods Salted popcorn and pretzels. Onion salt, garlic salt, seasoned salt, table salt, and sea salt. Worcestershire sauce. Tartar sauce. Barbecue sauce. Teriyaki sauce. Soy sauce, including reduced-sodium. Steak sauce. Canned and packaged gravies. Fish sauce. Oyster sauce. Cocktail sauce. Horseradish that you find on the shelf. Ketchup. Mustard. Meat flavorings and tenderizers. Bouillon cubes. Hot sauce and Tabasco sauce. Premade or packaged marinades. Premade or packaged taco seasonings. Relishes. Regular salad dressings. Where to find more information:  National Heart, Lung, and [place]: https://wilson-eaton.com/  American Heart Association: www.heart.org Summary  The DASH eating plan is a healthy eating plan that has been shown to reduce high blood pressure (hypertension). It may also reduce your risk for type 2 diabetes, heart disease, and stroke.  With the DASH eating plan, you should limit salt (sodium) intake  to 2,300 mg a day. If you have hypertension, you may need to reduce your sodium intake to 1,500 mg a day.  When on the DASH eating plan, aim to eat more fresh fruits and vegetables, whole grains, lean proteins, low-fat dairy, and heart-healthy fats.  Work with your health care provider or diet and nutrition specialist (dietitian) to adjust your eating plan to your  individual calorie needs. This information is not intended to replace advice given to you by your health care provider. Make sure you discuss any questions you have with your health care provider. Document Released: 10/10/2011 Document Revised: 10/14/2016 Document Reviewed: 10/14/2016 Elsevier Interactive Patient Education  2017 [employer]

## 2017-09-27 DIAGNOSIS — R2689 Other abnormalities of gait and mobility: Secondary | ICD-10-CM | POA: Diagnosis not present

## 2017-09-27 DIAGNOSIS — S329XXA Fracture of unspecified parts of lumbosacral spine and pelvis, initial encounter for closed fracture: Secondary | ICD-10-CM | POA: Diagnosis not present

## 2017-09-27 DIAGNOSIS — M6281 Muscle weakness (generalized): Secondary | ICD-10-CM | POA: Diagnosis not present

## 2017-10-09 DIAGNOSIS — M6281 Muscle weakness (generalized): Secondary | ICD-10-CM | POA: Diagnosis not present

## 2017-10-09 DIAGNOSIS — R2689 Other abnormalities of gait and mobility: Secondary | ICD-10-CM | POA: Diagnosis not present

## 2017-10-09 DIAGNOSIS — S329XXA Fracture of unspecified parts of lumbosacral spine and pelvis, initial encounter for closed fracture: Secondary | ICD-10-CM | POA: Diagnosis not present

## 2017-10-27 DIAGNOSIS — M6281 Muscle weakness (generalized): Secondary | ICD-10-CM | POA: Diagnosis not present

## 2017-10-27 DIAGNOSIS — S329XXA Fracture of unspecified parts of lumbosacral spine and pelvis, initial encounter for closed fracture: Secondary | ICD-10-CM | POA: Diagnosis not present

## 2017-10-27 DIAGNOSIS — R2689 Other abnormalities of gait and mobility: Secondary | ICD-10-CM | POA: Diagnosis not present

## 2017-11-09 DIAGNOSIS — R2689 Other abnormalities of gait and mobility: Secondary | ICD-10-CM | POA: Diagnosis not present

## 2017-11-09 DIAGNOSIS — M6281 Muscle weakness (generalized): Secondary | ICD-10-CM | POA: Diagnosis not present

## 2017-11-09 DIAGNOSIS — S329XXA Fracture of unspecified parts of lumbosacral spine and pelvis, initial encounter for closed fracture: Secondary | ICD-10-CM | POA: Diagnosis not present

## 2017-11-17 ENCOUNTER — Encounter (HOSPITAL_COMMUNITY): Payer: Self-pay

## 2017-11-17 ENCOUNTER — Inpatient Hospital Stay (HOSPITAL_COMMUNITY)
Admission: EM | Admit: 2017-11-17 | Discharge: 2017-11-22 | DRG: 871 | Disposition: A | Discharge status: Home / Self Care | Payer: PPO | Attending: Internal Medicine | Admitting: Internal Medicine

## 2017-11-17 ENCOUNTER — Emergency Department (HOSPITAL_COMMUNITY): Payer: PPO

## 2017-11-17 DIAGNOSIS — E118 Type 2 diabetes mellitus with unspecified complications: Secondary | ICD-10-CM

## 2017-11-17 DIAGNOSIS — A4189 Other specified sepsis: Secondary | ICD-10-CM | POA: Diagnosis not present

## 2017-11-17 DIAGNOSIS — R404 Transient alteration of awareness: Secondary | ICD-10-CM | POA: Diagnosis not present

## 2017-11-17 DIAGNOSIS — G9340 Encephalopathy, unspecified: Secondary | ICD-10-CM | POA: Diagnosis not present

## 2017-11-17 DIAGNOSIS — J111 Influenza due to unidentified influenza virus with other respiratory manifestations: Secondary | ICD-10-CM | POA: Diagnosis not present

## 2017-11-17 DIAGNOSIS — M810 Age-related osteoporosis without current pathological fracture: Secondary | ICD-10-CM | POA: Diagnosis not present

## 2017-11-17 DIAGNOSIS — Z79899 Other long term (current) drug therapy: Secondary | ICD-10-CM

## 2017-11-17 DIAGNOSIS — Z791 Long term (current) use of non-steroidal anti-inflammatories (NSAID): Secondary | ICD-10-CM | POA: Diagnosis not present

## 2017-11-17 DIAGNOSIS — E876 Hypokalemia: Secondary | ICD-10-CM | POA: Diagnosis not present

## 2017-11-17 DIAGNOSIS — R509 Fever, unspecified: Secondary | ICD-10-CM | POA: Diagnosis not present

## 2017-11-17 DIAGNOSIS — R05 Cough: Secondary | ICD-10-CM | POA: Diagnosis not present

## 2017-11-17 DIAGNOSIS — E7800 Pure hypercholesterolemia, unspecified: Secondary | ICD-10-CM | POA: Diagnosis not present

## 2017-11-17 DIAGNOSIS — I5032 Chronic diastolic (congestive) heart failure: Secondary | ICD-10-CM

## 2017-11-17 DIAGNOSIS — Z885 Allergy status to narcotic agent status: Secondary | ICD-10-CM

## 2017-11-17 DIAGNOSIS — F329 Major depressive disorder, single episode, unspecified: Secondary | ICD-10-CM | POA: Diagnosis present

## 2017-11-17 DIAGNOSIS — G8929 Other chronic pain: Secondary | ICD-10-CM | POA: Diagnosis not present

## 2017-11-17 DIAGNOSIS — G9341 Metabolic encephalopathy: Secondary | ICD-10-CM | POA: Diagnosis not present

## 2017-11-17 DIAGNOSIS — Z7982 Long term (current) use of aspirin: Secondary | ICD-10-CM | POA: Diagnosis not present

## 2017-11-17 DIAGNOSIS — E119 Type 2 diabetes mellitus without complications: Secondary | ICD-10-CM | POA: Diagnosis present

## 2017-11-17 DIAGNOSIS — Z66 Do not resuscitate: Secondary | ICD-10-CM | POA: Diagnosis not present

## 2017-11-17 DIAGNOSIS — E861 Hypovolemia: Secondary | ICD-10-CM | POA: Diagnosis not present

## 2017-11-17 DIAGNOSIS — R8290 Unspecified abnormal findings in urine: Secondary | ICD-10-CM

## 2017-11-17 DIAGNOSIS — M549 Dorsalgia, unspecified: Secondary | ICD-10-CM | POA: Diagnosis not present

## 2017-11-17 DIAGNOSIS — D649 Anemia, unspecified: Secondary | ICD-10-CM | POA: Diagnosis not present

## 2017-11-17 DIAGNOSIS — J101 Influenza due to other identified influenza virus with other respiratory manifestations: Secondary | ICD-10-CM | POA: Diagnosis not present

## 2017-11-17 DIAGNOSIS — R531 Weakness: Secondary | ICD-10-CM | POA: Diagnosis not present

## 2017-11-17 DIAGNOSIS — E871 Hypo-osmolality and hyponatremia: Secondary | ICD-10-CM | POA: Diagnosis not present

## 2017-11-17 DIAGNOSIS — R0602 Shortness of breath: Secondary | ICD-10-CM | POA: Diagnosis not present

## 2017-11-17 LAB — URINALYSIS, ROUTINE W REFLEX MICROSCOPIC
Bilirubin Urine: NEGATIVE
Glucose, UA: NEGATIVE mg/dL
Hgb urine dipstick: NEGATIVE
Ketones, ur: 5 mg/dL — AB
Leukocytes, UA: NEGATIVE
Nitrite: NEGATIVE
PROTEIN: 100 mg/dL — AB
SPECIFIC GRAVITY, URINE: 1.017 (ref 1.005–1.030)
Squamous Epithelial / LPF: NONE SEEN
pH: 6 (ref 5.0–8.0)

## 2017-11-17 LAB — COMPREHENSIVE METABOLIC PANEL
ALT: 16 U/L (ref 14–54)
ANION GAP: 9 (ref 5–15)
AST: 39 U/L (ref 15–41)
Albumin: 3.8 g/dL (ref 3.5–5.0)
Alkaline Phosphatase: 63 U/L (ref 38–126)
BILIRUBIN TOTAL: 0.7 mg/dL (ref 0.3–1.2)
BUN: 13 mg/dL (ref 6–20)
CO2: 24 mmol/L (ref 22–32)
Calcium: 8.1 mg/dL — ABNORMAL LOW (ref 8.9–10.3)
Chloride: 95 mmol/L — ABNORMAL LOW (ref 101–111)
Creatinine, Ser: 0.86 mg/dL (ref 0.44–1.00)
GFR, EST NON AFRICAN AMERICAN: 57 mL/min — AB (ref >60)
Glucose, Bld: 126 mg/dL — ABNORMAL HIGH (ref 65–99)
POTASSIUM: 4.4 mmol/L (ref 3.5–5.1)
Sodium: 128 mmol/L — ABNORMAL LOW (ref 135–145)
TOTAL PROTEIN: 6.6 g/dL (ref 6.5–8.1)

## 2017-11-17 LAB — CBC WITH DIFFERENTIAL/PLATELET
Basophils Absolute: 0 10*3/uL (ref 0.0–0.1)
Basophils Relative: 1 %
EOS ABS: 0 10*3/uL (ref 0.0–0.7)
Eosinophils Relative: 0 %
HEMATOCRIT: 31.6 % — AB (ref 36.0–46.0)
Hemoglobin: 10.6 g/dL — ABNORMAL LOW (ref 12.0–15.0)
LYMPHS PCT: 20 %
Lymphs Abs: 0.9 10*3/uL (ref 0.7–4.0)
MCH: 32.1 pg (ref 26.0–34.0)
MCHC: 33.5 g/dL (ref 30.0–36.0)
MCV: 95.8 fL (ref 78.0–100.0)
MONOS PCT: 22 %
Monocytes Absolute: 1 10*3/uL (ref 0.1–1.0)
NEUTROS ABS: 2.6 10*3/uL (ref 1.7–7.7)
NEUTROS PCT: 57 %
Platelets: 213 10*3/uL (ref 150–400)
RBC: 3.3 MIL/uL — AB (ref 3.87–5.11)
RDW: 14.6 % (ref 11.5–15.5)
WBC: 4.4 10*3/uL (ref 4.0–10.5)

## 2017-11-17 LAB — INFLUENZA PANEL BY PCR (TYPE A & B)
INFLAPCR: POSITIVE — AB
INFLBPCR: NEGATIVE

## 2017-11-17 MED ORDER — OSELTAMIVIR PHOSPHATE 75 MG PO CAPS
75.0000 mg | ORAL_CAPSULE | Freq: Two times a day (BID) | ORAL | Status: DC
  Administered 2017-11-18: 75 mg via ORAL
  Filled 2017-11-17 (×2): qty 1

## 2017-11-17 MED ORDER — SODIUM CHLORIDE 0.9 % IV BOLUS (SEPSIS)
500.0000 mL | Freq: Once | INTRAVENOUS | Status: AC
  Administered 2017-11-17: 500 mL via INTRAVENOUS

## 2017-11-17 NOTE — ED Notes (Signed)
Bed: WA20 Expected date:  Expected time:  Means of arrival:  Comments: Hold for 6

## 2017-11-17 NOTE — ED Triage Notes (Signed)
Patient BIB EMS for fever and generalized weakness

## 2017-11-17 NOTE — ED Notes (Signed)
Bed: WHALA Expected date:  Expected time:  Means of arrival:  Comments: 

## 2017-11-17 NOTE — H&P (Signed)
History and Physical    Stephanie Jenkins:[id number] DOB: Nov 07, 1922 DOA: 11/17/2017  Referring MD/NP/PA: Dr. Benjiman Core PCP: Richmond Campbell., PA-C  Patient coming [facility]:  [facility]  Chief Complaint: Weakness  I have personally briefly reviewed patient's old medical records in [facility]  HPI: Stephanie Jenkins is a 84 y.o. female with medical history significant of DM type II, HLD, osteoporosis; who presented with complaints of generalized weakness.  History is obtained from patient and her daughter present at bedside.  She had not been feeling well since this morning.  Daughter notes that she was more lethargic sleeping most of the day away.  Reports associated symptoms of a mostly nonproductive cough for the last 3 days, urinary incontinence, generalized weakness, mild confusion, and feeling warm to the touch.  She had not received a flu vaccine this year.  Denies any chest pain, nausea, vomiting, diarrhea, or dysuria.  Denies any known sick contacts.  ED Course: Upon admission into the emergency department patient was noted to be febrile up to 101.1 F, pulse 65-90, respirations 8-20, blood pressures 132/102-150 5/63, and O2 saturations 93-98% on room air.  Labs revealed WBC 4.4, hemoglobin 10.6, sodium 128, and screen positive for influenza A.  Urinalysis noted many bacteria, but was negative for nitrites or leukocytes.  Patient was given 500 mL of normal saline fluids.  TRH called to admit.    Review of Systems  Constitutional: Positive for fever.  HENT: Negative for ear pain and nosebleeds.   Eyes: Negative for pain and discharge.  Respiratory: Negative for cough and wheezing. Sputum production: Minimal.   Cardiovascular: Negative for chest pain and leg swelling.  Gastrointestinal: Negative for abdominal pain and vomiting.  Genitourinary: Negative for dysuria.       Positive for urinary incontinence  Musculoskeletal: Positive for joint pain and myalgias. Negative for  falls.  Skin: Negative for itching and rash.  Neurological: Positive for weakness. Negative for sensory change and seizures.  Psychiatric/Behavioral: Negative for suicidal ideas. The patient is not nervous/anxious.     Past Medical History:  Diagnosis Date  . Carotid bruit    bilateral  . Closed pelvic fracture (HCC) 05/05/2017  . DEGENERATIVE JOINT DISEASE 07/28/2008  . DIABETES MELLITUS, TYPE II, CONTROLLED 07/27/2007  . HYPERCHOLESTEROLEMIA, MILD 07/27/2007  . Impaired glucose tolerance 08/07/2012  . OSTEOPOROSIS 07/14/2007  . Pelvic fracture (HCC) 04/04/2017    Past Surgical History:  Procedure Laterality Date  . ABDOMINAL HYSTERECTOMY    . CATARACT EXTRACTION    . TONSILLECTOMY       reports that  has never smoked. she has never used smokeless tobacco. She reports that she does not drink alcohol or use drugs.  Allergies  Allergen Reactions  . Hydrocodone-Acetaminophen     Other reaction(s): Other (See Comments) Talking out of her head.  . Oxycodone     Other reaction(s): Other (See Comments) Talks out of her head    Family History  Problem Relation Age of Onset  . Hypertension Other     Prior to Admission medications   Medication Sig Start Date End Date Taking? Authorizing Provider  aspirin 81 MG tablet Take 81 mg by mouth daily.     Yes [provider]  cholecalciferol (VITAMIN D) 400 units TABS tablet Take 800 Units by mouth daily.   Yes [provider]  furosemide (LASIX) 20 MG tablet Take 1 tablet (20 mg total) by mouth daily. 08/04/17 11/17/17 Yes Turner, Traci R,  MD  Glucosamine-Chondroit-Vit C-Mn (GLUCOSAMINE CHONDR 1500 COMPLX PO) Take 1 tablet by mouth 2 (two) times daily.    Yes [provider]  meloxicam (MOBIC) 15 MG tablet TAKE 1 TABLET(15 MG) BY MOUTH DAILY 05/15/17  Yes Gordy SaversKwiatkowski, Peter F, MD  methocarbamol (ROBAXIN) 500 MG tablet Take 1 tablet (500 mg total) by mouth every 8 (eight) hours as needed for muscle spasms. Patient  taking differently: Take 500 mg by mouth 2 (two) times daily.  05/08/17  Yes Noralee Stainhoi, Jennifer, DO  Multiple Vitamin (MULTIVITAMIN) tablet Take 1 tablet by mouth daily.     Yes [provider]  polyethylene glycol (MIRALAX / GLYCOLAX) packet Take 17 g by mouth 2 (two) times daily. 05/06/17  Yes Richarda OverlieAbrol, Nayana, MD  sertraline (ZOLOFT) 50 MG tablet Take 50 mg by mouth daily. 07/24/17  Yes [provider]  traMADol (ULTRAM) 50 MG tablet Take 1 tablet (50 mg total) by mouth every 6 (six) hours as needed. 05/16/17  Yes Gordy SaversKwiatkowski, Peter F, MD  fentaNYL (DURAGESIC - DOSED MCG/HR) 25 MCG/HR patch Place 1 patch (25 mcg total) onto the skin every 3 (three) days. Patient not taking: Reported on 11/17/2017 05/23/17   Gordy SaversKwiatkowski, Peter F, MD    Physical Exam:  Constitutional: Elderly female who appears acutely sick, but nontoxic. Vitals:   11/17/17 1930 11/17/17 2200 11/17/17 2245 11/17/17 2255  BP:    (!) 155/63  Pulse:  71 65 65  Resp:  20 (!) 8 13  Temp: 100.2 F (37.9 C)     TempSrc: Oral     SpO2:  96% 95% 93%   Eyes: PERRL, lids and conjunctivae normal ENMT: Mucous membranes are dry. Posterior pharynx clear of any exudate or lesions.  Neck: normal, supple, no masses, no thyromegaly Respiratory: Rales appreciated in the mid to lower lung fields bilaterally.   No significant wheezes appreciated.  Respiratory effort within normal limits. Cardiovascular: Regular rate and rhythm, no murmurs / rubs / gallops.  Trace lower extremity edema. 2+ pedal pulses. No carotid bruits.  Abdomen: no tenderness, no masses palpated. No hepatosplenomegaly. Bowel sounds positive.  Musculoskeletal: no clubbing / cyanosis. No joint deformity upper and lower extremities. Good ROM, no contractures. Normal muscle tone.  Skin: no rashes, lesions, ulcers. No induration Neurologic: CN 2-12 grossly intact. Sensation intact, DTR normal. Strength 5/5 in all 4.  Psychiatric: Normal judgment and insight. Alert and  oriented x 3. Normal mood.     Labs on Admission: I have personally reviewed following labs and imaging studies  CBC: Recent Labs  Lab 11/17/17 2050  WBC 4.4  NEUTROABS 2.6  HGB 10.6*  HCT 31.6*  MCV 95.8  PLT 213   Basic Metabolic Panel: Recent Labs  Lab 11/17/17 2050  NA 128*  K 4.4  CL 95*  CO2 24  GLUCOSE 126*  BUN 13  CREATININE 0.86  CALCIUM 8.1*   GFR: CrCl cannot be calculated (Unknown ideal weight.). Liver Function Tests: Recent Labs  Lab 11/17/17 2050  AST 39  ALT 16  ALKPHOS 63  BILITOT 0.7  PROT 6.6  ALBUMIN 3.8   No results for input(s): LIPASE, AMYLASE in the last 168 hours. No results for input(s): AMMONIA in the last 168 hours. Coagulation Profile: No results for input(s): INR, PROTIME in the last 168 hours. Cardiac Enzymes: No results for input(s): CKTOTAL, CKMB, CKMBINDEX, TROPONINI in the last 168 hours. BNP (last 3 results) No results for input(s): PROBNP in the last 8760 hours. HbA1C: No results  for input(s): HGBA1C in the last 72 hours. CBG: No results for input(s): GLUCAP in the last 168 hours. Lipid Profile: No results for input(s): CHOL, HDL, LDLCALC, TRIG, CHOLHDL, LDLDIRECT in the last 72 hours. Thyroid Function Tests: No results for input(s): TSH, T4TOTAL, FREET4, T3FREE, THYROIDAB in the last 72 hours. Anemia Panel: No results for input(s): VITAMINB12, FOLATE, FERRITIN, TIBC, IRON, RETICCTPCT in the last 72 hours. Urine analysis:    Component Value Date/Time   COLORURINE AMBER (A) 11/17/2017 2009   APPEARANCEUR CLOUDY (A) 11/17/2017 2009   LABSPEC 1.017 11/17/2017 2009   PHURINE 6.0 11/17/2017 2009   GLUCOSEU NEGATIVE 11/17/2017 2009   HGBUR NEGATIVE 11/17/2017 2009   HGBUR 2+ 07/27/2007 0000   BILIRUBINUR NEGATIVE 11/17/2017 2009   BILIRUBINUR neg 05/02/2017 1110   KETONESUR 5 (A) 11/17/2017 2009   PROTEINUR 100 (A) 11/17/2017 2009   UROBILINOGEN 0.2 05/02/2017 1110   UROBILINOGEN 0.2 07/27/2007 0000   NITRITE  NEGATIVE 11/17/2017 2009   LEUKOCYTESUR NEGATIVE 11/17/2017 2009   Sepsis Labs: No results found for this or any previous visit (from the past 240 hour(s)).   Radiological Exams on Admission: Dg Chest 2 View  Result Date: 11/17/2017 CLINICAL DATA:  Cough and fever EXAM: CHEST  2 VIEW COMPARISON:  None FINDINGS: The heart size and mediastinal contours are within normal limits. Decreased lung volumes. The visualized skeletal structures are unremarkable. IMPRESSION: No active cardiopulmonary disease. Electronically Signed   By: Signa Kell M.D.   On: 11/17/2017 21:23    Chest x-ray: Independently reviewed.  No clear signs of infiltrate noted  Assessment/Plan Influenza A: Acute.  Patient presents febrile up to 101.1 F with complaints of cough.  WBC 4.4 and no lactic acid initially checked.  Chest x-ray was otherwise noted to be clear.  Patient was found to be positive for influenza A.  - Admit to MedSurg bed  - Follow-up blood cultures - Add on lactic acid - Tamiflu  - Tylenol prn fever - Mucinex - Continue symptomatic treatment    Abnormal UA: Patient was found to have many bacteria, no other characteristics to suggest infection. - Follow-up urine culture  Diabetes mellitus type II: Patient appears to be diet controlled.  Last hemoglobin A1c noted to be 6 on 04/05/2017. - Continue to monitor  Hyponatremia: Acute on Chronic.  Sodium 128 on admission.  - IV fluids normal saline at 75 mL/h as tolerated - Recheck sodium in a.m.  Normocytic normochromic anemia: Chronic.  Patient hemoglobin 10.6 which appears near her baseline.  Denies any reports of bleeding. - Continue to monitor  Diastolic CHF: Chronic.  Last echocardiogram revealed EF noted to be 60-65% with grade 1 diastolic dysfunction on 08/2017.  Patient appears showed no acute signs of being fluid overloaded at this time.    DVT prophylaxis: lovenox Code Status: DNR  Family Communication: Discussed plan of care with the  patient and family present at bedside Disposition Plan: TBD  Consults called: None  Admission status: Inpatient   Clydie Braun MD [facility] Pager [phone]   If 7PM-7AM, please contact night-coverage [email or web address] Password [facility]  11/17/2017, 11:53 PM

## 2017-11-17 NOTE — ED Provider Notes (Signed)
[facility]-EMERGENCY DEPT Provider Note   CSN: [id number] Arrival date & time: 11/17/17  1910     History   Chief Complaint No chief complaint on file.   HPI Stephanie Jenkins is a 84 y.o. female.  HPI Patient presents with her daughter.  Presents with fever and generalized weakness.  Began this morning.  Has had a cough for the last few days.  Minimal sputum production.  No clear sick contacts but is been at the grocery store.  No nausea vomiting diarrhea.  No dysuria.  Has been having less energy and some mild confusion. Past Medical History:  Diagnosis Date  . Carotid bruit    bilateral  . Closed pelvic fracture (HCC) 05/05/2017  . DEGENERATIVE JOINT DISEASE 07/28/2008  . DIABETES MELLITUS, TYPE II, CONTROLLED 07/27/2007  . HYPERCHOLESTEROLEMIA, MILD 07/27/2007  . Impaired glucose tolerance 08/07/2012  . OSTEOPOROSIS 07/14/2007  . Pelvic fracture (HCC) 04/04/2017    Patient Active Problem List   Diagnosis Date Noted  . Chronic diastolic CHF (congestive heart failure) (HCC) 09/22/2017  . Edema extremities 08/04/2017  . DOE (dyspnea on exertion) 08/04/2017  . Closed pelvic fracture (HCC) 05/05/2017  . Pelvic fracture (HCC) 04/04/2017  . Impaired glucose tolerance 08/07/2012  . Osteoarthritis 07/28/2008  . HYPERCHOLESTEROLEMIA, MILD 07/27/2007  . Osteoporosis 07/14/2007    Past Surgical History:  Procedure Laterality Date  . ABDOMINAL HYSTERECTOMY    . CATARACT EXTRACTION    . TONSILLECTOMY      OB History    No data available       Home Medications    Prior to Admission medications   Medication Sig Start Date End Date Taking? Authorizing Provider  aspirin 81 MG tablet Take 81 mg by mouth daily.     Yes [provider]  cholecalciferol (VITAMIN D) 400 units TABS tablet Take 800 Units by mouth daily.   Yes [provider]  furosemide (LASIX) 20 MG tablet Take 1 tablet (20 mg total) by mouth daily. 08/04/17 11/17/17 Yes Turner,  Cornelious Bryantraci R, MD  Glucosamine-Chondroit-Vit C-Mn (GLUCOSAMINE CHONDR 1500 COMPLX PO) Take 1 tablet by mouth 2 (two) times daily.    Yes [provider]  meloxicam (MOBIC) 15 MG tablet TAKE 1 TABLET(15 MG) BY MOUTH DAILY 05/15/17  Yes Gordy SaversKwiatkowski, Peter F, MD  methocarbamol (ROBAXIN) 500 MG tablet Take 1 tablet (500 mg total) by mouth every 8 (eight) hours as needed for muscle spasms. Patient taking differently: Take 500 mg by mouth 2 (two) times daily.  05/08/17  Yes Noralee Stainhoi, Jennifer, DO  Multiple Vitamin (MULTIVITAMIN) tablet Take 1 tablet by mouth daily.     Yes [provider]  polyethylene glycol (MIRALAX / GLYCOLAX) packet Take 17 g by mouth 2 (two) times daily. 05/06/17  Yes Richarda OverlieAbrol, Nayana, MD  sertraline (ZOLOFT) 50 MG tablet Take 50 mg by mouth daily. 07/24/17  Yes [provider]  traMADol (ULTRAM) 50 MG tablet Take 1 tablet (50 mg total) by mouth every 6 (six) hours as needed. 05/16/17  Yes Gordy SaversKwiatkowski, Peter F, MD  fentaNYL (DURAGESIC - DOSED MCG/HR) 25 MCG/HR patch Place 1 patch (25 mcg total) onto the skin every 3 (three) days. Patient not taking: Reported on 11/17/2017 05/23/17   Gordy SaversKwiatkowski, Peter F, MD    Family History Family History  Problem Relation Age of Onset  . Hypertension Other     Social History Social History   Tobacco Use  . Smoking status: Never Smoker  . Smokeless tobacco: Never  Used  Substance Use Topics  . Alcohol use: No  . Drug use: No     Allergies   Hydrocodone-acetaminophen and Oxycodone   Review of Systems Review of Systems  Constitutional: Positive for appetite change.  HENT: Negative for congestion and sinus pressure.   Respiratory: Positive for cough and shortness of breath.   Cardiovascular: Negative for chest pain.  Gastrointestinal: Negative for abdominal pain.  Genitourinary: Negative for dysuria.  Musculoskeletal: Negative for back pain.  Skin: Negative for rash.  Neurological: Negative for seizures.    Psychiatric/Behavioral: Negative for confusion.     Physical Exam Updated Vital Signs BP (!) 155/63 (BP Location: Left Arm)   Pulse 65   Temp 100.2 F (37.9 C) (Oral)   Resp 13   SpO2 93%   Physical Exam  Constitutional: She appears well-developed.  HENT:  Head: Normocephalic.  Eyes: Pupils are equal, round, and reactive to light.  Neck: Neck supple.  Cardiovascular: Normal rate.  Pulmonary/Chest: Effort normal.  Mildly harsh breath sounds without focal rales or rhonchi.  Abdominal: There is no tenderness.  Musculoskeletal: She exhibits edema.  Neurological: She is alert.  Skin: Skin is warm. Capillary refill takes less than 2 seconds.     ED Treatments / Results  Labs (all labs ordered are listed, but only abnormal results are displayed) Labs Reviewed  COMPREHENSIVE METABOLIC PANEL - Abnormal; Notable for the following components:      Result Value   Sodium 128 (*)    Chloride 95 (*)    Glucose, Bld 126 (*)    Calcium 8.1 (*)    GFR calc non Af Amer 57 (*)    All other components within normal limits  CBC WITH DIFFERENTIAL/PLATELET - Abnormal; Notable for the following components:   RBC 3.30 (*)    Hemoglobin 10.6 (*)    HCT 31.6 (*)    All other components within normal limits  URINALYSIS, ROUTINE W REFLEX MICROSCOPIC - Abnormal; Notable for the following components:   Color, Urine AMBER (*)    APPearance CLOUDY (*)    Ketones, ur 5 (*)    Protein, ur 100 (*)    Bacteria, UA MANY (*)    All other components within normal limits  CULTURE, BLOOD (ROUTINE X 2)  CULTURE, BLOOD (ROUTINE X 2)  URINE CULTURE  INFLUENZA PANEL BY PCR (TYPE A & B)    EKG  EKG Interpretation None       Radiology Dg Chest 2 View  Result Date: 11/17/2017 CLINICAL DATA:  Cough and fever EXAM: CHEST  2 VIEW COMPARISON:  None FINDINGS: The heart size and mediastinal contours are within normal limits. Decreased lung volumes. The visualized skeletal structures are unremarkable.  IMPRESSION: No active cardiopulmonary disease. Electronically Signed   By: Signa Kell M.D.   On: 11/17/2017 21:23    Procedures Procedures (including critical care time)  Medications Ordered in ED Medications  sodium chloride 0.9 % bolus 500 mL (500 mLs Intravenous New Bag/Given 11/17/17 2242)     Initial Impression / Assessment and Plan / ED Course  I have reviewed the triage vital signs and the nursing notes.  Pertinent labs & imaging results that were available during my care of the patient were reviewed by me and considered in my medical decision making (see chart for details).     Patient with fever.  URI symptoms.  X-ray reassuring.  Flu test still pending.  However has been somewhat encephalopathic with this.  Doubt this is  a meningitis.  Urine reassuring.  Some dehydration with hyponatremia.  Will admit to hospitalist.  Final Clinical Impressions(s) / ED Diagnoses   Final diagnoses:  Fever, unspecified fever cause  Flu-like symptoms  Encephalopathy    ED Discharge Orders    None       Benjiman Core, MD 11/17/17 2332

## 2017-11-18 ENCOUNTER — Other Ambulatory Visit: Payer: Self-pay

## 2017-11-18 DIAGNOSIS — J101 Influenza due to other identified influenza virus with other respiratory manifestations: Secondary | ICD-10-CM | POA: Diagnosis present

## 2017-11-18 DIAGNOSIS — D649 Anemia, unspecified: Secondary | ICD-10-CM | POA: Diagnosis present

## 2017-11-18 DIAGNOSIS — I5032 Chronic diastolic (congestive) heart failure: Secondary | ICD-10-CM | POA: Diagnosis present

## 2017-11-18 DIAGNOSIS — E871 Hypo-osmolality and hyponatremia: Secondary | ICD-10-CM | POA: Diagnosis present

## 2017-11-18 DIAGNOSIS — M549 Dorsalgia, unspecified: Secondary | ICD-10-CM | POA: Diagnosis present

## 2017-11-18 DIAGNOSIS — E118 Type 2 diabetes mellitus with unspecified complications: Secondary | ICD-10-CM | POA: Diagnosis not present

## 2017-11-18 DIAGNOSIS — E119 Type 2 diabetes mellitus without complications: Secondary | ICD-10-CM

## 2017-11-18 DIAGNOSIS — G9340 Encephalopathy, unspecified: Secondary | ICD-10-CM

## 2017-11-18 DIAGNOSIS — G9341 Metabolic encephalopathy: Secondary | ICD-10-CM | POA: Diagnosis not present

## 2017-11-18 DIAGNOSIS — R8290 Unspecified abnormal findings in urine: Secondary | ICD-10-CM | POA: Diagnosis present

## 2017-11-18 DIAGNOSIS — Z791 Long term (current) use of non-steroidal anti-inflammatories (NSAID): Secondary | ICD-10-CM | POA: Diagnosis not present

## 2017-11-18 DIAGNOSIS — M810 Age-related osteoporosis without current pathological fracture: Secondary | ICD-10-CM | POA: Diagnosis present

## 2017-11-18 DIAGNOSIS — Z885 Allergy status to narcotic agent status: Secondary | ICD-10-CM | POA: Diagnosis not present

## 2017-11-18 DIAGNOSIS — E7800 Pure hypercholesterolemia, unspecified: Secondary | ICD-10-CM | POA: Diagnosis present

## 2017-11-18 DIAGNOSIS — E861 Hypovolemia: Secondary | ICD-10-CM | POA: Diagnosis present

## 2017-11-18 DIAGNOSIS — A4189 Other specified sepsis: Secondary | ICD-10-CM | POA: Diagnosis present

## 2017-11-18 DIAGNOSIS — F329 Major depressive disorder, single episode, unspecified: Secondary | ICD-10-CM | POA: Diagnosis present

## 2017-11-18 DIAGNOSIS — E876 Hypokalemia: Secondary | ICD-10-CM | POA: Diagnosis not present

## 2017-11-18 DIAGNOSIS — Z79899 Other long term (current) drug therapy: Secondary | ICD-10-CM | POA: Diagnosis not present

## 2017-11-18 DIAGNOSIS — G8929 Other chronic pain: Secondary | ICD-10-CM | POA: Diagnosis present

## 2017-11-18 DIAGNOSIS — Z7982 Long term (current) use of aspirin: Secondary | ICD-10-CM | POA: Diagnosis not present

## 2017-11-18 DIAGNOSIS — Z66 Do not resuscitate: Secondary | ICD-10-CM | POA: Diagnosis present

## 2017-11-18 LAB — CBC
HCT: 33 % — ABNORMAL LOW (ref 36.0–46.0)
Hemoglobin: 11.2 g/dL — ABNORMAL LOW (ref 12.0–15.0)
MCH: 32.5 pg (ref 26.0–34.0)
MCHC: 33.9 g/dL (ref 30.0–36.0)
MCV: 95.7 fL (ref 78.0–100.0)
PLATELETS: 209 10*3/uL (ref 150–400)
RBC: 3.45 MIL/uL — AB (ref 3.87–5.11)
RDW: 14.9 % (ref 11.5–15.5)
WBC: 5.4 10*3/uL (ref 4.0–10.5)

## 2017-11-18 LAB — BASIC METABOLIC PANEL
ANION GAP: 10 (ref 5–15)
BUN: 13 mg/dL (ref 6–20)
CALCIUM: 8.4 mg/dL — AB (ref 8.9–10.3)
CO2: 24 mmol/L (ref 22–32)
Chloride: 94 mmol/L — ABNORMAL LOW (ref 101–111)
Creatinine, Ser: 0.75 mg/dL (ref 0.44–1.00)
GFR calc Af Amer: >60 mL/min (ref >60)
GFR calc non Af Amer: >60 mL/min (ref >60)
Glucose, Bld: 110 mg/dL — ABNORMAL HIGH (ref 65–99)
POTASSIUM: 3.8 mmol/L (ref 3.5–5.1)
Sodium: 128 mmol/L — ABNORMAL LOW (ref 135–145)

## 2017-11-18 LAB — GLUCOSE, CAPILLARY
Glucose-Capillary: 129 mg/dL — ABNORMAL HIGH (ref 65–99)
Glucose-Capillary: 121 mg/dL — ABNORMAL HIGH (ref 65–99)

## 2017-11-18 LAB — LACTIC ACID, PLASMA: LACTIC ACID, VENOUS: 1.6 mmol/L (ref 0.5–1.9)

## 2017-11-18 MED ORDER — POLYETHYLENE GLYCOL 3350 17 G PO PACK
17.0000 g | PACK | Freq: Two times a day (BID) | ORAL | Status: DC
  Administered 2017-11-18 – 2017-11-21 (×4): 17 g via ORAL
  Filled 2017-11-18 (×4): qty 1

## 2017-11-18 MED ORDER — SODIUM CHLORIDE 0.9 % IV SOLN
INTRAVENOUS | Status: DC
  Administered 2017-11-18 – 2017-11-19 (×2): via INTRAVENOUS

## 2017-11-18 MED ORDER — DEXTROSE-NACL 5-0.45 % IV SOLN: INTRAVENOUS | Status: DC

## 2017-11-18 MED ORDER — ALBUTEROL SULFATE (2.5 MG/3ML) 0.083% IN NEBU: 2.5000 mg | INHALATION_SOLUTION | Freq: Four times a day (QID) | RESPIRATORY_TRACT | Status: DC | PRN

## 2017-11-18 MED ORDER — ONDANSETRON HCL 4 MG/2ML IJ SOLN: 4.0000 mg | Freq: Four times a day (QID) | INTRAMUSCULAR | Status: DC | PRN

## 2017-11-18 MED ORDER — METHOCARBAMOL 500 MG PO TABS
500.0000 mg | ORAL_TABLET | Freq: Two times a day (BID) | ORAL | Status: DC | PRN
Start: 2017-11-18 — End: 2017-11-22
  Administered 2017-11-18 – 2017-11-20 (×5): 500 mg via ORAL
  Filled 2017-11-18 (×5): qty 1

## 2017-11-18 MED ORDER — ASPIRIN EC 81 MG PO TBEC
81.0000 mg | DELAYED_RELEASE_TABLET | Freq: Every day | ORAL | Status: DC
  Administered 2017-11-18 – 2017-11-21 (×4): 81 mg via ORAL
  Filled 2017-11-18 (×4): qty 1

## 2017-11-18 MED ORDER — INSULIN ASPART 100 UNIT/ML ~~LOC~~ SOLN
0.0000 [IU] | Freq: Three times a day (TID) | SUBCUTANEOUS | Status: DC
  Administered 2017-11-18 – 2017-11-20 (×2): 1 [IU] via SUBCUTANEOUS

## 2017-11-18 MED ORDER — ONDANSETRON HCL 4 MG PO TABS: 4.0000 mg | ORAL_TABLET | Freq: Four times a day (QID) | ORAL | Status: DC | PRN

## 2017-11-18 MED ORDER — FUROSEMIDE 20 MG PO TABS
20.0000 mg | ORAL_TABLET | Freq: Every day | ORAL | Status: DC
  Administered 2017-11-18: 11:00:00 20 mg via ORAL
  Filled 2017-11-18: qty 1

## 2017-11-18 MED ORDER — ENOXAPARIN SODIUM 40 MG/0.4ML ~~LOC~~ SOLN
40.0000 mg | SUBCUTANEOUS | Status: DC
  Administered 2017-11-18 – 2017-11-21 (×4): 40 mg via SUBCUTANEOUS
  Filled 2017-11-18 (×5): qty 0.4

## 2017-11-18 MED ORDER — TRAMADOL HCL 50 MG PO TABS
50.0000 mg | ORAL_TABLET | Freq: Four times a day (QID) | ORAL | Status: DC | PRN
  Administered 2017-11-18: 04:00:00 50 mg via ORAL
  Filled 2017-11-18: qty 1

## 2017-11-18 MED ORDER — GUAIFENESIN ER 600 MG PO TB12
600.0000 mg | ORAL_TABLET | Freq: Two times a day (BID) | ORAL | Status: DC
  Administered 2017-11-18 – 2017-11-21 (×9): 600 mg via ORAL
  Filled 2017-11-18 (×9): qty 1

## 2017-11-18 MED ORDER — TRAMADOL HCL 50 MG PO TABS
50.0000 mg | ORAL_TABLET | Freq: Three times a day (TID) | ORAL | Status: DC
  Administered 2017-11-18 – 2017-11-21 (×10): 50 mg via ORAL
  Filled 2017-11-18 (×10): qty 1

## 2017-11-18 MED ORDER — SODIUM CHLORIDE 0.9 % IV SOLN
Freq: Once | INTRAVENOUS | Status: AC
  Administered 2017-11-18: 04:00:00 via INTRAVENOUS

## 2017-11-18 MED ORDER — LIP MEDEX EX OINT
TOPICAL_OINTMENT | CUTANEOUS | Status: AC
  Administered 2017-11-18: 07:00:00
  Filled 2017-11-18: qty 7

## 2017-11-18 MED ORDER — MELOXICAM 15 MG PO TABS
15.0000 mg | ORAL_TABLET | Freq: Every day | ORAL | Status: DC
  Administered 2017-11-18: 15 mg via ORAL
  Filled 2017-11-18: qty 1

## 2017-11-18 MED ORDER — SERTRALINE HCL 50 MG PO TABS
50.0000 mg | ORAL_TABLET | Freq: Every day | ORAL | Status: DC
  Administered 2017-11-18 – 2017-11-21 (×4): 50 mg via ORAL
  Filled 2017-11-18 (×4): qty 1

## 2017-11-18 MED ORDER — IBUPROFEN 200 MG PO TABS
200.0000 mg | ORAL_TABLET | Freq: Three times a day (TID) | ORAL | Status: DC
  Administered 2017-11-18 – 2017-11-21 (×11): 200 mg via ORAL
  Filled 2017-11-18 (×11): qty 1

## 2017-11-18 MED ORDER — TRAMADOL HCL 50 MG PO TABS
50.0000 mg | ORAL_TABLET | Freq: Four times a day (QID) | ORAL | Status: DC | PRN
  Administered 2017-11-18: 50 mg via ORAL
  Filled 2017-11-18: qty 1

## 2017-11-18 MED ORDER — ACETAMINOPHEN 325 MG PO TABS
650.0000 mg | ORAL_TABLET | Freq: Four times a day (QID) | ORAL | Status: DC | PRN
Start: 2017-11-18 — End: 2017-11-22
  Administered 2017-11-18 – 2017-11-22 (×3): 650 mg via ORAL
  Filled 2017-11-18 (×3): qty 2

## 2017-11-18 MED ORDER — OSELTAMIVIR PHOSPHATE 30 MG PO CAPS
30.0000 mg | ORAL_CAPSULE | Freq: Two times a day (BID) | ORAL | Status: DC
  Administered 2017-11-18 – 2017-11-21 (×8): 30 mg via ORAL
  Filled 2017-11-18 (×10): qty 1

## 2017-11-18 MED ORDER — ACETAMINOPHEN 650 MG RE SUPP: 650.0000 mg | Freq: Four times a day (QID) | RECTAL | Status: DC | PRN

## 2017-11-18 NOTE — Progress Notes (Signed)
PHARMACY NOTE:  ANTIMICROBIAL RENAL DOSAGE ADJUSTMENT  Current antimicrobial regimen includes a mismatch between antimicrobial dosage and estimated renal function. As per policy approved by the Pharmacy & Therapeutics and Medical Executive Committees, the antimicrobial dosage will be adjusted accordingly.  Current antimicrobial and dosage:  Tamiflu 75 mg q12 hr  Indication: Influenza  Renal Function:   CrCl cannot be calculated (Unknown ideal weight.). []      On intermittent HD, scheduled: []      On CRRT    Antimicrobial dosage has been changed to:  30 mg bid   Additional Comments: CrCl calculated from weight of 72 kg in 09/2017, and rounding SCr to 1.0 for age = 39 ml/min   Thank you for allowing pharmacy to be a part of this patient's care.  Bernadene Personrew Isaid Salvia, PharmD, BCPS Pager: [phone] 11/18/2017, 7:26 AM

## 2017-11-18 NOTE — Plan of Care (Signed)
  Pain Managment: General experience of comfort will improve 11/18/2017 0448 - Progressing by Antionette CharPeng, Lemoine Goyne P, RN

## 2017-11-18 NOTE — ED Notes (Signed)
This RN is also getting the previous patient.  Can we please space them a little? Thank you!

## 2017-11-18 NOTE — ED Notes (Signed)
ED TO INPATIENT HANDOFF REPORT  Name/Age/Gender Stephanie Jenkins 84 y.o. female  Code Status    Code Status Orders  (From admission, onward)        Start     Ordered   11/18/17 0134  Do not attempt resuscitation (DNR)  Continuous    Question Answer Comment  In the event of cardiac or respiratory ARREST Do not call a "code blue"   In the event of cardiac or respiratory ARREST Do not perform Intubation, CPR, defibrillation or ACLS   In the event of cardiac or respiratory ARREST Use medication by any route, position, wound care, and other measures to relive pain and suffering. May use oxygen, suction and manual treatment of airway obstruction as needed for comfort.      11/18/17 0135    Code Status History    Date Active Date Inactive Code Status Order ID Comments User Context   05/05/2017 00:47 05/08/2017 15:16 DNR [id number]  Rise Patience, MD ED   04/04/2017 18:03 04/07/2017 17:24 DNR [id number]  Caren Griffins, MD ED      Home/SNF/Other Home  Chief Complaint General Weakness,Fever  Level of Care/Admitting Diagnosis ED Disposition    ED Disposition Condition [place]: [facility] [[id number]]  Level of Care: Med-Surg [16]  Diagnosis: Influenza A [097353]  Admitting Physician: Norval Morton [[id number]]  Attending Physician: Norval Morton [[id number]]  Estimated length of stay: past midnight tomorrow  Certification:: I certify this patient will need inpatient services for at least 2 midnights  PT Class (Do Not Modify): Inpatient [101]  PT Acc Code (Do Not Modify): Private [1]       Medical History Past Medical History:  Diagnosis Date  . Carotid bruit    bilateral  . Closed pelvic fracture ([place]) 05/05/2017  . DEGENERATIVE JOINT DISEASE 07/28/2008  . DIABETES MELLITUS, TYPE II, CONTROLLED 07/27/2007  . HYPERCHOLESTEROLEMIA, MILD 07/27/2007  . Impaired glucose tolerance 08/07/2012  . OSTEOPOROSIS 07/14/2007  . Pelvic fracture  (HCC) 04/04/2017    Allergies Allergies  Allergen Reactions  . Hydrocodone-Acetaminophen     Other reaction(s): Other (See Comments) Talking out of her head.  . Oxycodone     Other reaction(s): Other (See Comments) Talks out of her head    IV Location/Drains/Wounds Patient Lines/Drains/Airways Status   Active Line/Drains/Airways    Name:   Placement date:   Placement time:   Site:   Days:   External Urinary Catheter   05/06/17    0540    -   196          Labs/Imaging Results for orders placed or performed during the hospital encounter of 11/17/17 (from the past 48 hour(s))  Urinalysis, Routine w reflex microscopic     Status: Abnormal   Collection Time: 11/17/17  8:09 PM  Result Value Ref Range   Color, Urine AMBER (A) YELLOW    Comment: BIOCHEMICALS MAY BE AFFECTED BY COLOR   APPearance CLOUDY (A) CLEAR   Specific Gravity, Urine 1.017 1.005 - 1.030   pH 6.0 5.0 - 8.0   Glucose, UA NEGATIVE NEGATIVE mg/dL   Hgb urine dipstick NEGATIVE NEGATIVE   Bilirubin Urine NEGATIVE NEGATIVE   Ketones, ur 5 (A) NEGATIVE mg/dL   Protein, ur 100 (A) NEGATIVE mg/dL   Nitrite NEGATIVE NEGATIVE   Leukocytes, UA NEGATIVE NEGATIVE   RBC / HPF 0-5 0 - 5 RBC/hpf   WBC, UA 0-5 0 - 5 WBC/hpf  Bacteria, UA MANY (A) NONE SEEN   Squamous Epithelial / LPF NONE SEEN NONE SEEN  Influenza panel by PCR (type A & B)     Status: Abnormal   Collection Time: 11/17/17  8:15 PM  Result Value Ref Range   Influenza A By PCR POSITIVE (A) NEGATIVE   Influenza B By PCR NEGATIVE NEGATIVE    Comment: (NOTE) The Xpert Xpress Flu assay is intended as an aid in the diagnosis of  influenza and should not be used as a sole basis for treatment.  This  assay is FDA approved for nasopharyngeal swab specimens only. Nasal  washings and aspirates are unacceptable for Xpert Xpress Flu testing.   Comprehensive metabolic panel     Status: Abnormal   Collection Time: 11/17/17  8:50 PM  Result Value Ref Range   Sodium  128 (L) 135 - 145 mmol/L   Potassium 4.4 3.5 - 5.1 mmol/L   Chloride 95 (L) 101 - 111 mmol/L   CO2 24 22 - 32 mmol/L   Glucose, Bld 126 (H) 65 - 99 mg/dL   BUN 13 6 - 20 mg/dL   Creatinine, Ser 0.86 0.44 - 1.00 mg/dL   Calcium 8.1 (L) 8.9 - 10.3 mg/dL   Total Protein 6.6 6.5 - 8.1 g/dL   Albumin 3.8 3.5 - 5.0 g/dL   AST 39 15 - 41 U/L   ALT 16 14 - 54 U/L   Alkaline Phosphatase 63 38 - 126 U/L   Total Bilirubin 0.7 0.3 - 1.2 mg/dL   GFR calc non Af Amer 57 (L) >60 mL/min   GFR calc Af Amer >60 >60 mL/min    Comment: (NOTE) The eGFR has been calculated using the CKD EPI equation. This calculation has not been validated in all clinical situations. eGFR's persistently <60 mL/min signify possible Chronic Kidney Disease.    Anion gap 9 5 - 15  CBC with Differential     Status: Abnormal   Collection Time: 11/17/17  8:50 PM  Result Value Ref Range   WBC 4.4 4.0 - 10.5 K/uL   RBC 3.30 (L) 3.87 - 5.11 MIL/uL   Hemoglobin 10.6 (L) 12.0 - 15.0 g/dL   HCT 31.6 (L) 36.0 - 46.0 %   MCV 95.8 78.0 - 100.0 fL   MCH 32.1 26.0 - 34.0 pg   MCHC 33.5 30.0 - 36.0 g/dL   RDW 14.6 11.5 - 15.5 %   Platelets 213 150 - 400 K/uL   Neutrophils Relative % 57 %   Neutro Abs 2.6 1.7 - 7.7 K/uL   Lymphocytes Relative 20 %   Lymphs Abs 0.9 0.7 - 4.0 K/uL   Monocytes Relative 22 %   Monocytes Absolute 1.0 0.1 - 1.0 K/uL   Eosinophils Relative 0 %   Eosinophils Absolute 0.0 0.0 - 0.7 K/uL   Basophils Relative 1 %   Basophils Absolute 0.0 0.0 - 0.1 K/uL   Dg Chest 2 View  Result Date: 11/17/2017 CLINICAL DATA:  Cough and fever EXAM: CHEST  2 VIEW COMPARISON:  None FINDINGS: The heart size and mediastinal contours are within normal limits. Decreased lung volumes. The visualized skeletal structures are unremarkable. IMPRESSION: No active cardiopulmonary disease. Electronically Signed   By: Kerby Moors M.D.   On: 11/17/2017 21:23    Pending Labs Unresulted Labs (From admission, onward)   Start      Ordered   11/18/17 0500  CBC  Tomorrow morning,   R     11/18/17 0135  11/18/17 8950  Basic metabolic panel  Tomorrow morning,   R     11/18/17 0135   11/18/17 0130  Lactic acid, plasma  Add-on,   R     11/18/17 0129   11/17/17 2010  Urine culture  STAT,   STAT     11/17/17 2010   11/17/17 2009  Culture, blood (routine x 2)  BLOOD CULTURE X 2,   STAT     11/17/17 2010      Vitals/Pain Today's Vitals   11/17/17 2245 11/17/17 2255 11/18/17 0033 11/18/17 0124  BP:  (!) 155/63  139/65  Pulse: 65 65 66 68  Resp: (!) '8 13 13 13  ' Temp:    98.5 F (36.9 C)  TempSrc:    Oral  SpO2: 95% 93% 93% 95%    Isolation Precautions No active isolations  Medications Medications  oseltamivir (TAMIFLU) capsule 75 mg (75 mg Oral Given 11/18/17 0037)  aspirin tablet 81 mg (not administered)  furosemide (LASIX) tablet 20 mg (not administered)  meloxicam (MOBIC) tablet 15 mg (not administered)  methocarbamol (ROBAXIN) tablet 500 mg (not administered)  polyethylene glycol (MIRALAX / GLYCOLAX) packet 17 g (not administered)  sertraline (ZOLOFT) tablet 50 mg (not administered)  traMADol (ULTRAM) tablet 50 mg (not administered)  enoxaparin (LOVENOX) injection 40 mg (not administered)  acetaminophen (TYLENOL) tablet 650 mg (not administered)    Or  acetaminophen (TYLENOL) suppository 650 mg (not administered)  0.9 %  sodium chloride infusion (not administered)  ondansetron (ZOFRAN) tablet 4 mg (not administered)    Or  ondansetron (ZOFRAN) injection 4 mg (not administered)  albuterol (PROVENTIL) (2.5 MG/3ML) 0.083% nebulizer solution 2.5 mg (not administered)  sodium chloride 0.9 % bolus 500 mL (0 mLs Intravenous Stopped 11/18/17 0024)    Mobility walks with device

## 2017-11-18 NOTE — Progress Notes (Signed)
PROGRESS NOTE    Stephanie Jenkins  WUJ:[id number] DOB: [birth year] DOA: 11/17/2017 PCP: Richmond Campbell., PA-C    Brief Narrative:  84 year old female who presented with weakness. Patient does have a significant past medical history for type 2 diabetes mellitus, dyslipidemia and osteoporosis. For last 3 days prior to hospitalization she was noted to have nonproductive cough, urinary incontinence and generalized weakness that progressed to lethargy and somnolence for last 12 hours prior to admission. On the initial physical examination she was noted to be febrile 101.1, heart rate 65-90, respiratory rate 20, blood pressure 155/63, oxygen saturation 93%. Dry mucous membranes, lungs with rales at the mid and lower lung fields bilaterally, no wheezing, heart S1-S2 present and rhythmic, abdomen soft nontender, lower extremity no edema. Sodium 128, potassium 4.4, chloride 95, bicarbonate 24, glucose 126, BUN 13, creatinine 0.6, white count 4.4, hemoglobin 10.6, hematocrit 31.6, platelets 213. Influenza A+. Chest x-ray, hypoinflated, left rotation, no infiltrates. Urinalysis negative for infection.  Patient will be admitted to the hospital with the working diagnosis of sepsis due to influenza A infection.  Assessment & Plan:   Principal Problem:   Influenza A Active Problems:   Chronic diastolic CHF (congestive heart failure) (HCC)   Abnormal urinalysis   Diabetes mellitus type 2, controlled (HCC)   Hyponatremia   Normocytic normochromic anemia  1. Sepsis due to influenza A infection. Patient hemodynamically stable, no signs of pneumonia, afebrile since admission, will continue antiviral therapy with Oseltamivir. Gentle hydration with IV fluids, hold on furosemide. Will plan for discharge in am, if continue afebrile. Droplet precautions.   2. Hyponatremia. Hypovolemic hyponatremia, will continue hydration with normal saline, will follow on renal panel in am. Renal function is preserved with serum cr  at 0.75.   3. Type 2 diabetes mellitus. Diet controlled, fasting glucose 110. Will add insulin sliding scale for glucose cover and monitoring.   4. Chronic diastolic heart failure. Patient hypovolemic, will continue to hold on furosemide and continue gentle hydration with isotonic saline.   5. Chronic normocytic anemia. Hb and hct stable with no signs of bleeding.   6. Acute on chronic back pain. Will add low dose ibuprofen scheduled, as needed tramadol for break through pain, Out of bed as tolerated and physical therapy evaluation. Continue methocarbamol.   7. Depression. Continue sertraline.    DVT prophylaxis: enoxaparin  Code Status: full Family Communication: No family at the bedside Disposition Plan: home   Consultants:     Procedures:     Antimicrobials:   Oseltamivir.     Subjective: Positive back pain, moderate to severe, worse with movement, sharp in nature, with cephalic radiation, no improvement factors, no associated nausea or vomiting. Patient has been afebrile.   Objective: Vitals:   11/18/17 0124 11/18/17 0342 11/18/17 0656 11/18/17 1000  BP: 139/65 (!) 142/60 (!) 145/90   Pulse: 68 73 88   Resp: 13 12 20    Temp: 98.5 F (36.9 C) (!) 97.5 F (36.4 C) 98.9 F (37.2 C)   TempSrc: Oral Oral    SpO2: 95% 97% 95%   Weight:    70.3 kg (155 lb)  Height:    5' (1.524 m)    Intake/Output Summary (Last 24 hours) at 11/18/2017 1237 Last data filed at 11/18/2017 0943 Gross per 24 hour  Intake 826 ml  Output 300 ml  Net 526 ml   Filed Weights   11/18/17 1000  Weight: 70.3 kg (155 lb)    Examination:   General: Not  in pain or dyspnea, deconditioned and in pain.  Neurology: Awake and alert, non focal  E ENT: mild pallor, no icterus, oral mucosa moist Cardiovascular: No JVD. S1-S2 present, rhythmic, no gallops, rubs, or murmurs. No lower extremity edema. Pulmonary: vesicular breath sounds bilaterally, adequate air movement, no wheezing, rhonchi or  rales. Gastrointestinal. Abdomen flat, no organomegaly, non tender, no rebound or guarding Skin. No rashes Musculoskeletal: no joint deformities, pain to palpation at the lower lumbar and sacrum.      Data Reviewed: I have personally reviewed following labs and imaging studies  CBC: Recent Labs  Lab 11/17/17 2050 11/18/17 0604  WBC 4.4 5.4  NEUTROABS 2.6  --   HGB 10.6* 11.2*  HCT 31.6* 33.0*  MCV 95.8 95.7  PLT 213 209   Basic Metabolic Panel: Recent Labs  Lab 11/17/17 2050 11/18/17 0604  NA 128* 128*  K 4.4 3.8  CL 95* 94*  CO2 24 24  GLUCOSE 126* 110*  BUN 13 13  CREATININE 0.86 0.75  CALCIUM 8.1* 8.4*   GFR: Estimated Creatinine Clearance: 39.2 mL/min (by C-G formula based on SCr of 0.75 mg/dL). Liver Function Tests: Recent Labs  Lab 11/17/17 2050  AST 39  ALT 16  ALKPHOS 63  BILITOT 0.7  PROT 6.6  ALBUMIN 3.8   No results for input(s): LIPASE, AMYLASE in the last 168 hours. No results for input(s): AMMONIA in the last 168 hours. Coagulation Profile: No results for input(s): INR, PROTIME in the last 168 hours. Cardiac Enzymes: No results for input(s): CKTOTAL, CKMB, CKMBINDEX, TROPONINI in the last 168 hours. BNP (last 3 results) No results for input(s): PROBNP in the last 8760 hours. HbA1C: No results for input(s): HGBA1C in the last 72 hours. CBG: No results for input(s): GLUCAP in the last 168 hours. Lipid Profile: No results for input(s): CHOL, HDL, LDLCALC, TRIG, CHOLHDL, LDLDIRECT in the last 72 hours. Thyroid Function Tests: No results for input(s): TSH, T4TOTAL, FREET4, T3FREE, THYROIDAB in the last 72 hours. Anemia Panel: No results for input(s): VITAMINB12, FOLATE, FERRITIN, TIBC, IRON, RETICCTPCT in the last 72 hours.    Radiology Studies: I have reviewed all of the imaging during this hospital visit personally     Scheduled Meds: . aspirin EC  81 mg Oral Daily  . enoxaparin (LOVENOX) injection  40 mg Subcutaneous Q24H  .  furosemide  20 mg Oral Daily  . guaiFENesin  600 mg Oral BID  . meloxicam  15 mg Oral Daily  . oseltamivir  30 mg Oral BID  . polyethylene glycol  17 g Oral BID  . sertraline  50 mg Oral Daily   Continuous Infusions:   LOS: 0 days        Rabiah Goeser Annett Gulaaniel Mead Slane, MD [facility] Pager [phone]

## 2017-11-18 NOTE — ED Notes (Signed)
Attempted to call report. Charge nurse stated nurse would call back for report when ready.

## 2017-11-18 NOTE — Progress Notes (Signed)
Pt received to this unit. Pts assessment is unchanged from previous assessment. Will continue to monitor

## 2017-11-19 LAB — CBC WITH DIFFERENTIAL/PLATELET
BASOS PCT: 0 %
Basophils Absolute: 0 10*3/uL (ref 0.0–0.1)
EOS ABS: 0 10*3/uL (ref 0.0–0.7)
Eosinophils Relative: 0 %
HEMATOCRIT: 35.9 % — AB (ref 36.0–46.0)
Hemoglobin: 12.3 g/dL (ref 12.0–15.0)
Lymphocytes Relative: 23 %
Lymphs Abs: 1.2 10*3/uL (ref 0.7–4.0)
MCH: 32.2 pg (ref 26.0–34.0)
MCHC: 34.3 g/dL (ref 30.0–36.0)
MCV: 94 fL (ref 78.0–100.0)
MONOS PCT: 19 %
Monocytes Absolute: 1 10*3/uL (ref 0.1–1.0)
NEUTROS ABS: 3.1 10*3/uL (ref 1.7–7.7)
Neutrophils Relative %: 58 %
Platelets: 197 10*3/uL (ref 150–400)
RBC: 3.82 MIL/uL — AB (ref 3.87–5.11)
RDW: 14.5 % (ref 11.5–15.5)
WBC: 5.3 10*3/uL (ref 4.0–10.5)

## 2017-11-19 LAB — BASIC METABOLIC PANEL
ANION GAP: 10 (ref 5–15)
BUN: 10 mg/dL (ref 6–20)
CHLORIDE: 94 mmol/L — AB (ref 101–111)
CO2: 24 mmol/L (ref 22–32)
Calcium: 8.5 mg/dL — ABNORMAL LOW (ref 8.9–10.3)
Creatinine, Ser: 0.69 mg/dL (ref 0.44–1.00)
GFR calc non Af Amer: >60 mL/min (ref >60)
Glucose, Bld: 107 mg/dL — ABNORMAL HIGH (ref 65–99)
POTASSIUM: 3 mmol/L — AB (ref 3.5–5.1)
SODIUM: 128 mmol/L — AB (ref 135–145)

## 2017-11-19 LAB — GLUCOSE, CAPILLARY
GLUCOSE-CAPILLARY: 104 mg/dL — AB (ref 65–99)
GLUCOSE-CAPILLARY: 128 mg/dL — AB (ref 65–99)
GLUCOSE-CAPILLARY: 115 mg/dL — AB (ref 65–99)
GLUCOSE-CAPILLARY: 129 mg/dL — AB (ref 65–99)

## 2017-11-19 LAB — MAGNESIUM: MAGNESIUM: 1.9 mg/dL (ref 1.7–2.4)

## 2017-11-19 MED ORDER — POTASSIUM CHLORIDE CRYS ER 20 MEQ PO TBCR
40.0000 meq | EXTENDED_RELEASE_TABLET | Freq: Once | ORAL | Status: AC
  Administered 2017-11-19: 40 meq via ORAL
  Filled 2017-11-19: qty 2

## 2017-11-19 NOTE — Care Management Note (Signed)
Case Management Note  Patient Details  Name: Stephanie Jenkins MRN: [id number] Date of Birth: [birth year]  Sharma CovertSubjective/Objective:                  influenza A+ and emesis  Action/Plan: Date:  November 19, 2017 Chart reviewed for concurrent status and case management needs.  Will continue to follow patient progress.  Discharge Planning: following for needs.  None present at this time of review. Expected discharge date: November 22 2017 Marcelle SmilingRhonda Riyad Keena, BSN, RetreatRN3, [place]M   [phone]   Expected Discharge Date:  11/20/17               Expected Discharge Plan:  Home/Self Care  In-House Referral:     Discharge planning Services  CM Consult  Post Acute Care Choice:    Choice offered to:     DME Arranged:    DME Agency:     HH Arranged:    HH Agency:     Status of Service:  In process, will continue to follow  If discussed at Long Length of Stay Meetings, dates discussed:    Additional Comments:  Golda AcreDavis, Graison Leinberger Lynn, RN 11/19/2017, 9:20 AM

## 2017-11-19 NOTE — Progress Notes (Addendum)
PROGRESS NOTE  Stephanie Jenkins  ZOX:[id number] DOB: [birth year] DOA: 11/17/2017   PCP: Richmond Campbell., PA-C   Brief Narrative:  Patient is 84 year old female with diabetes, hyperlipidemia and osteoporosis, presented with 2 days duration of upper-outer cough, urinary incontinence generalized weakness, lethargy and somnolence. Initial diagnostic studies positive for influenza A. Patient admitted for further evaluation and management.  Assessment & Plan:   Principal Problem:   Sepsis due to Influenza A - Continues to improve but still weak, continued cough and chest pain with coughing spells - Continue Oseltamivir - Holding Lasix and continuing gentle hydration with fluids - Keep on droplet precaution - Possible discharge in morning  Active Problems:   Chronic diastolic CHF (congestive heart failure) (HCC) - no signs of volume overload, we'll likely be able to resume Lasix on discharge    Hypokalemia  - will supplement - BMP in AM      Abnormal urinalysis   Diabetes mellitus type 2, controlled (HCC) - diet controlled     Hyponatremia - Na is still low - pt on IVF - encouraged oral intake - BMP in AM    Normocytic normochromic anemia - Hg overall stable with no evidence of active bleeding     Acute on chronic back pain - provide analgesia as needed     Depression - continue Sertraline   DVT prophylaxis: enoxaparin Code Status: DNR Family Communication: pt at bedside  Disposition Plan: home in AM if electrolytes stable   Consultants:   None  Procedures:   None  Antimicrobials:   Oseltamivir.    Subjective: Pt reports ongoing cough and fatigue.   Objective: Vitals:   11/18/17 1000 11/18/17 1506 11/18/17 2237 11/19/17 0432  BP:  139/74 (!) 153/79 (!) 158/70  Pulse:  80 78 66  Resp:  18 20 16   Temp:  (!) 102 F (38.9 C) (!) 97.4 F (36.3 C) 98.3 F (36.8 C)  TempSrc:  Oral  Oral  SpO2:  98% 99% 100%  Weight: 70.3 kg (155 lb)     Height: 5'  (1.524 m)       Intake/Output Summary (Last 24 hours) at 11/19/2017 1350 Last data filed at 11/19/2017 0600 Gross per 24 hour  Intake 1219.17 ml  Output -  Net 1219.17 ml   Filed Weights   11/18/17 1000  Weight: 70.3 kg (155 lb)    Physical Exam  Constitutional: Appears deconditioned CVS: RRR, S1/S2 +, no gallops, no carotid bruit.  Pulmonary: diminished breath sounds at bases  Abdominal: Soft. BS +,  no distension, tenderness, rebound or guarding.  Musculoskeletal: Normal range of motion. No edema and no tenderness.   Data Reviewed: I have personally reviewed following labs and imaging studies  CBC: Recent Labs  Lab 11/17/17 2050 11/18/17 0604 11/19/17 0556  WBC 4.4 5.4 5.3  NEUTROABS 2.6  --  3.1  HGB 10.6* 11.2* 12.3  HCT 31.6* 33.0* 35.9*  MCV 95.8 95.7 94.0  PLT 213 209 197   Basic Metabolic Panel: Recent Labs  Lab 11/17/17 2050 11/18/17 0604 11/19/17 0556  NA 128* 128* 128*  K 4.4 3.8 3.0*  CL 95* 94* 94*  CO2 24 24 24   GLUCOSE 126* 110* 107*  BUN 13 13 10   CREATININE 0.86 0.75 0.69  CALCIUM 8.1* 8.4* 8.5*   Liver Function Tests: Recent Labs  Lab 11/17/17 2050  AST 39  ALT 16  ALKPHOS 63  BILITOT 0.7  PROT 6.6  ALBUMIN 3.8   CBG: Recent Labs  Lab 11/18/17 1650 11/18/17 2241 11/19/17 0745 11/19/17 1125  GLUCAP 129* 121* 104* 128*   Radiology Studies: I have personally reviewed imaging studies and the blood work from this hospital stay.  Scheduled Meds: . aspirin EC  81 mg Oral Daily  . enoxaparin (LOVENOX) injection  40 mg Subcutaneous Q24H  . guaiFENesin  600 mg Oral BID  . ibuprofen  200 mg Oral TID  . insulin aspart  0-9 Units Subcutaneous TID WC  . oseltamivir  30 mg Oral BID  . polyethylene glycol  17 g Oral BID  . sertraline  50 mg Oral Daily  . traMADol  50 mg Oral TID   Continuous Infusions: . sodium chloride 50 mL/hr at 11/18/17 1513    LOS: 1 day   Debbora PrestoIskra Magick-Alyss Granato, MD [facility] Pager  [phone]  Total 35 minutes spent.

## 2017-11-19 NOTE — Evaluation (Signed)
Physical Therapy Evaluation Patient Details Name: Stephanie Jenkins MRN: [id number] DOB: [birth year] Today's Date: 11/19/2017   History of Present Illness  Stephanie Jenkins is a 84 y.o. female with medical history significant of DM type II, HLD, osteoporosis; who presented with complaints of generalized weakness.  Reports associated symptoms of a mostly nonproductive cough for the last 3 days, urinary incontinence, generalized weakness, mild confusion, and feeling warm to the touch.  Tested positive for influenza A.  Clinical Impression  Patient presents with decreased strength, decreased activity tolerance and decreased balance all limiting independence/safety with mobility.  She will benefit from skilled PT in the acute setting to allow return home with 24 hour assist at d/c.  Likely not to need PT at d/c.     Follow Up Recommendations No PT follow up    Equipment Recommendations  None recommended by PT    Recommendations for Other Services       Precautions / Restrictions Precautions Precautions: Fall Precaution Comments: droplet Restrictions Weight Bearing Restrictions: No      Mobility  Bed Mobility Overal bed mobility: Needs Assistance Bed Mobility: Supine to Sit     Supine to sit: Supervision;HOB elevated     General bed mobility comments: min A for scooting to EOB  Transfers Overall transfer level: Needs assistance Equipment used: Rolling walker (2 wheeled) Transfers: Sit to/from Stand Sit to Stand: Min assist         General transfer comment: for balance. support  Ambulation/Gait Ambulation/Gait assistance: Min assist Ambulation Distance (Feet): 100 Feet Assistive device: Rolling walker (2 wheeled) Gait Pattern/deviations: Step-through pattern;Step-to pattern;Decreased stride length;Trunk flexed     General Gait Details: slow steady pace with flexed posture throughout, assist for balance/due to weakness  Stairs            Wheelchair Mobility     Modified Rankin (Stroke Patients Only)       Balance Overall balance assessment: Needs assistance Sitting-balance support: No upper extremity supported Sitting balance-Leahy Scale: Fair     Standing balance support: Bilateral upper extremity supported Standing balance-Leahy Scale: Poor Standing balance comment: UE support for balance                             Pertinent Vitals/Pain Pain Assessment: Faces Faces Pain Scale: Hurts little more Pain Location: generalized aches Pain Intervention(s): Monitored during session;Repositioned    Home Living Family/patient expects to be discharged to:: Private residence Living Arrangements: Non-relatives/Friends(24 hour caregivers in her home, daughter lives behind her) Available Help at Discharge: Available 24 hours/day;Family;Personal care attendant Type of Home: House Home Access: Ramped entrance     Home Layout: One level Home Equipment: Environmental consultant - 2 wheels;Wheelchair - manual;Bedside commode;Walker - 4 wheels;Hospital bed Additional Comments: lift chair    Prior Function Level of Independence: Needs assistance   Gait / Transfers Assistance Needed: walks with walker unaided; goes out every day to walk somewhere  ADL's / Homemaking Assistance Needed: assist for shower once a week        Hand Dominance        Extremity/Trunk Assessment   Upper Extremity Assessment Upper Extremity Assessment: Generalized weakness    Lower Extremity Assessment Lower Extremity Assessment: Generalized weakness    Cervical / Trunk Assessment Cervical / Trunk Assessment: Kyphotic  Communication   Communication: No difficulties  Cognition Arousal/Alertness: Awake/alert Behavior During Therapy: WFL for tasks assessed/performed Overall Cognitive Status: Within Functional Limits for tasks assessed  General Comments      Exercises     Assessment/Plan    PT  Assessment Patient needs continued PT services  PT Problem List Decreased strength;Decreased mobility;Decreased balance;Decreased knowledge of use of DME;Decreased activity tolerance;Decreased safety awareness       PT Treatment Interventions DME instruction;Functional mobility training;Balance training;Patient/family education;Gait training;Therapeutic activities;Therapeutic exercise    PT Goals (Current goals can be found in the Care Plan section)  Acute Rehab PT Goals Patient Stated Goal: To return home PT Goal Formulation: With patient/family Time For Goal Achievement: 11/26/17 Potential to Achieve Goals: Good    Frequency Min 3X/week   Barriers to discharge        Co-evaluation               AM-PAC PT "6 Clicks" Daily Activity  Outcome Measure Difficulty turning over in bed (including adjusting bedclothes, sheets and blankets)?: A Little Difficulty moving from lying on back to sitting on the side of the bed? : A Little Difficulty sitting down on and standing up from a chair with arms (e.g., wheelchair, bedside commode, etc,.)?: Unable Help needed moving to and from a bed to chair (including a wheelchair)?: A Little Help needed walking in hospital room?: A Little Help needed climbing 3-5 steps with a railing? : A Lot 6 Click Score: 15    End of Session Equipment Utilized During Treatment: Gait belt Activity Tolerance: Patient limited by fatigue Patient left: in chair;with family/visitor present;with call bell/phone within reach   PT Visit Diagnosis: Muscle weakness (generalized) (M62.81);Difficulty in walking, not elsewhere classified (R26.2)    Time: [id number] PT Time Calculation (min) (ACUTE ONLY): 27 min   Charges:   PT Evaluation $PT Eval Moderate Complexity: 1 Mod PT Treatments $Gait Training: 8-22 mins   PT G CodesSheran Lawless:        Cyndi Tannar Broker, [place]PT [phone] 11/19/2017   Elray Mcgregorynthia Kalynn Declercq 11/19/2017, 11:29 AM

## 2017-11-20 LAB — BASIC METABOLIC PANEL
ANION GAP: 9 (ref 5–15)
BUN: 10 mg/dL (ref 6–20)
CHLORIDE: 94 mmol/L — AB (ref 101–111)
CO2: 24 mmol/L (ref 22–32)
Calcium: 8.3 mg/dL — ABNORMAL LOW (ref 8.9–10.3)
Creatinine, Ser: 0.7 mg/dL (ref 0.44–1.00)
GFR calc Af Amer: >60 mL/min (ref >60)
GFR calc non Af Amer: >60 mL/min (ref >60)
Glucose, Bld: 99 mg/dL (ref 65–99)
POTASSIUM: 3.3 mmol/L — AB (ref 3.5–5.1)
SODIUM: 127 mmol/L — AB (ref 135–145)

## 2017-11-20 LAB — CBC
HCT: 34.5 % — ABNORMAL LOW (ref 36.0–46.0)
HEMOGLOBIN: 11.9 g/dL — AB (ref 12.0–15.0)
MCH: 32.1 pg (ref 26.0–34.0)
MCHC: 34.5 g/dL (ref 30.0–36.0)
MCV: 93 fL (ref 78.0–100.0)
Platelets: 176 10*3/uL (ref 150–400)
RBC: 3.71 MIL/uL — AB (ref 3.87–5.11)
RDW: 14.3 % (ref 11.5–15.5)
WBC: 4.1 10*3/uL (ref 4.0–10.5)

## 2017-11-20 LAB — GLUCOSE, CAPILLARY
GLUCOSE-CAPILLARY: 104 mg/dL — AB (ref 65–99)
GLUCOSE-CAPILLARY: 100 mg/dL — AB (ref 65–99)
GLUCOSE-CAPILLARY: 126 mg/dL — AB (ref 65–99)
Glucose-Capillary: 131 mg/dL — ABNORMAL HIGH (ref 65–99)

## 2017-11-20 MED ORDER — FUROSEMIDE 20 MG PO TABS
20.0000 mg | ORAL_TABLET | Freq: Every day | ORAL | Status: DC
  Administered 2017-11-20 – 2017-11-21 (×2): 20 mg via ORAL
  Filled 2017-11-20 (×2): qty 1

## 2017-11-20 MED ORDER — SODIUM BICARBONATE 650 MG PO TABS
650.0000 mg | ORAL_TABLET | Freq: Two times a day (BID) | ORAL | Status: DC
  Administered 2017-11-20 – 2017-11-21 (×4): 650 mg via ORAL
  Filled 2017-11-20 (×4): qty 1

## 2017-11-20 MED ORDER — POTASSIUM CHLORIDE CRYS ER 20 MEQ PO TBCR
40.0000 meq | EXTENDED_RELEASE_TABLET | Freq: Once | ORAL | Status: AC
  Administered 2017-11-20: 40 meq via ORAL
  Filled 2017-11-20: qty 2

## 2017-11-20 NOTE — Progress Notes (Signed)
Physical Therapy Treatment Patient Details Name: Stephanie Jenkins MRN: [id number] DOB: 03-25-1923 Today's Date: 11/20/2017    History of Present Illness Stephanie Jenkins is a 84 y.o. female with medical history significant of DM type II, HLD, osteoporosis; who presented with complaints of generalized weakness.  Reports associated symptoms of a mostly nonproductive cough for the last 3 days, urinary incontinence, generalized weakness, mild confusion, and feeling warm to the touch.  Tested positive for influenza A.    PT Comments    Pt was sleeping upon arrival, on waking she reported not feeling too well, but chose to participate in PT. Pt ambulated with RW in hall and to use the bathroom before returning to bed. Patient's caregiver was present and plans to help her at home.  Follow Up Recommendations  No PT follow up     Equipment Recommendations  None recommended by PT    Recommendations for Other Services       Precautions / Restrictions Precautions Precautions: Fall Precaution Comments: droplet Restrictions Weight Bearing Restrictions: No    Mobility  Bed Mobility Overal bed mobility: Needs Assistance Bed Mobility: Supine to Sit;Sit to Supine     Supine to sit: Supervision;HOB elevated Sit to supine: Min assist   General bed mobility comments: cues for scooting to EOB, caregiver assisted LEs onto bed  Transfers Overall transfer level: Needs assistance Equipment used: Rolling walker (2 wheeled) Transfers: Sit to/from Stand Sit to Stand: Min assist         General transfer comment: assist for balance, lift off  Ambulation/Gait Ambulation/Gait assistance: Min guard Ambulation Distance (Feet): 170 Feet Assistive device: Rolling walker (2 wheeled) Gait Pattern/deviations: Step-through pattern;Decreased stride length;Trunk flexed     General Gait Details: slow but steady pace with flexed posture, min/guard for safety, distance to tolerance   Stairs             Wheelchair Mobility    Modified Rankin (Stroke Patients Only)       Balance Overall balance assessment: Needs assistance Sitting-balance support: No upper extremity supported Sitting balance-Leahy Scale: Fair     Standing balance support: Bilateral upper extremity supported Standing balance-Leahy Scale: Poor Standing balance comment: UE support for balance                            Cognition Arousal/Alertness: Awake/alert Behavior During Therapy: WFL for tasks assessed/performed Overall Cognitive Status: Within Functional Limits for tasks assessed                                 General Comments: Pt has been napping most of day, still a little sleepy      Exercises      General Comments        Pertinent Vitals/Pain Pain Assessment: Faces Faces Pain Scale: Hurts little more Pain Location: general aches all over Pain Descriptors / Indicators: Aching Pain Intervention(s): Limited activity within patient's tolerance;Repositioned;Monitored during session    Home Living                      Prior Function            PT Goals (current goals can now be found in the care plan section) Progress towards PT goals: Progressing toward goals    Frequency    Min 3X/week      PT Plan Current plan remains  appropriate    Co-evaluation              AM-PAC PT "6 Clicks" Daily Activity  Outcome Measure  Difficulty turning over in bed (including adjusting bedclothes, sheets and blankets)?: A Little Difficulty moving from lying on back to sitting on the side of the bed? : A Little Difficulty sitting down on and standing up from a chair with arms (e.g., wheelchair, bedside commode, etc,.)?: Unable Help needed moving to and from a bed to chair (including a wheelchair)?: A Little Help needed walking in hospital room?: A Little Help needed climbing 3-5 steps with a railing? : A Lot 6 Click Score: 15    End of Session    Activity Tolerance: Patient tolerated treatment well Patient left: with call bell/phone within reach;in bed;with bed alarm set;with family/visitor present Nurse Communication: Mobility status PT Visit Diagnosis: Muscle weakness (generalized) (M62.81);Difficulty in walking, not elsewhere classified (R26.2)     Time: [id number] PT Time Calculation (min) (ACUTE ONLY): 17 min  Charges:  $Gait Training: 8-22 mins                    G Codes:      [place] Brysun Eschmann, SPT   [place] Gwyneth Fernandez 11/20/2017, 2:55 PM

## 2017-11-20 NOTE — Progress Notes (Addendum)
PROGRESS NOTE  Stephanie Jenkins  [id number] DOB: 05-Oct-1923 DOA: 11/17/2017   PCP: Richmond CampbellKaplan, Kristen W., PA-C   Brief Narrative:  Patient is 84 year old female with diabetes, hyperlipidemia and osteoporosis, presented with 2 days duration of upper-outer cough, urinary incontinence generalized weakness, lethargy and somnolence. Initial diagnostic studies positive for influenza A. Patient admitted for further evaluation and management.  Assessment & Plan:   Principal Problem:   Sepsis due to Influenza A - overall slightly better but still weal - continue Oseltamivir - continue conservative care  - encouraged ambulation and oral intake   Active Problems:   Metabolic encephalopathy - secondary to sepsis in the setting of influenza A and hyponatremia - improving overall     Chronic diastolic CHF (congestive heart failure) (HCC) - stop fluids and resume home lasix     Hypokalemia  - cont to supplement       Abnormal urinalysis   Diabetes mellitus type 2, controlled (HCC) - diet controlled     Hyponatremia - Na is still low - stop IVF - place on sodium tablets  - BMP in AM    Normocytic normochromic anemia - Hg overall stable - no evidence of bleeding     Acute on chronic back pain - cont analgesia as needed    Depression - continue Sertraline   DVT prophylaxis: enoxaparin Code Status: DNR Family Communication: pt and family at bedside  Disposition Plan: home in 1-2 days   Consultants:   None  Procedures:   None  Antimicrobials:   Oseltamivir.    Subjective: Pt reports feeling tired and weak, overall better.  Objective: Vitals:   11/19/17 2224 11/20/17 0555 11/20/17 0859 11/20/17 1359  BP: 123/60 137/61  119/60  Pulse: 72 64  66  Resp: 18 20 20 18   Temp: 99 F (37.2 C) 98.3 F (36.8 C)  98.5 F (36.9 C)  TempSrc: Oral Oral  Oral  SpO2: 99% 99%  98%  Weight:      Height:        Intake/Output Summary (Last 24 hours) at 11/20/2017 1535 Last  data filed at 11/20/2017 1200 Gross per 24 hour  Intake 984.17 ml  Output -  Net 984.17 ml   Filed Weights   11/18/17 1000  Weight: 70.3 kg (155 lb)    Physical Exam  Constitutional: Appears calm, NAD CVS: RRR, S1/S2 +, no murmurs, no gallops, no carotid bruit.  Pulmonary: Effort and breath sounds normal, scattered rhonchi mostly in bases  Abdominal: Soft. BS +,  no distension, tenderness, rebound or guarding.  Musculoskeletal: Normal range of motion. No edema and no tenderness.  Psychiatric: Normal mood and affect.   Data Reviewed: I have personally reviewed following labs and imaging studies  CBC: Recent Labs  Lab 11/17/17 2050 11/18/17 0604 11/19/17 0556 11/20/17 0625  WBC 4.4 5.4 5.3 4.1  NEUTROABS 2.6  --  3.1  --   HGB 10.6* 11.2* 12.3 11.9*  HCT 31.6* 33.0* 35.9* 34.5*  MCV 95.8 95.7 94.0 93.0  PLT 213 209 197 176   Basic Metabolic Panel: Recent Labs  Lab 11/17/17 2050 11/18/17 0604 11/19/17 0556 11/20/17 0625  NA 128* 128* 128* 127*  K 4.4 3.8 3.0* 3.3*  CL 95* 94* 94* 94*  CO2 24 24 24 24   GLUCOSE 126* 110* 107* 99  BUN 13 13 10 10   CREATININE 0.86 0.75 0.69 0.70  CALCIUM 8.1* 8.4* 8.5* 8.3*  MG  --   --  1.9  --  Liver Function Tests: Recent Labs  Lab 11/17/17 2050  AST 39  ALT 16  ALKPHOS 63  BILITOT 0.7  PROT 6.6  ALBUMIN 3.8   CBG: Recent Labs  Lab 11/19/17 1125 11/19/17 1656 11/19/17 2223 11/20/17 0736 11/20/17 1148  GLUCAP 128* 115* 129* 104* 131*   Radiology Studies: I have personally reviewed imaging studies and the blood work from this hospital stay.  Scheduled Meds: . aspirin EC  81 mg Oral Daily  . enoxaparin (LOVENOX) injection  40 mg Subcutaneous Q24H  . guaiFENesin  600 mg Oral BID  . ibuprofen  200 mg Oral TID  . insulin aspart  0-9 Units Subcutaneous TID WC  . oseltamivir  30 mg Oral BID  . polyethylene glycol  17 g Oral BID  . sertraline  50 mg Oral Daily  . traMADol  50 mg Oral TID   Continuous  Infusions: . sodium chloride 50 mL/hr at 11/19/17 2000    LOS: 2 days   Debbora Presto, MD [facility] Pager [phone]  Total 25 minutes spent.

## 2017-11-21 LAB — URINE CULTURE

## 2017-11-21 LAB — MAGNESIUM: MAGNESIUM: 1.9 mg/dL (ref 1.7–2.4)

## 2017-11-21 LAB — BASIC METABOLIC PANEL
Anion gap: 7 (ref 5–15)
BUN: 10 mg/dL (ref 6–20)
CHLORIDE: 97 mmol/L — AB (ref 101–111)
CO2: 27 mmol/L (ref 22–32)
Calcium: 8.5 mg/dL — ABNORMAL LOW (ref 8.9–10.3)
Creatinine, Ser: 0.79 mg/dL (ref 0.44–1.00)
GFR calc Af Amer: >60 mL/min (ref >60)
GFR calc non Af Amer: >60 mL/min (ref >60)
GLUCOSE: 103 mg/dL — AB (ref 65–99)
POTASSIUM: 3.5 mmol/L (ref 3.5–5.1)
Sodium: 131 mmol/L — ABNORMAL LOW (ref 135–145)

## 2017-11-21 LAB — GLUCOSE, CAPILLARY
GLUCOSE-CAPILLARY: 88 mg/dL (ref 65–99)
GLUCOSE-CAPILLARY: 108 mg/dL — AB (ref 65–99)
Glucose-Capillary: 106 mg/dL — ABNORMAL HIGH (ref 65–99)
Glucose-Capillary: 126 mg/dL — ABNORMAL HIGH (ref 65–99)

## 2017-11-21 LAB — CBC
HCT: 32.1 % — ABNORMAL LOW (ref 36.0–46.0)
HEMOGLOBIN: 10.9 g/dL — AB (ref 12.0–15.0)
MCH: 32 pg (ref 26.0–34.0)
MCHC: 34 g/dL (ref 30.0–36.0)
MCV: 94.1 fL (ref 78.0–100.0)
Platelets: 183 10*3/uL (ref 150–400)
RBC: 3.41 MIL/uL — AB (ref 3.87–5.11)
RDW: 14.5 % (ref 11.5–15.5)
WBC: 4.6 10*3/uL (ref 4.0–10.5)

## 2017-11-21 MED ORDER — POTASSIUM CHLORIDE CRYS ER 20 MEQ PO TBCR
40.0000 meq | EXTENDED_RELEASE_TABLET | Freq: Once | ORAL | Status: AC
  Administered 2017-11-21: 40 meq via ORAL
  Filled 2017-11-21: qty 2

## 2017-11-21 NOTE — Progress Notes (Signed)
PROGRESS NOTE  Stephanie Jenkins  [id number] DOB: 04/12/1923 DOA: 11/17/2017   PCP: Richmond CampbellKaplan, Kristen W., PA-C   Brief Narrative:  Patient is 84 year old female with diabetes, hyperlipidemia and osteoporosis, presented with 2 days duration of upper-outer cough, urinary incontinence generalized weakness, lethargy and somnolence. Initial diagnostic studies positive for influenza A. Patient admitted for further evaluation and management.  Assessment & Plan:   Principal Problem:   Sepsis due to Influenza A - feels better today but still with intermittent nausea  - continue Oseltamivir - encouraged oral intake and ambulation  - possible discharge in AM   Active Problems:   Metabolic encephalopathy - secondary to sepsis in the setting of influenza A and hyponatremia - continues to improve - encouraged ambulation     Chronic diastolic CHF (congestive heart failure) (HCC) - resumed lasix and pt reports feeling better - will repeat BMP in AM    Hypokalemia  - supplemented but still on low end of normal - will give additional dose of K-dur today  - BMP in AM       Abnormal urinalysis   Diabetes mellitus type 2, controlled (HCC) - diet controlled     Hyponatremia - Na is still low - added sodium bicarb tabs and Na is improving - BMP in AM     Normocytic normochromic anemia - Hg overall stable - no evidence of bleeding     Acute on chronic back pain - continue analgesia as needed     Depression - continue Sertraline   DVT prophylaxis: enoxaparin Code Status: DNR Family Communication: pt and son at bedside  Disposition Plan: home in am   Consultants:   None  Procedures:   None  Antimicrobials:   Oseltamivir.    Subjective: Pt reports feeling better, trying to eat more today.   Objective: Vitals:   11/20/17 0859 11/20/17 1359 11/20/17 2019 11/21/17 0649  BP:  119/60 117/68 (!) 158/59  Pulse:  66 60 (!) 59  Resp: 20 18 20  (!) 22  Temp:  98.5 F (36.9 C)  98.3 F (36.8 C) 98.5 F (36.9 C)  TempSrc:  Oral Oral Oral  SpO2:  98% 99% 95%  Weight:      Height:        Intake/Output Summary (Last 24 hours) at 11/21/2017 1140 Last data filed at 11/21/2017 0900 Gross per 24 hour  Intake 960 ml  Output -  Net 960 ml   Filed Weights   11/18/17 1000  Weight: 70.3 kg (155 lb)    Physical Exam  Constitutional: Appears calm, NAD  CVS: RRR, S1/S2 +, no gallops, no carotid bruit.  Pulmonary: Effort and breath sounds normal, mild rhonchi at bases  Abdominal: Soft. BS +,  no distension, tenderness, rebound or guarding.  Musculoskeletal: Normal range of motion. No edema and no tenderness.  Neuro: Alert. Normal reflexes, muscle tone coordination. No cranial nerve deficit.  Data Reviewed: I have personally reviewed following labs and imaging studies  CBC: Recent Labs  Lab 11/17/17 2050 11/18/17 0604 11/19/17 0556 11/20/17 0625 11/21/17 0611  WBC 4.4 5.4 5.3 4.1 4.6  NEUTROABS 2.6  --  3.1  --   --   HGB 10.6* 11.2* 12.3 11.9* 10.9*  HCT 31.6* 33.0* 35.9* 34.5* 32.1*  MCV 95.8 95.7 94.0 93.0 94.1  PLT 213 209 197 176 183   Basic Metabolic Panel: Recent Labs  Lab 11/17/17 2050 11/18/17 0604 11/19/17 0556 11/20/17 0625 11/21/17 0611  NA 128* 128* 128* 127* 131*  K 4.4 3.8 3.0* 3.3* 3.5  CL 95* 94* 94* 94* 97*  CO2 24 24 24 24 27   GLUCOSE 126* 110* 107* 99 103*  BUN 13 13 10 10 10   CREATININE 0.86 0.75 0.69 0.70 0.79  CALCIUM 8.1* 8.4* 8.5* 8.3* 8.5*  MG  --   --  1.9  --  1.9   Liver Function Tests: Recent Labs  Lab 11/17/17 2050  AST 39  ALT 16  ALKPHOS 63  BILITOT 0.7  PROT 6.6  ALBUMIN 3.8   CBG: Recent Labs  Lab 11/20/17 1148 11/20/17 1621 11/20/17 2157 11/21/17 0724 11/21/17 1059  GLUCAP 131* 100* 126* 106* 126*   Radiology Studies: I have personally reviewed imaging studies and the blood work from this hospital stay.  Scheduled Meds: . aspirin EC  81 mg Oral Daily  . enoxaparin (LOVENOX) injection   40 mg Subcutaneous Q24H  . furosemide  20 mg Oral Daily  . guaiFENesin  600 mg Oral BID  . ibuprofen  200 mg Oral TID  . insulin aspart  0-9 Units Subcutaneous TID WC  . oseltamivir  30 mg Oral BID  . polyethylene glycol  17 g Oral BID  . sertraline  50 mg Oral Daily  . sodium bicarbonate  650 mg Oral BID  . traMADol  50 mg Oral TID   Continuous Infusions:   LOS: 3 days   Debbora Presto, MD [facility] Pager [phone]  Total 25 minutes spent.

## 2017-11-22 LAB — BASIC METABOLIC PANEL
ANION GAP: 7 (ref 5–15)
BUN: 10 mg/dL (ref 6–20)
CALCIUM: 8.5 mg/dL — AB (ref 8.9–10.3)
CHLORIDE: 95 mmol/L — AB (ref 101–111)
CO2: 28 mmol/L (ref 22–32)
Creatinine, Ser: 0.69 mg/dL (ref 0.44–1.00)
GFR calc non Af Amer: >60 mL/min (ref >60)
Glucose, Bld: 107 mg/dL — ABNORMAL HIGH (ref 65–99)
Potassium: 4.1 mmol/L (ref 3.5–5.1)
SODIUM: 130 mmol/L — AB (ref 135–145)

## 2017-11-22 LAB — CULTURE, BLOOD (ROUTINE X 2)
CULTURE: NO GROWTH
CULTURE: NO GROWTH
SPECIAL REQUESTS: ADEQUATE
Special Requests: ADEQUATE

## 2017-11-22 LAB — CBC
HEMATOCRIT: 32.4 % — AB (ref 36.0–46.0)
HEMOGLOBIN: 11 g/dL — AB (ref 12.0–15.0)
MCH: 32 pg (ref 26.0–34.0)
MCHC: 34 g/dL (ref 30.0–36.0)
MCV: 94.2 fL (ref 78.0–100.0)
Platelets: 176 10*3/uL (ref 150–400)
RBC: 3.44 MIL/uL — AB (ref 3.87–5.11)
RDW: 14.6 % (ref 11.5–15.5)
WBC: 5.9 10*3/uL (ref 4.0–10.5)

## 2017-11-22 LAB — GLUCOSE, CAPILLARY: Glucose-Capillary: 105 mg/dL — ABNORMAL HIGH (ref 65–99)

## 2017-11-22 MED ORDER — OSELTAMIVIR PHOSPHATE 30 MG PO CAPS: 30.0000 mg | ORAL_CAPSULE | Freq: Two times a day (BID) | ORAL | 0 refills | Status: AC

## 2017-11-22 NOTE — Discharge Instructions (Signed)
Oseltamivir capsules What is this medicine? OSELTAMIVIR (os el TAM i vir) is an antiviral medicine. It is used to prevent and to treat some kinds of influenza or the flu. It will not work for colds or other viral infections. This medicine may be used for other purposes; ask your health care provider or pharmacist if you have questions. COMMON BRAND NAME(S): Tamiflu What should I tell my health care provider before I take this medicine? They need to know if you have any of the following conditions: -heart disease -immune system problems -kidney disease -liver disease -lung disease -an unusual or allergic reaction to oseltamivir, other medicines, foods, dyes, or preservatives -pregnant or trying to get pregnant -breast-feeding How should I use this medicine? Take this medicine by mouth with a glass of water. Follow the directions on the prescription label. Start this medicine at the first sign of flu symptoms. You can take it with or without food. If it upsets your stomach, take it with food. Take your medicine at regular intervals. Do not take your medicine more often than directed. Take all of your medicine as directed even if you think you are better. Do not skip doses or stop your medicine early. Talk to your pediatrician regarding the use of this medicine in children. While this drug may be prescribed for children as young as 14 days for selected conditions, precautions do apply. Overdosage: If you think you have taken too much of this medicine contact a poison control center or emergency room at once. NOTE: This medicine is only for you. Do not share this medicine with others. What if I miss a dose? If you miss a dose, take it as soon as you remember. If it is almost time for your next dose (within 2 hours), take only that dose. Do not take double or extra doses. What may interact with this medicine? Interactions are not expected. This list may not describe all possible interactions. Give  your health care provider a list of all the medicines, herbs, non-prescription drugs, or dietary supplements you use. Also tell them if you smoke, drink alcohol, or use illegal drugs. Some items may interact with your medicine. What should I watch for while using this medicine? Visit your doctor or health care professional for regular check ups. Tell your doctor if your symptoms do not start to get better or if they get worse. If you have the flu, you may be at an increased risk of developing seizures, confusion, or abnormal behavior. This occurs early in the illness, and more frequently in children and teens. These events are not common, but may result in accidental injury to the patient. Families and caregivers of patients should watch for signs of unusual behavior and contact a doctor or health care professional right away if the patient shows signs of unusual behavior. This medicine is not a substitute for the flu shot. Talk to your doctor each year about an annual flu shot. What side effects may I notice from receiving this medicine? Side effects that you should report to your doctor or health care professional as soon as possible: -allergic reactions like skin rash, itching or hives, swelling of the face, lips, or tongue -anxiety, confusion, unusual behavior -breathing problems -hallucination, loss of contact with reality -redness, blistering, peeling or loosening of the skin, including inside the mouth -seizures Side effects that usually do not require medical attention (report to your doctor or health care professional if they continue or are bothersome): -diarrhea -headache -nausea,   vomiting -pain This list may not describe all possible side effects. Call your doctor for medical advice about side effects. You may report side effects to FDA at 1-800-FDA-1088. Where should I keep my medicine? Keep out of the reach of children. Store at room temperature between 15 and 30 degrees C (59 and  86 degrees F). Throw away any unused medicine after the expiration date. NOTE: This sheet is a summary. It may not cover all possible information. If you have questions about this medicine, talk to your doctor, pharmacist, or health care provider.  2018 Elsevier/Gold Standard (2015-04-26 10:50:39)  

## 2017-11-22 NOTE — Progress Notes (Signed)
Pt discharged to home. DC instructions given with daughter and female family member at bedside. Prescription x 1 given for tamiflu. No concerns voiced. Pt left unit in wheelchair pushed by nurse tech. Left in stable condition. Ver Jonathon ResidesaWilliams,rn.

## 2017-11-22 NOTE — Discharge Summary (Signed)
Physician Discharge Summary  Stephanie Jenkins WUJ:[id number] DOB: [birth year] DOA: 11/17/2017  PCP: Richmond CampbellKaplan, Stephanie W., PA-C  Admit date: 11/17/2017 Discharge date: 11/22/2017  Recommendations for Outpatient Follow-up:  Continue Tamiflu today and tomorrow 11/23/2017.  Discharge Diagnoses:  Principal Problem:   Influenza A Active Problems:   Chronic diastolic CHF (congestive heart failure) (HCC)   Abnormal urinalysis   Diabetes mellitus type 2, controlled (HCC)   Hyponatremia   Normocytic normochromic anemia    Discharge Condition: stable   Diet recommendation: as tolerated   History of present illness:    Hospital Course:  Patient is 84 year old female with diabetes, hyperlipidemia and osteoporosis, presented with 2 days duration of upper-outer cough, urinary incontinence generalized weakness, lethargy and somnolence. Initial diagnostic studies positive for influenza A. Patient admitted for further evaluation and management.  Assessment & Plan:   Principal Problem:   Sepsis due to Influenza A - feels better today but still with intermittent nausea  - Oseltamivir on discharge as prescribed   Active Problems:   Acute metabolic encephalopathy  - secondary to sepsis in the setting of influenza A and hyponatremia - improved since admission     Chronic diastolic CHF (congestive heart failure) (HCC) - Compensated     Hypokalemia  - Supplemented       Abnormal urinalysis   Diabetes mellitus type 2, controlled (HCC) - Diet controlled     Hyponatremia - Sodium stable, 130    Normocytic normochromic anemia - Hg overall stable - no evidence of bleeding     Acute on chronic back pain - Continue pain management efforts     Depression - continue Sertraline   DVT prophylaxis: enoxaparin Code Status: DNR    Consultants:   None  Procedures:   None  Antimicrobials:   Oseltamivir.       Signed:  Manson PasseyAlma Lynann Demetrius, MD  [facility] 11/22/2017,  8:34 AM  Pager #: [phone]  Time spent in minutes: more than 30 minutes   Discharge Exam: Vitals:   11/21/17 2112 11/22/17 0609  BP: (!) 120/55 (!) 151/61  Pulse: 67 64  Resp: 18 20  Temp: 99.7 F (37.6 C) 98.2 F (36.8 C)  SpO2: 98% 92%   Vitals:   11/21/17 0649 11/21/17 1453 11/21/17 2112 11/22/17 0609  BP: (!) 158/59 111/77 (!) 120/55 (!) 151/61  Pulse: (!) 59 93 67 64  Resp: (!) 22 20 18 20   Temp: 98.5 F (36.9 C) 98.1 F (36.7 C) 99.7 F (37.6 C) 98.2 F (36.8 C)  TempSrc: Oral  Oral Oral  SpO2: 95% 100% 98% 92%  Weight:      Height:        General: Pt is alert, follows commands appropriately, not in acute distress Cardiovascular: Regular rate and rhythm, S1/S2 + Respiratory: Clear to auscultation bilaterally, no wheezing, no crackles, no rhonchi Abdominal: Soft, non tender, non distended, bowel sounds +, no guarding Extremities: no cyanosis, pulses palpable bilaterally DP and PT Neuro: Grossly nonfocal  Discharge Instructions  Discharge Instructions    Call MD for:  difficulty breathing, headache or visual disturbances   Complete by:  As directed    Call MD for:  persistant nausea and vomiting   Complete by:  As directed    Call MD for:  redness, tenderness, or signs of infection (pain, swelling, redness, odor or green/yellow discharge around incision site)   Complete by:  As directed    Call MD for:  severe uncontrolled pain   Complete by:  As directed    Diet - low sodium heart healthy   Complete by:  As directed    Discharge instructions   Complete by:  As directed    Continue Tamiflu today and tomorrow 11/23/2017.   Increase activity slowly   Complete by:  As directed      Allergies as of 11/22/2017      Reactions   Hydrocodone-acetaminophen    Other reaction(s): Other (See Comments) Talking out of her head.   Oxycodone    Other reaction(s): Other (See Comments) Talks out of her head      Medication List    STOP taking these  medications   fentaNYL 25 MCG/HR patch Commonly known as:  DURAGESIC - dosed mcg/hr     TAKE these medications   aspirin 81 MG tablet Take 81 mg by mouth daily.   cholecalciferol 400 units Tabs tablet Commonly known as:  VITAMIN D Take 800 Units by mouth daily.   furosemide 20 MG tablet Commonly known as:  LASIX Take 1 tablet (20 mg total) by mouth daily.   GLUCOSAMINE CHONDR 1500 COMPLX PO Take 1 tablet by mouth 2 (two) times daily.   meloxicam 15 MG tablet Commonly known as:  MOBIC TAKE 1 TABLET(15 MG) BY MOUTH DAILY   methocarbamol 500 MG tablet Commonly known as:  ROBAXIN Take 1 tablet (500 mg total) by mouth every 8 (eight) hours as needed for muscle spasms. What changed:  when to take this   multivitamin tablet Take 1 tablet by mouth daily.   polyethylene glycol packet Commonly known as:  MIRALAX / GLYCOLAX Take 17 g by mouth 2 (two) times daily.   sertraline 50 MG tablet Commonly known as:  ZOLOFT Take 50 mg by mouth daily.   traMADol 50 MG tablet Commonly known as:  ULTRAM Take 1 tablet (50 mg total) by mouth every 6 (six) hours as needed.     ASK your doctor about these medications   oseltamivir 30 MG capsule Commonly known as:  TAMIFLU Take 1 capsule (30 mg total) by mouth 2 (two) times daily for 3 doses. Ask about: Should I take this medication?      Follow-up Information    Richmond Campbell., PA-C. Schedule an appointment as soon as possible for a visit in 1 week(s).   Specialty:  Family Medicine Contact information: [address] [place] [address] [phone]            The results of significant diagnostics from this hospitalization (including imaging, microbiology, ancillary and laboratory) are listed below for reference.    Significant Diagnostic Studies: Dg Chest 2 View  Result Date: 11/17/2017 CLINICAL DATA:  Cough and fever EXAM: CHEST  2 VIEW COMPARISON:  None FINDINGS: The heart size and mediastinal contours are  within normal limits. Decreased lung volumes. The visualized skeletal structures are unremarkable. IMPRESSION: No active cardiopulmonary disease. Electronically Signed   By: Signa Kell M.D.   On: 11/17/2017 21:23    Microbiology: Recent Results (from the past 240 hour(s))  Urine culture     Status: Abnormal   Collection Time: 11/17/17  8:10 PM  Result Value Ref Range Status   Specimen Description URINE, CLEAN CATCH  Final   Special Requests NONE  Final   Culture (A)  Final    >=100,000 COLONIES/mL AEROCOCCUS SPECIES Standardized susceptibility testing for this organism is not available. Performed at [facility], [address] [place], [place] [address]    Report  Status 11/21/2017 FINAL  Final  Culture, blood (routine x 2)     Status: None (Preliminary result)   Collection Time: 11/17/17  9:08 PM  Result Value Ref Range Status   Specimen Description BLOOD RIGHT HAND  Final   Special Requests   Final    BOTTLES DRAWN AEROBIC AND ANAEROBIC Blood Culture adequate volume   Culture   Final    NO GROWTH 4 DAYS Performed at [facility], [address], [place], [place] [address]    Report Status PENDING  Incomplete  Culture, blood (routine x 2)     Status: None (Preliminary result)   Collection Time: 11/17/17  9:08 PM  Result Value Ref Range Status   Specimen Description BLOOD RIGHT ANTECUBITAL  Final   Special Requests IN PEDIATRIC BOTTLE Blood Culture adequate volume  Final   Culture   Final    NO GROWTH 4 DAYS Performed at [facility], [address], [place], [place] [address]    Report Status PENDING  Incomplete     Labs: Basic Metabolic Panel: Recent Labs  Lab 11/18/17 0604 11/19/17 0556 11/20/17 0625 11/21/17 0611 11/22/17 0705  NA 128* 128* 127* 131* 130*  K 3.8 3.0* 3.3* 3.5 4.1  CL 94* 94* 94* 97* 95*  CO2 24 24 24 27 28   GLUCOSE 110* 107* 99 103* 107*  BUN 13 10 10 10 10   CREATININE 0.75 0.69 0.70 0.79 0.69  CALCIUM 8.4* 8.5* 8.3*  8.5* 8.5*  MG  --  1.9  --  1.9  --    Liver Function Tests: Recent Labs  Lab 11/17/17 2050  AST 39  ALT 16  ALKPHOS 63  BILITOT 0.7  PROT 6.6  ALBUMIN 3.8   No results for input(s): LIPASE, AMYLASE in the last 168 hours. No results for input(s): AMMONIA in the last 168 hours. CBC: Recent Labs  Lab 11/17/17 2050 11/18/17 0604 11/19/17 0556 11/20/17 0625 11/21/17 0611 11/22/17 0705  WBC 4.4 5.4 5.3 4.1 4.6 5.9  NEUTROABS 2.6  --  3.1  --   --   --   HGB 10.6* 11.2* 12.3 11.9* 10.9* 11.0*  HCT 31.6* 33.0* 35.9* 34.5* 32.1* 32.4*  MCV 95.8 95.7 94.0 93.0 94.1 94.2  PLT 213 209 197 176 183 176   Cardiac Enzymes: No results for input(s): CKTOTAL, CKMB, CKMBINDEX, TROPONINI in the last 168 hours. BNP: BNP (last 3 results) No results for input(s): BNP in the last 8760 hours.  ProBNP (last 3 results) No results for input(s): PROBNP in the last 8760 hours.  CBG: Recent Labs  Lab 11/21/17 0724 11/21/17 1059 11/21/17 1726 11/21/17 2110 11/22/17 0750  GLUCAP 106* 126* 88 108* 105*

## 2017-11-25 DIAGNOSIS — R8290 Unspecified abnormal findings in urine: Secondary | ICD-10-CM | POA: Diagnosis not present

## 2017-11-25 DIAGNOSIS — J101 Influenza due to other identified influenza virus with other respiratory manifestations: Secondary | ICD-10-CM | POA: Diagnosis not present

## 2017-11-25 DIAGNOSIS — Z09 Encounter for follow-up examination after completed treatment for conditions other than malignant neoplasm: Secondary | ICD-10-CM | POA: Diagnosis not present

## 2017-11-26 DIAGNOSIS — R8290 Unspecified abnormal findings in urine: Secondary | ICD-10-CM | POA: Diagnosis not present

## 2017-11-27 DIAGNOSIS — S329XXA Fracture of unspecified parts of lumbosacral spine and pelvis, initial encounter for closed fracture: Secondary | ICD-10-CM | POA: Diagnosis not present

## 2017-11-27 DIAGNOSIS — H52203 Unspecified astigmatism, bilateral: Secondary | ICD-10-CM | POA: Diagnosis not present

## 2017-11-27 DIAGNOSIS — R2689 Other abnormalities of gait and mobility: Secondary | ICD-10-CM | POA: Diagnosis not present

## 2017-11-27 DIAGNOSIS — M6281 Muscle weakness (generalized): Secondary | ICD-10-CM | POA: Diagnosis not present

## 2017-11-27 DIAGNOSIS — H524 Presbyopia: Secondary | ICD-10-CM | POA: Diagnosis not present

## 2017-11-27 DIAGNOSIS — Z961 Presence of intraocular lens: Secondary | ICD-10-CM | POA: Diagnosis not present

## 2017-11-27 DIAGNOSIS — H353131 Nonexudative age-related macular degeneration, bilateral, early dry stage: Secondary | ICD-10-CM | POA: Diagnosis not present

## 2017-12-10 DIAGNOSIS — R2689 Other abnormalities of gait and mobility: Secondary | ICD-10-CM | POA: Diagnosis not present

## 2017-12-10 DIAGNOSIS — S329XXA Fracture of unspecified parts of lumbosacral spine and pelvis, initial encounter for closed fracture: Secondary | ICD-10-CM | POA: Diagnosis not present

## 2017-12-10 DIAGNOSIS — M6281 Muscle weakness (generalized): Secondary | ICD-10-CM | POA: Diagnosis not present

## 2017-12-21 NOTE — Progress Notes (Signed)
Cardiology Office Note:    Date:  12/22/2017   ID:  JOLAINE FRYBERGER, DOB July 03, 1923, MRN [id number]  PCP:  Richmond Campbell., PA-C  Cardiologist:  No primary care provider on file.    Referring MD: Richmond Campbell., PA-C   Chief Complaint  Patient presents with  . Congestive Heart Failure    History of Present Illness:    ULA COUVILLON is a 84 y.o. female with a hx of LE edema due to chronic diastolic CHF, DM type 2 and hyperlipidemia.  She was hospitalized this past January with influenza A and sepsis but no CHF.  She has now recovered from that and is here for routine followup.  She is here today for followup and is doing well.  She denies any chest pain or pressure, PND, orthopnea, LE edema, dizziness, palpitations or syncope. She has chronic DOE which is stable.  Her daughter is with her today and states that she has been developing dementia. She has fallen twice in the past few days.  She is compliant with her meds and is tolerating meds with no SE.    Past Medical History:  Diagnosis Date  . Carotid bruit    bilateral  . Closed pelvic fracture (HCC) 05/05/2017  . DEGENERATIVE JOINT DISEASE 07/28/2008  . DIABETES MELLITUS, TYPE II, CONTROLLED 07/27/2007  . HYPERCHOLESTEROLEMIA, MILD 07/27/2007  . Impaired glucose tolerance 08/07/2012  . OSTEOPOROSIS 07/14/2007  . Pelvic fracture (HCC) 04/04/2017    Past Surgical History:  Procedure Laterality Date  . ABDOMINAL HYSTERECTOMY    . CATARACT EXTRACTION    . TONSILLECTOMY      Current Medications: Current Meds  Medication Sig  . Ascorbic Acid (VITAMIN C PO) Take by mouth as directed.  Marland Kitchen aspirin 81 MG tablet Take 81 mg by mouth daily.    . cholecalciferol (VITAMIN D) 400 units TABS tablet Take 800 Units by mouth daily.  . furosemide (LASIX) 20 MG tablet Take 1 tablet (20 mg total) by mouth daily.  . Glucosamine-Chondroit-Vit C-Mn (GLUCOSAMINE CHONDR 1500 COMPLX PO) Take 1 tablet by mouth 2 (two) times daily.   . meloxicam  (MOBIC) 15 MG tablet TAKE 1 TABLET(15 MG) BY MOUTH DAILY  . methocarbamol (ROBAXIN) 500 MG tablet Take 1 tablet (500 mg total) by mouth every 8 (eight) hours as needed for muscle spasms. (Patient taking differently: Take 500 mg by mouth 2 (two) times daily. )  . Multiple Vitamin (MULTIVITAMIN) tablet Take 1 tablet by mouth daily.    . polyethylene glycol (MIRALAX / GLYCOLAX) packet Take 17 g by mouth 2 (two) times daily.  . sertraline (ZOLOFT) 50 MG tablet Take 75 mg by mouth daily.  . traMADol (ULTRAM) 50 MG tablet Take 1 tablet (50 mg total) by mouth every 6 (six) hours as needed.     Allergies:   Hydrocodone-acetaminophen and Oxycodone   Social History   Socioeconomic History  . Marital status: Widowed    Spouse name: None  . Number of children: None  . Years of education: None  . Highest education level: None  Social Needs  . Financial resource strain: None  . Food insecurity - worry: None  . Food insecurity - inability: None  . Transportation needs - medical: None  . Transportation needs - non-medical: None  Occupational History  . None  Tobacco Use  . Smoking status: Never Smoker  . Smokeless tobacco: Never Used  Substance and Sexual Activity  . Alcohol use: No  . Drug use:  No  . Sexual activity: None  Other Topics Concern  . None  Social History Narrative  . None     Family History: The patient's family history includes Hypertension in her other.  ROS:   Please see the history of present illness.    ROS  All other systems reviewed and negative.   EKGs/Labs/Other Studies Reviewed:    The following studies were reviewed today: none  EKG:  EKG is not ordered today.    Recent Labs: 11/17/2017: ALT 16 11/21/2017: Magnesium 1.9 11/22/2017: BUN 10; Creatinine, Ser 0.69; Hemoglobin 11.0; Platelets 176; Potassium 4.1; Sodium 130   Recent Lipid Panel    Component Value Date/Time   CHOL 189 08/14/2015 0941   TRIG 79.0 08/14/2015 0941   HDL 64.80 08/14/2015  0941   CHOLHDL 3 08/14/2015 0941   VLDL 15.8 08/14/2015 0941   LDLCALC 109 (H) 08/14/2015 0941    Physical Exam:    VS:  BP 118/64   Pulse 74   Ht 5' (1.524 m)   Wt 151 lb 9.6 oz (68.8 kg)   SpO2 97%   BMI 29.61 kg/m     Wt Readings from Last 3 Encounters:  12/22/17 151 lb 9.6 oz (68.8 kg)  11/18/17 155 lb (70.3 kg)  09/22/17 157 lb 12.8 oz (71.6 kg)     GEN:  Well nourished, well developed in no acute distress HEENT: Normal NECK: No JVD; No carotid bruits LYMPHATICS: No lymphadenopathy CARDIAC: RRR, no murmurs, rubs, gallops RESPIRATORY:  Clear to auscultation without rales, wheezing or rhonchi  ABDOMEN: Soft, non-tender, non-distended MUSCULOSKELETAL:  No edema; No deformity  SKIN: Warm and dry NEUROLOGIC:  Alert and oriented x 3 PSYCHIATRIC:  Normal affect   ASSESSMENT:    1. Chronic diastolic CHF (congestive heart failure) (HCC)   2. Edema extremities   3. HYPERCHOLESTEROLEMIA, MILD    PLAN:    In order of problems listed above:  1.  Chronic diastolic CHF - she appears euvolemic on exam today.  Her weight stable and has actually lose 6 lbs.  She has no volume overload.  She will continue on Lasix 20mg  daily.  2.  LE edema - this is well controlled on diuretics and compression hose. No change in dose.  3.  Hyperlipidemia - followed by her PCP.  No statin due to advanced age.   Medication Adjustments/Labs and Tests Ordered: Current medicines are reviewed at length with the patient today.  Concerns regarding medicines are outlined above.  No orders of the defined types were placed in this encounter.  No orders of the defined types were placed in this encounter.   Signed, Armanda Magicraci Shacoria Latif, MD  12/22/2017 10:48 AM    [facility]

## 2017-12-22 ENCOUNTER — Encounter: Payer: Self-pay | Admitting: Cardiology

## 2017-12-22 ENCOUNTER — Ambulatory Visit: Payer: PPO | Admitting: Cardiology

## 2017-12-22 VITALS — BP 118/64 | HR 74 | Ht 60.0 in | Wt 151.6 lb

## 2017-12-22 DIAGNOSIS — E7800 Pure hypercholesterolemia, unspecified: Secondary | ICD-10-CM

## 2017-12-22 DIAGNOSIS — I5032 Chronic diastolic (congestive) heart failure: Secondary | ICD-10-CM | POA: Diagnosis not present

## 2017-12-22 DIAGNOSIS — R600 Localized edema: Secondary | ICD-10-CM

## 2017-12-22 NOTE — Patient Instructions (Addendum)
Medication Instructions:  Your physician recommends that you continue on your current medications as directed. Please refer to the Current Medication list given to you today.  If you need a refill on your cardiac medications, please contact your pharmacy first.  Labwork: None ordered   Testing/Procedures: None ordered   Follow-Up: Your physician wants you to follow-up in: 1 year with Dr. Turner. You will receive a reminder letter in the mail two months in advance. If you don't receive a letter, please call our office to schedule the follow-up appointment.  Any Other Special Instructions Will Be Listed Below (If Applicable).   Thank you for choosing [facility]    Rena Lukasz Rogus, RN  [phone]  If you need a refill on your cardiac medications before your next appointment, please call your pharmacy.   

## 2017-12-23 DIAGNOSIS — G933 Postviral fatigue syndrome: Secondary | ICD-10-CM | POA: Diagnosis not present

## 2017-12-23 DIAGNOSIS — R296 Repeated falls: Secondary | ICD-10-CM | POA: Diagnosis not present

## 2017-12-23 DIAGNOSIS — R410 Disorientation, unspecified: Secondary | ICD-10-CM | POA: Diagnosis not present

## 2017-12-28 DIAGNOSIS — R2689 Other abnormalities of gait and mobility: Secondary | ICD-10-CM | POA: Diagnosis not present

## 2017-12-28 DIAGNOSIS — S329XXA Fracture of unspecified parts of lumbosacral spine and pelvis, initial encounter for closed fracture: Secondary | ICD-10-CM | POA: Diagnosis not present

## 2017-12-28 DIAGNOSIS — M6281 Muscle weakness (generalized): Secondary | ICD-10-CM | POA: Diagnosis not present

## 2018-01-07 DIAGNOSIS — R2689 Other abnormalities of gait and mobility: Secondary | ICD-10-CM | POA: Diagnosis not present

## 2018-01-07 DIAGNOSIS — M6281 Muscle weakness (generalized): Secondary | ICD-10-CM | POA: Diagnosis not present

## 2018-01-07 DIAGNOSIS — S329XXA Fracture of unspecified parts of lumbosacral spine and pelvis, initial encounter for closed fracture: Secondary | ICD-10-CM | POA: Diagnosis not present

## 2018-01-25 DIAGNOSIS — R2689 Other abnormalities of gait and mobility: Secondary | ICD-10-CM | POA: Diagnosis not present

## 2018-01-25 DIAGNOSIS — M6281 Muscle weakness (generalized): Secondary | ICD-10-CM | POA: Diagnosis not present

## 2018-01-25 DIAGNOSIS — S329XXA Fracture of unspecified parts of lumbosacral spine and pelvis, initial encounter for closed fracture: Secondary | ICD-10-CM | POA: Diagnosis not present

## 2018-02-07 DIAGNOSIS — R2689 Other abnormalities of gait and mobility: Secondary | ICD-10-CM | POA: Diagnosis not present

## 2018-02-07 DIAGNOSIS — S329XXA Fracture of unspecified parts of lumbosacral spine and pelvis, initial encounter for closed fracture: Secondary | ICD-10-CM | POA: Diagnosis not present

## 2018-02-07 DIAGNOSIS — M6281 Muscle weakness (generalized): Secondary | ICD-10-CM | POA: Diagnosis not present

## 2018-02-25 DIAGNOSIS — S329XXA Fracture of unspecified parts of lumbosacral spine and pelvis, initial encounter for closed fracture: Secondary | ICD-10-CM | POA: Diagnosis not present

## 2018-02-25 DIAGNOSIS — R2689 Other abnormalities of gait and mobility: Secondary | ICD-10-CM | POA: Diagnosis not present

## 2018-02-25 DIAGNOSIS — M6281 Muscle weakness (generalized): Secondary | ICD-10-CM | POA: Diagnosis not present

## 2018-03-09 DIAGNOSIS — R2689 Other abnormalities of gait and mobility: Secondary | ICD-10-CM | POA: Diagnosis not present

## 2018-03-09 DIAGNOSIS — S329XXA Fracture of unspecified parts of lumbosacral spine and pelvis, initial encounter for closed fracture: Secondary | ICD-10-CM | POA: Diagnosis not present

## 2018-03-09 DIAGNOSIS — M6281 Muscle weakness (generalized): Secondary | ICD-10-CM | POA: Diagnosis not present

## 2018-03-10 DIAGNOSIS — S3210XG Unspecified fracture of sacrum, subsequent encounter for fracture with delayed healing: Secondary | ICD-10-CM | POA: Diagnosis not present

## 2018-03-10 DIAGNOSIS — M545 Low back pain: Secondary | ICD-10-CM | POA: Diagnosis not present

## 2018-03-10 DIAGNOSIS — E871 Hypo-osmolality and hyponatremia: Secondary | ICD-10-CM | POA: Diagnosis not present

## 2018-03-10 DIAGNOSIS — R5383 Other fatigue: Secondary | ICD-10-CM | POA: Diagnosis not present

## 2018-03-12 DIAGNOSIS — Z6831 Body mass index (BMI) 31.0-31.9, adult: Secondary | ICD-10-CM | POA: Diagnosis not present

## 2018-03-12 DIAGNOSIS — M5136 Other intervertebral disc degeneration, lumbar region: Secondary | ICD-10-CM | POA: Diagnosis not present

## 2018-03-12 DIAGNOSIS — M47816 Spondylosis without myelopathy or radiculopathy, lumbar region: Secondary | ICD-10-CM | POA: Diagnosis not present

## 2018-03-27 DIAGNOSIS — R2689 Other abnormalities of gait and mobility: Secondary | ICD-10-CM | POA: Diagnosis not present

## 2018-03-27 DIAGNOSIS — S329XXA Fracture of unspecified parts of lumbosacral spine and pelvis, initial encounter for closed fracture: Secondary | ICD-10-CM | POA: Diagnosis not present

## 2018-03-27 DIAGNOSIS — M6281 Muscle weakness (generalized): Secondary | ICD-10-CM | POA: Diagnosis not present

## 2018-04-06 DIAGNOSIS — M5136 Other intervertebral disc degeneration, lumbar region: Secondary | ICD-10-CM | POA: Diagnosis not present

## 2018-04-09 DIAGNOSIS — S329XXA Fracture of unspecified parts of lumbosacral spine and pelvis, initial encounter for closed fracture: Secondary | ICD-10-CM | POA: Diagnosis not present

## 2018-04-09 DIAGNOSIS — M6281 Muscle weakness (generalized): Secondary | ICD-10-CM | POA: Diagnosis not present

## 2018-04-09 DIAGNOSIS — R2689 Other abnormalities of gait and mobility: Secondary | ICD-10-CM | POA: Diagnosis not present

## 2018-04-27 DIAGNOSIS — R2689 Other abnormalities of gait and mobility: Secondary | ICD-10-CM | POA: Diagnosis not present

## 2018-04-27 DIAGNOSIS — M5136 Other intervertebral disc degeneration, lumbar region: Secondary | ICD-10-CM | POA: Diagnosis not present

## 2018-04-27 DIAGNOSIS — M6281 Muscle weakness (generalized): Secondary | ICD-10-CM | POA: Diagnosis not present

## 2018-04-27 DIAGNOSIS — S329XXA Fracture of unspecified parts of lumbosacral spine and pelvis, initial encounter for closed fracture: Secondary | ICD-10-CM | POA: Diagnosis not present

## 2018-04-27 DIAGNOSIS — M47816 Spondylosis without myelopathy or radiculopathy, lumbar region: Secondary | ICD-10-CM | POA: Diagnosis not present

## 2018-05-09 DIAGNOSIS — R2689 Other abnormalities of gait and mobility: Secondary | ICD-10-CM | POA: Diagnosis not present

## 2018-05-09 DIAGNOSIS — S329XXA Fracture of unspecified parts of lumbosacral spine and pelvis, initial encounter for closed fracture: Secondary | ICD-10-CM | POA: Diagnosis not present

## 2018-05-09 DIAGNOSIS — M6281 Muscle weakness (generalized): Secondary | ICD-10-CM | POA: Diagnosis not present

## 2018-05-27 DIAGNOSIS — M6281 Muscle weakness (generalized): Secondary | ICD-10-CM | POA: Diagnosis not present

## 2018-05-27 DIAGNOSIS — R2689 Other abnormalities of gait and mobility: Secondary | ICD-10-CM | POA: Diagnosis not present

## 2018-05-27 DIAGNOSIS — S329XXA Fracture of unspecified parts of lumbosacral spine and pelvis, initial encounter for closed fracture: Secondary | ICD-10-CM | POA: Diagnosis not present

## 2018-07-20 ENCOUNTER — Emergency Department (HOSPITAL_COMMUNITY)
Admission: EM | Admit: 2018-07-20 | Discharge: 2018-07-20 | Disposition: A | Discharge status: Home / Self Care | Payer: PPO | Attending: Emergency Medicine | Admitting: Emergency Medicine

## 2018-07-20 ENCOUNTER — Emergency Department (HOSPITAL_COMMUNITY): Payer: PPO

## 2018-07-20 ENCOUNTER — Encounter (HOSPITAL_COMMUNITY): Payer: Self-pay | Admitting: Emergency Medicine

## 2018-07-20 ENCOUNTER — Other Ambulatory Visit: Payer: Self-pay

## 2018-07-20 DIAGNOSIS — Z87891 Personal history of nicotine dependence: Secondary | ICD-10-CM | POA: Diagnosis not present

## 2018-07-20 DIAGNOSIS — Z79899 Other long term (current) drug therapy: Secondary | ICD-10-CM | POA: Diagnosis not present

## 2018-07-20 DIAGNOSIS — R0789 Other chest pain: Secondary | ICD-10-CM | POA: Diagnosis not present

## 2018-07-20 DIAGNOSIS — R079 Chest pain, unspecified: Secondary | ICD-10-CM | POA: Insufficient documentation

## 2018-07-20 DIAGNOSIS — E119 Type 2 diabetes mellitus without complications: Secondary | ICD-10-CM | POA: Insufficient documentation

## 2018-07-20 DIAGNOSIS — Z7982 Long term (current) use of aspirin: Secondary | ICD-10-CM | POA: Insufficient documentation

## 2018-07-20 LAB — BASIC METABOLIC PANEL
ANION GAP: 12 (ref 5–15)
BUN: 20 mg/dL (ref 8–23)
CO2: 25 mmol/L (ref 22–32)
Calcium: 9.5 mg/dL (ref 8.9–10.3)
Chloride: 96 mmol/L — ABNORMAL LOW (ref 98–111)
Creatinine, Ser: 0.97 mg/dL (ref 0.44–1.00)
GFR, EST AFRICAN AMERICAN: 57 mL/min — AB (ref >60)
GFR, EST NON AFRICAN AMERICAN: 49 mL/min — AB (ref >60)
GLUCOSE: 106 mg/dL — AB (ref 70–99)
POTASSIUM: 4.4 mmol/L (ref 3.5–5.1)
SODIUM: 133 mmol/L — AB (ref 135–145)

## 2018-07-20 LAB — CBC
HEMATOCRIT: 33.4 % — AB (ref 36.0–46.0)
HEMOGLOBIN: 11.1 g/dL — AB (ref 12.0–15.0)
MCH: 32.6 pg (ref 26.0–34.0)
MCHC: 33.2 g/dL (ref 30.0–36.0)
MCV: 98.2 fL (ref 78.0–100.0)
Platelets: 350 10*3/uL (ref 150–400)
RBC: 3.4 MIL/uL — AB (ref 3.87–5.11)
RDW: 13.1 % (ref 11.5–15.5)
WBC: 6.5 10*3/uL (ref 4.0–10.5)

## 2018-07-20 LAB — POCT I-STAT TROPONIN I: Troponin i, poc: 0 ng/mL (ref 0.00–0.08)

## 2018-07-20 NOTE — ED Triage Notes (Signed)
Pt c/o lest chest pain under left breast since yesterday. Denies n/v/d. Reports took gas x and didn't relieve.

## 2018-07-20 NOTE — Discharge Instructions (Addendum)
With discomfort in your chest is likely from the chest wall.  For this discomfort try using heat on the sore area 3 or 4 times a day and taking Tylenol for pain.  Follow-up with your doctor if not better in 3 or 4 days.  Return here, if needed, for problems.

## 2018-07-20 NOTE — ED Notes (Signed)
Patient got up to BR in wheel chair and tolerated well. x1 assist.

## 2018-07-20 NOTE — ED Provider Notes (Signed)
[facility]-EMERGENCY DEPT Provider Note   CSN: [id number] Arrival date & time: 07/20/18  1735     History   Chief Complaint Chief Complaint  Patient presents with  . Chest Pain    HPI Stephanie Jenkins is a 84 y.o. female.  HPI  Presents for evaluation of chest discomfort, which feels like gas.  This discomfort worsens when she gets up and goes away when she lies down.  She denies shortness of breath, cough, weakness or dizziness.  Pain has been present for 1 month, and started without known trauma.  No prior similar problems.  No history of cardiac or pulmonary disorders.  She is here with her daughter who states that she is only heard the patient complained about it twice within the last week.  Patient lives alone.  There are no other no modifying factors.  Past Medical History:  Diagnosis Date  . Carotid bruit    bilateral  . Closed pelvic fracture (HCC) 05/05/2017  . DEGENERATIVE JOINT DISEASE 07/28/2008  . DIABETES MELLITUS, TYPE II, CONTROLLED 07/27/2007  . HYPERCHOLESTEROLEMIA, MILD 07/27/2007  . Impaired glucose tolerance 08/07/2012  . OSTEOPOROSIS 07/14/2007  . Pelvic fracture (HCC) 04/04/2017    Patient Active Problem List   Diagnosis Date Noted  . Influenza A 11/18/2017  . Abnormal urinalysis 11/18/2017  . Diabetes mellitus type 2, controlled (HCC) 11/18/2017  . Hyponatremia 11/18/2017  . Normocytic normochromic anemia 11/18/2017  . Chronic diastolic CHF (congestive heart failure) (HCC) 09/22/2017  . Edema extremities 08/04/2017  . DOE (dyspnea on exertion) 08/04/2017  . Closed pelvic fracture (HCC) 05/05/2017  . Pelvic fracture (HCC) 04/04/2017  . Impaired glucose tolerance 08/07/2012  . Osteoarthritis 07/28/2008  . HYPERCHOLESTEROLEMIA, MILD 07/27/2007  . Osteoporosis 07/14/2007    Past Surgical History:  Procedure Laterality Date  . ABDOMINAL HYSTERECTOMY    . CATARACT EXTRACTION    . TONSILLECTOMY       OB History   None       Home Medications    Prior to Admission medications   Medication Sig Start Date End Date Taking? Authorizing Provider  Ascorbic Acid (VITAMIN C PO) Take by mouth as directed.   Yes [provider]  aspirin 81 MG tablet Take 81 mg by mouth daily.     Yes [provider]  cholecalciferol (VITAMIN D) 400 units TABS tablet Take 800 Units by mouth daily.   Yes [provider]  furosemide (LASIX) 20 MG tablet Take 1 tablet (20 mg total) by mouth daily. 08/04/17 07/20/18 Yes Turner, Cornelious Bryantraci R, MD  Glucosamine-Chondroit-Vit C-Mn (GLUCOSAMINE CHONDR 1500 COMPLX PO) Take 1 tablet by mouth 2 (two) times daily.    Yes [provider]  meloxicam (MOBIC) 15 MG tablet TAKE 1 TABLET(15 MG) BY MOUTH DAILY Patient taking differently: Take 15 mg by mouth daily.  05/15/17  Yes Gordy SaversKwiatkowski, Peter F, MD  methocarbamol (ROBAXIN) 500 MG tablet Take 1 tablet (500 mg total) by mouth every 8 (eight) hours as needed for muscle spasms. Patient taking differently: Take 500 mg by mouth 2 (two) times daily.  05/08/17  Yes Noralee Stainhoi, Jennifer, DO  Multiple Vitamin (MULTIVITAMIN) tablet Take 1 tablet by mouth daily.     Yes [provider]  polyethylene glycol (MIRALAX / GLYCOLAX) packet Take 17 g by mouth 2 (two) times daily. 05/06/17  Yes Richarda OverlieAbrol, Nayana, MD  sertraline (ZOLOFT) 50 MG tablet Take 75 mg by mouth daily.   Yes [provider]  traMADol Janean Sark(ULTRAM) 50  MG tablet Take 1 tablet (50 mg total) by mouth every 6 (six) hours as needed. Patient taking differently: Take 50 mg by mouth every 6 (six) hours as needed. 100 mg in the am, 50 mg at lunch and 50 in the pm 05/16/17  Yes Gordy Savers, MD    Family History Family History  Problem Relation Age of Onset  . Hypertension Other     Social History Social History   Tobacco Use  . Smoking status: Never Smoker  . Smokeless tobacco: Former Neurosurgeon    Types: Chew  Substance Use Topics  . Alcohol use: No  . Drug use: No      Allergies   Hydrocodone-acetaminophen and Oxycodone   Review of Systems Review of Systems  All other systems reviewed and are negative.    Physical Exam Updated Vital Signs BP (!) 162/81 (BP Location: Right Arm)   Pulse 82   Temp 98.5 F (36.9 C) (Oral)   Resp 15   SpO2 97%   Physical Exam  Constitutional: She is oriented to person, place, and time. She appears well-developed. She does not appear ill. No distress.  Elderly, frail  HENT:  Head: Normocephalic and atraumatic.  Eyes: Pupils are equal, round, and reactive to light. Conjunctivae and EOM are normal.  Neck: Normal range of motion and phonation normal. Neck supple.  Cardiovascular: Normal rate and regular rhythm.  Pulmonary/Chest: Effort normal and breath sounds normal. She exhibits no tenderness.  No crepitation or deformity of the chest wall.  Abdominal: Soft. She exhibits no distension. There is no tenderness. There is no guarding.  Musculoskeletal: Normal range of motion.  Neurological: She is alert and oriented to person, place, and time. She exhibits normal muscle tone.  Skin: Skin is warm and dry.  Psychiatric: She has a normal mood and affect. Her behavior is normal.  Nursing note and vitals reviewed.    ED Treatments / Results  Labs (all labs ordered are listed, but only abnormal results are displayed) Labs Reviewed  BASIC METABOLIC PANEL - Abnormal; Notable for the following components:      Result Value   Sodium 133 (*)    Chloride 96 (*)    Glucose, Bld 106 (*)    GFR calc non Af Amer 49 (*)    GFR calc Af Amer 57 (*)    All other components within normal limits  CBC - Abnormal; Notable for the following components:   RBC 3.40 (*)    Hemoglobin 11.1 (*)    HCT 33.4 (*)    All other components within normal limits  I-STAT TROPONIN, ED  POCT I-STAT TROPONIN I    EKG EKG Interpretation  Date/Time:  Monday July 20 2018 17:45:40 EDT Ventricular Rate:  72 PR  Interval:  156 QRS Duration: 102 QT Interval:  420 QTC Calculation: 459 R Axis:   -61 Text Interpretation:  Normal sinus rhythm Left axis deviation Incomplete right bundle branch block Anteroseptal infarct , age undetermined Abnormal ECG No old tracing to compare Confirmed by Mancel Bale [phone]) on 07/20/2018 10:01:06 PM   Radiology Dg Chest 2 View  Result Date: 07/20/2018 CLINICAL DATA:  Chest pain EXAM: CHEST - 2 VIEW COMPARISON:  11/17/2017 FINDINGS: The heart size and mediastinal contours are within normal limits. Both lungs are clear. Degenerative changes of the spine. IMPRESSION: No active cardiopulmonary disease. Electronically Signed   By: Jasmine Pang M.D.   On: 07/20/2018 18:10    Procedures Procedures (including critical care  time)  Medications Ordered in ED Medications - No data to display   Initial Impression / Assessment and Plan / ED Course  I have reviewed the triage vital signs and the nursing notes.  Pertinent labs & imaging results that were available during my care of the patient were reviewed by me and considered in my medical decision making (see chart for details).  Clinical Course as of Jul 20 2214  Mon Jul 20, 2018  2200 Normal  POCT i-Stat troponin I [EW]  2201 Normal except sodium low, chloride low, glucose high, GFR low  Basic metabolic panel(!) [EW]  2201 Normal except hemoglobin low  CBC(!) [EW]  2201 No  infiltrate or CHF, images reviewed by me  DG Chest 2 View [EW]    Clinical Course User Index [EW] Mancel Bale, MD     Patient Vitals for the past 24 hrs:  BP Temp Temp src Pulse Resp SpO2  07/20/18 2108 (!) 162/81 - - 82 - 97 %  07/20/18 2017 (!) 187/106 - - 89 15 100 %  07/20/18 1808 (!) 142/62 98.5 F (36.9 C) Oral 76 16 98 %    10:15 PM Reevaluation with update and discussion. After initial assessment and treatment, an updated evaluation reveals no change in clinical status, findings discussed with patient, and daughter, all  questions answered. Mancel Bale   Medical Decision Making: Nonspecific chest pain, suspect musculoskeletal.  Doubt ACS, pneumonia or PE.  Mild hypertension, without signs of hypertensive encephalopathy or hypertensive urgency.  No indication for the ED treatment or evaluation.  CRITICAL CARE-no Performed by: Mancel Bale  Nursing Notes Reviewed/ Care Coordinated Applicable Imaging Reviewed Interpretation of Laboratory Data incorporated into ED treatment  The patient appears reasonably screened and/or stabilized for discharge and I doubt any other medical condition or other [facility] requiring further screening, evaluation, or treatment in the ED at this time prior to discharge.  Plan: Home Medications-APAP for pain; Home Treatments-rest, fluids, heat; return here if the recommended treatment, does not improve the symptoms; Recommended follow up-PCP, PRN   Final Clinical Impressions(s) / ED Diagnoses   Final diagnoses:  Nonspecific chest pain    ED Discharge Orders    None       Mancel Bale, MD 07/20/18 2216

## 2018-08-02 ENCOUNTER — Other Ambulatory Visit: Payer: Self-pay | Admitting: Cardiology

## 2018-08-06 DIAGNOSIS — R5383 Other fatigue: Secondary | ICD-10-CM | POA: Diagnosis not present

## 2018-08-25 DIAGNOSIS — R5383 Other fatigue: Secondary | ICD-10-CM | POA: Diagnosis not present

## 2018-08-25 DIAGNOSIS — Z79899 Other long term (current) drug therapy: Secondary | ICD-10-CM | POA: Diagnosis not present

## 2018-08-25 DIAGNOSIS — F3341 Major depressive disorder, recurrent, in partial remission: Secondary | ICD-10-CM | POA: Diagnosis not present

## 2018-09-02 DIAGNOSIS — M5136 Other intervertebral disc degeneration, lumbar region: Secondary | ICD-10-CM | POA: Diagnosis not present

## 2018-09-08 DIAGNOSIS — G8929 Other chronic pain: Secondary | ICD-10-CM | POA: Diagnosis not present

## 2018-09-08 DIAGNOSIS — M545 Low back pain: Secondary | ICD-10-CM | POA: Diagnosis not present

## 2018-09-08 DIAGNOSIS — I5032 Chronic diastolic (congestive) heart failure: Secondary | ICD-10-CM | POA: Diagnosis not present

## 2018-09-08 DIAGNOSIS — F3341 Major depressive disorder, recurrent, in partial remission: Secondary | ICD-10-CM | POA: Diagnosis not present

## 2018-09-08 DIAGNOSIS — Z0000 Encounter for general adult medical examination without abnormal findings: Secondary | ICD-10-CM | POA: Diagnosis not present

## 2018-09-09 DIAGNOSIS — M47816 Spondylosis without myelopathy or radiculopathy, lumbar region: Secondary | ICD-10-CM | POA: Diagnosis not present

## 2018-09-09 DIAGNOSIS — M5136 Other intervertebral disc degeneration, lumbar region: Secondary | ICD-10-CM | POA: Diagnosis not present

## 2018-09-09 DIAGNOSIS — M545 Low back pain: Secondary | ICD-10-CM | POA: Diagnosis not present

## 2018-09-09 DIAGNOSIS — M4150 Other secondary scoliosis, site unspecified: Secondary | ICD-10-CM | POA: Diagnosis not present

## 2018-09-23 DIAGNOSIS — M4150 Other secondary scoliosis, site unspecified: Secondary | ICD-10-CM | POA: Diagnosis not present

## 2018-09-23 DIAGNOSIS — M47816 Spondylosis without myelopathy or radiculopathy, lumbar region: Secondary | ICD-10-CM | POA: Diagnosis not present

## 2018-09-23 DIAGNOSIS — M5136 Other intervertebral disc degeneration, lumbar region: Secondary | ICD-10-CM | POA: Diagnosis not present

## 2018-10-23 DIAGNOSIS — R8290 Unspecified abnormal findings in urine: Secondary | ICD-10-CM | POA: Diagnosis not present

## 2018-10-26 DIAGNOSIS — M5136 Other intervertebral disc degeneration, lumbar region: Secondary | ICD-10-CM | POA: Diagnosis not present

## 2018-10-27 DIAGNOSIS — K5909 Other constipation: Secondary | ICD-10-CM | POA: Diagnosis not present

## 2018-10-31 ENCOUNTER — Emergency Department (HOSPITAL_COMMUNITY)
Admission: EM | Admit: 2018-10-31 | Discharge: 2018-10-31 | Disposition: A | Discharge status: Home / Self Care | Payer: PPO | Attending: Emergency Medicine | Admitting: Emergency Medicine

## 2018-10-31 ENCOUNTER — Emergency Department (HOSPITAL_COMMUNITY): Payer: PPO

## 2018-10-31 ENCOUNTER — Other Ambulatory Visit: Payer: Self-pay

## 2018-10-31 ENCOUNTER — Encounter (HOSPITAL_COMMUNITY): Payer: Self-pay | Admitting: Emergency Medicine

## 2018-10-31 DIAGNOSIS — Z7982 Long term (current) use of aspirin: Secondary | ICD-10-CM | POA: Insufficient documentation

## 2018-10-31 DIAGNOSIS — Z79899 Other long term (current) drug therapy: Secondary | ICD-10-CM | POA: Insufficient documentation

## 2018-10-31 DIAGNOSIS — E119 Type 2 diabetes mellitus without complications: Secondary | ICD-10-CM | POA: Diagnosis not present

## 2018-10-31 DIAGNOSIS — I5032 Chronic diastolic (congestive) heart failure: Secondary | ICD-10-CM | POA: Diagnosis not present

## 2018-10-31 DIAGNOSIS — K6389 Other specified diseases of intestine: Secondary | ICD-10-CM | POA: Diagnosis not present

## 2018-10-31 DIAGNOSIS — K2980 Duodenitis without bleeding: Secondary | ICD-10-CM | POA: Diagnosis not present

## 2018-10-31 DIAGNOSIS — Z87891 Personal history of nicotine dependence: Secondary | ICD-10-CM | POA: Diagnosis not present

## 2018-10-31 DIAGNOSIS — R109 Unspecified abdominal pain: Secondary | ICD-10-CM | POA: Diagnosis not present

## 2018-10-31 DIAGNOSIS — R1010 Upper abdominal pain, unspecified: Secondary | ICD-10-CM | POA: Diagnosis present

## 2018-10-31 LAB — CBC WITH DIFFERENTIAL/PLATELET
Abs Immature Granulocytes: 0.06 10*3/uL (ref 0.00–0.07)
Basophils Absolute: 0.1 10*3/uL (ref 0.0–0.1)
Basophils Relative: 1 %
EOS ABS: 0.4 10*3/uL (ref 0.0–0.5)
Eosinophils Relative: 4 %
HCT: 33.5 % — ABNORMAL LOW (ref 36.0–46.0)
Hemoglobin: 10.6 g/dL — ABNORMAL LOW (ref 12.0–15.0)
Immature Granulocytes: 1 %
LYMPHS ABS: 1.6 10*3/uL (ref 0.7–4.0)
Lymphocytes Relative: 16 %
MCH: 31.5 pg (ref 26.0–34.0)
MCHC: 31.6 g/dL (ref 30.0–36.0)
MCV: 99.4 fL (ref 80.0–100.0)
Monocytes Absolute: 1.3 10*3/uL — ABNORMAL HIGH (ref 0.1–1.0)
Monocytes Relative: 13 %
Neutro Abs: 6.6 10*3/uL (ref 1.7–7.7)
Neutrophils Relative %: 65 %
Platelets: 317 10*3/uL (ref 150–400)
RBC: 3.37 MIL/uL — ABNORMAL LOW (ref 3.87–5.11)
RDW: 13.2 % (ref 11.5–15.5)
WBC: 9.9 10*3/uL (ref 4.0–10.5)
nRBC: 0 % (ref 0.0–0.2)

## 2018-10-31 LAB — URINALYSIS, ROUTINE W REFLEX MICROSCOPIC
Bilirubin Urine: NEGATIVE
Glucose, UA: NEGATIVE mg/dL
Hgb urine dipstick: NEGATIVE
Ketones, ur: 5 mg/dL — AB
Leukocytes, UA: NEGATIVE
Nitrite: NEGATIVE
Protein, ur: NEGATIVE mg/dL
Specific Gravity, Urine: 1.017 (ref 1.005–1.030)
pH: 9 — ABNORMAL HIGH (ref 5.0–8.0)

## 2018-10-31 LAB — COMPREHENSIVE METABOLIC PANEL
ALT: 14 U/L (ref 0–44)
AST: 20 U/L (ref 15–41)
Albumin: 4 g/dL (ref 3.5–5.0)
Alkaline Phosphatase: 72 U/L (ref 38–126)
Anion gap: 10 (ref 5–15)
BUN: 22 mg/dL (ref 8–23)
CO2: 28 mmol/L (ref 22–32)
Calcium: 8.7 mg/dL — ABNORMAL LOW (ref 8.9–10.3)
Chloride: 97 mmol/L — ABNORMAL LOW (ref 98–111)
Creatinine, Ser: 1.47 mg/dL — ABNORMAL HIGH (ref 0.44–1.00)
GFR calc Af Amer: 35 mL/min — ABNORMAL LOW (ref >60)
GFR calc non Af Amer: 30 mL/min — ABNORMAL LOW (ref >60)
Glucose, Bld: 117 mg/dL — ABNORMAL HIGH (ref 70–99)
Potassium: 4.6 mmol/L (ref 3.5–5.1)
Sodium: 135 mmol/L (ref 135–145)
TOTAL PROTEIN: 7.1 g/dL (ref 6.5–8.1)
Total Bilirubin: 0.9 mg/dL (ref 0.3–1.2)

## 2018-10-31 LAB — LIPASE, BLOOD: Lipase: 32 U/L (ref 11–51)

## 2018-10-31 LAB — POC OCCULT BLOOD, ED: Fecal Occult Bld: NEGATIVE

## 2018-10-31 MED ORDER — SODIUM CHLORIDE (PF) 0.9 % IJ SOLN
INTRAMUSCULAR | Status: AC
  Filled 2018-10-31: qty 50

## 2018-10-31 MED ORDER — SODIUM CHLORIDE 0.9 % IV BOLUS
1000.0000 mL | Freq: Once | INTRAVENOUS | Status: AC
  Administered 2018-10-31: 1000 mL via INTRAVENOUS

## 2018-10-31 MED ORDER — IOPAMIDOL (ISOVUE-300) INJECTION 61%
80.0000 mL | Freq: Once | INTRAVENOUS | Status: AC | PRN
  Administered 2018-10-31: 80 mL via INTRAVENOUS

## 2018-10-31 MED ORDER — PANTOPRAZOLE SODIUM 20 MG PO TBEC: 20.0000 mg | DELAYED_RELEASE_TABLET | Freq: Every day | ORAL | 0 refills | Status: AC

## 2018-10-31 MED ORDER — SUCRALFATE 1 G PO TABS: 1.0000 g | ORAL_TABLET | Freq: Three times a day (TID) | ORAL | 0 refills | Status: DC

## 2018-10-31 MED ORDER — CIPROFLOXACIN HCL 500 MG PO TABS: 500.0000 mg | ORAL_TABLET | Freq: Two times a day (BID) | ORAL | 0 refills | Status: DC

## 2018-10-31 MED ORDER — IOPAMIDOL (ISOVUE-300) INJECTION 61%
INTRAVENOUS | Status: AC
  Filled 2018-10-31: qty 100

## 2018-10-31 MED ORDER — METRONIDAZOLE 500 MG PO TABS: 500.0000 mg | ORAL_TABLET | Freq: Two times a day (BID) | ORAL | 0 refills | Status: DC

## 2018-10-31 NOTE — ED Provider Notes (Signed)
[facility]-EMERGENCY DEPT Provider Note   CSN: [id number] Arrival date & time: 10/31/18  1001     History   Chief Complaint Chief Complaint  Patient presents with  . Abdominal Pain    HPI Stephanie Jenkins is a 84 y.o. female.  HPI   Stephanie Jenkins is a 84 y.o. female, with a history of DM, hypercholesterolemia, presenting to the ED with abdominal pain for the last week.  Accompanied by her daughter and her caregiver at the bedside.   Pain is located in the upper abdomen, vague description, constant, moderate to severe, radiates across the upper abdomen and sometimes throughout the abdomen. Also endorses constipation. Typically uses miralax daily and typically has a daily BM.  Accompanied by nausea, decreased activity, abdominal distension, and decreased appetite. Had a large black/dark green stool on Dec 25 after mag citrate and dulcolax suppository. Since then has had small solid BMs with mostly liquid. Denies use of NSAIDs, alcohol, tobacco. Denies vomiting, fever, hematochezia, CP, back pain, urinary complaints, or any other complaints.    Past Medical History:  Diagnosis Date  . Carotid bruit    bilateral  . Closed pelvic fracture (HCC) 05/05/2017  . DEGENERATIVE JOINT DISEASE 07/28/2008  . DIABETES MELLITUS, TYPE II, CONTROLLED 07/27/2007  . HYPERCHOLESTEROLEMIA, MILD 07/27/2007  . Impaired glucose tolerance 08/07/2012  . OSTEOPOROSIS 07/14/2007  . Pelvic fracture (HCC) 04/04/2017    Patient Active Problem List   Diagnosis Date Noted  . Influenza A 11/18/2017  . Abnormal urinalysis 11/18/2017  . Diabetes mellitus type 2, controlled (HCC) 11/18/2017  . Hyponatremia 11/18/2017  . Normocytic normochromic anemia 11/18/2017  . Chronic diastolic CHF (congestive heart failure) (HCC) 09/22/2017  . Edema extremities 08/04/2017  . DOE (dyspnea on exertion) 08/04/2017  . Closed pelvic fracture (HCC) 05/05/2017  . Pelvic fracture (HCC) 04/04/2017  . Impaired  glucose tolerance 08/07/2012  . Osteoarthritis 07/28/2008  . HYPERCHOLESTEROLEMIA, MILD 07/27/2007  . Osteoporosis 07/14/2007    Past Surgical History:  Procedure Laterality Date  . ABDOMINAL HYSTERECTOMY    . CATARACT EXTRACTION    . TONSILLECTOMY       OB History   No obstetric history on file.      Home Medications    Prior to Admission medications   Medication Sig Start Date End Date Taking? Authorizing Provider  Ascorbic Acid (VITAMIN C PO) Take by mouth as directed.    [provider]  aspirin 81 MG tablet Take 81 mg by mouth daily.      [provider]  cholecalciferol (VITAMIN D) 400 units TABS tablet Take 800 Units by mouth daily.    [provider]  ciprofloxacin (CIPRO) 500 MG tablet Take 1 tablet (500 mg total) by mouth 2 (two) times daily. 10/31/18   Leo Weyandt C, PA-C  furosemide (LASIX) 20 MG tablet TAKE 1 TABLET BY MOUTH DAILY 08/03/18   Turner, Cornelious Bryant, MD  Glucosamine-Chondroit-Vit C-Mn (GLUCOSAMINE CHONDR 1500 COMPLX PO) Take 1 tablet by mouth 2 (two) times daily.     [provider]  meloxicam (MOBIC) 15 MG tablet TAKE 1 TABLET(15 MG) BY MOUTH DAILY Patient taking differently: Take 15 mg by mouth daily.  05/15/17   Gordy Savers, MD  methocarbamol (ROBAXIN) 500 MG tablet Take 1 tablet (500 mg total) by mouth every 8 (eight) hours as needed for muscle spasms. Patient taking differently: Take 500 mg by mouth 2 (two) times daily.  05/08/17   Noralee Stain, DO  metroNIDAZOLE (  FLAGYL) 500 MG tablet Take 1 tablet (500 mg total) by mouth 2 (two) times daily. 10/31/18   Leisha Trinkle C, PA-C  Multiple Vitamin (MULTIVITAMIN) tablet Take 1 tablet by mouth daily.      [provider]  pantoprazole (PROTONIX) 20 MG tablet Take 1 tablet (20 mg total) by mouth daily for 14 days. 10/31/18 11/14/18  Cheron Pasquarelli C, PA-C  polyethylene glycol (MIRALAX / GLYCOLAX) packet Take 17 g by mouth 2 (two) times daily. 05/06/17   Richarda OverlieAbrol, Nayana, MD    sertraline (ZOLOFT) 50 MG tablet Take 75 mg by mouth daily.    [provider]  sucralfate (CARAFATE) 1 g tablet Take 1 tablet (1 g total) by mouth 4 (four) times daily -  with meals and at bedtime for 20 days. 10/31/18 11/20/18  Matthan Sledge C, PA-C  traMADol (ULTRAM) 50 MG tablet Take 1 tablet (50 mg total) by mouth every 6 (six) hours as needed. Patient taking differently: Take 50 mg by mouth every 6 (six) hours as needed. 100 mg in the am, 50 mg at lunch and 50 in the pm 05/16/17   Gordy SaversKwiatkowski, Peter F, MD    Family History Family History  Problem Relation Age of Onset  . Hypertension Other     Social History Social History   Tobacco Use  . Smoking status: Never Smoker  . Smokeless tobacco: Former NeurosurgeonUser    Types: Chew  Substance Use Topics  . Alcohol use: No  . Drug use: No     Allergies   Hydrocodone-acetaminophen and Oxycodone   Review of Systems Review of Systems  Constitutional: Positive for activity change and appetite change. Negative for chills and fever.  Respiratory: Negative for shortness of breath.   Cardiovascular: Negative for chest pain.  Gastrointestinal: Positive for abdominal distention, abdominal pain, constipation and nausea. Negative for blood in stool and vomiting.  Genitourinary: Negative for dysuria and hematuria.  Musculoskeletal: Negative for back pain.  Neurological: Negative for weakness.  All other systems reviewed and are negative.    Physical Exam Updated Vital Signs BP (!) 93/54 (BP Location: Left Arm)   Pulse 94   Temp 97.9 F (36.6 C) (Oral)   Resp 20   SpO2 100%   Physical Exam Vitals signs and nursing note reviewed.  Constitutional:      General: She is not in acute distress.    Appearance: She is well-developed. She is not diaphoretic.  HENT:     Head: Normocephalic and atraumatic.  Eyes:     Conjunctiva/sclera: Conjunctivae normal.  Neck:     Musculoskeletal: Neck supple.  Cardiovascular:     Rate and  Rhythm: Normal rate and regular rhythm.     Heart sounds: Normal heart sounds.  Pulmonary:     Effort: Pulmonary effort is normal. No respiratory distress.     Breath sounds: Normal breath sounds.  Abdominal:     General: Bowel sounds are increased. There is no distension.     Palpations: Abdomen is soft.     Tenderness: There is generalized abdominal tenderness. There is no guarding.  Genitourinary:    Comments: No external hemorrhoids, fissures, or lesions noted. No gross blood, melena, or stool burden. No rectal tenderness. No foreign bodies noted. Med Tech, Fleet ContrasRachel, served as chaperone during the rectal exam. Lymphadenopathy:     Cervical: No cervical adenopathy.  Skin:    General: Skin is warm and dry.  Neurological:     Mental Status: She is alert.  Psychiatric:        Behavior: Behavior normal.      ED Treatments / Results  Labs (all labs ordered are listed, but only abnormal results are displayed) Labs Reviewed  COMPREHENSIVE METABOLIC PANEL - Abnormal; Notable for the following components:      Result Value   Chloride 97 (*)    Glucose, Bld 117 (*)    Creatinine, Ser 1.47 (*)    Calcium 8.7 (*)    GFR calc non Af Amer 30 (*)    GFR calc Af Amer 35 (*)    All other components within normal limits  URINALYSIS, ROUTINE W REFLEX MICROSCOPIC - Abnormal; Notable for the following components:   Color, Urine STRAW (*)    pH 9.0 (*)    Ketones, ur 5 (*)    All other components within normal limits  CBC WITH DIFFERENTIAL/PLATELET - Abnormal; Notable for the following components:   RBC 3.37 (*)    Hemoglobin 10.6 (*)    HCT 33.5 (*)    Monocytes Absolute 1.3 (*)    All other components within normal limits  LIPASE, BLOOD  POC OCCULT BLOOD, ED   Hemoglobin  Date Value Ref Range Status  10/31/2018 10.6 (L) 12.0 - 15.0 g/dL Final  40/98/1191 47.8 (L) 12.0 - 15.0 g/dL Final  29/56/2130 86.5 (L) 12.0 - 15.0 g/dL Final  78/46/9629 52.8 (L) 12.0 - 15.0 g/dL Final     EKG None  Radiology Ct Abdomen Pelvis W Contrast  Result Date: 10/31/2018 CLINICAL DATA:  Patient with mid abdominal pain. EXAM: CT ABDOMEN AND PELVIS WITH CONTRAST TECHNIQUE: Multidetector CT imaging of the abdomen and pelvis was performed using the standard protocol following bolus administration of intravenous contrast. CONTRAST:  80mL ISOVUE-300 IOPAMIDOL (ISOVUE-300) INJECTION 61% COMPARISON:  None. FINDINGS: Lower chest: Normal heart size. Mitral annular calcifications. Dependent scarring/atelectasis within the lower lobes bilaterally. No pleural effusion. Hepatobiliary: Liver is normal in size and contour. No focal lesion identified. Gallbladder is unremarkable. No intrahepatic or extrahepatic biliary ductal dilatation. Pancreas: Unremarkable Spleen: Unremarkable Adrenals/Urinary Tract: Normal adrenal glands. Kidneys enhance symmetrically with contrast. No hydronephrosis. Urinary bladder is unremarkable. Stomach/Bowel: There is circumferential wall thickening of the gastric antrum/proximal duodenum (image 20 6-27; series 2). There is surrounding fat stranding. No evidence for small bowel obstruction. Normal appearance of the colon. Vascular/Lymphatic: Normal caliber abdominal aorta. Peripheral calcified atherosclerotic plaque. No retroperitoneal lymphadenopathy. Reproductive: Status post hysterectomy. Other: None. Musculoskeletal: Chronic bilateral sacral ala fractures. Old right inferior pubic ramus fracture. Lumbar spine degenerative changes. Thoracic spine degenerative changes. No aggressive or acute appearing osseous lesions. IMPRESSION: Circumferential wall thickening of the gastric antrum/proximal duodenum with surrounding fat stranding concerning for duodenitis. Findings may be secondary to infectious or inflammatory process. Underlying mass or ulceration not excluded. Consider further evaluation with upper endoscopy. Aortic Atherosclerosis (ICD10-I70.0). Electronically Signed   By: Annia Belt M.D.   On: 10/31/2018 15:55    Procedures Procedures (including critical care time)  Medications Ordered in ED Medications  iopamidol (ISOVUE-300) 61 % injection (  Hold 10/31/18 1524)  sodium chloride (PF) 0.9 % injection (0 mLs  Hold 10/31/18 1524)  sodium chloride 0.9 % bolus 1,000 mL (0 mLs Intravenous Stopped 10/31/18 1606)  iopamidol (ISOVUE-300) 61 % injection 80 mL (80 mLs Intravenous Contrast Given 10/31/18 1525)     Initial Impression / Assessment and Plan / ED Course  I have reviewed the triage vital signs and the nursing notes.  Pertinent labs & imaging results  that were available during my care of the patient were reviewed by me and considered in my medical decision making (see chart for details).  Clinical Course as of Nov 01 1803  Sat Oct 31, 2018  1345 I have a suspicion that this may not have been an accurate BP given the subsequent blood pressure readings.  BP(!): 93/54 [SJ]  1750 Patient states she feels much better.  Pain has significantly improved.  Tolerating p.o. without difficulty.  States she has regained some of her appetite as well.   [SJ]    Clinical Course User Index [SJ] Brison Fiumara C, PA-C   Patient presents with abdominal pain. Patient is nontoxic appearing, afebrile, not tachycardic, not tachypneic, not hypotensive.  No leukocytosis.  Evidence of duodenitis on CT.  Patient began having regular bowel movements while here in the ED.  She had improved pain during her ED course and subsequent abdominal exams were benign. The patient, her daughter, and her caregiver were given instructions for home care as well as return precautions.  All parties voice understanding of these instructions, accept the plan, and are comfortable with discharge.  Findings and plan of care discussed with Virgina NorfolkAdam Curatolo, DO. Dr. Lockie Molauratolo personally evaluated and examined this patient.  Vitals:   10/31/18 1027 10/31/18 1341 10/31/18 1500 10/31/18 1630  BP: (!) 93/54 (!)  156/80 (!) 161/81 (!) 115/99  Pulse: 94 80 89 86  Resp: 20 19 18 19   Temp: 97.9 F (36.6 C)     TempSrc: Oral     SpO2: 100% 100% 95% 96%     Final Clinical Impressions(s) / ED Diagnoses   Final diagnoses:  Duodenitis    ED Discharge Orders         Ordered    ciprofloxacin (CIPRO) 500 MG tablet  2 times daily     10/31/18 1706    metroNIDAZOLE (FLAGYL) 500 MG tablet  2 times daily     10/31/18 1706    sucralfate (CARAFATE) 1 g tablet  3 times daily with meals & bedtime     10/31/18 1706    pantoprazole (PROTONIX) 20 MG tablet  Daily     10/31/18 1706           Anselm PancoastJoy, Dequane Strahan C, PA-C 10/31/18 1915    Virgina NorfolkCuratolo, Adam, DO 11/01/18 1641

## 2018-10-31 NOTE — ED Notes (Signed)
I attempted to collect labs and was unsuccessful. 

## 2018-10-31 NOTE — Discharge Instructions (Addendum)
There is evidence of duodenitis on the CT scan.  Diet: Start with a clear liquid diet, progressed to a full liquid diet, and then bland solids as you are able. Please adhere to the enclosed dietary suggestions.  In general, avoid NSAIDs (i.e. ibuprofen, naproxen, meloxicam, etc.), caffeine, alcohol, spicy foods, fatty foods, or any other foods that seem to cause your symptoms to arise.  Protonix: Take this medication daily, 20-30 minutes prior to your first meal, for the next 2 weeks.  Continue to take this medication even if you begin to feel better.  Carafate: This medication is meant to reduce symptoms of pain and discomfort.  Follow-up: Please follow-up with your primary care provider and the gastroenterologist on this matter.  Return: Return to the ED for significantly worsening symptoms, persistent vomiting, persistent fever, vomiting blood, blood in the stools, dark stools, or any other major concerns.  For prescription assistance, may try using prescription discount sites or apps, such as goodrx.com

## 2018-10-31 NOTE — ED Triage Notes (Signed)
Pt with abdominal pain, pt's family reports pt had been constipated x several days but was treated with mag citrate, OTC remedies and suppositories and pt has had several bowel movements over the past several days. Pt continues with abdominal pain. Pt's family reports stool is dark.

## 2018-10-31 NOTE — ED Provider Notes (Signed)
Medical screening examination/treatment/procedure(s) were conducted as a shared visit with non-physician practitioner(s) and myself.  I personally evaluated the patient during the encounter. Briefly, the patient is a 84 y.o. female is a 84 year old female with history of diabetes, high cholesterol who presents the ED with abdominal pain, constipation.  Patient with normal vitals.  No fever.  Patient with constipation for the last several days despite multiple treatments with MiraLAX and other stool softeners.  Patient has had no desire to eat or drink.  She has some mild tenderness on exam.  But she is overall well-appearing.  Patient appears mildly dehydrated on exam.  Lab work shows mild AKI.  CT scan showed possible duodenitis likely inflammatory versus infectious.  However patient with no fever, no white count.  Patient was given IV fluids and felt improved.  Had shared decision with the family about possible admission for hydration, antibiotics but they would like to be discharged if able to tolerate p.o.  Will treat with Protonix, Carafate for gastritis.  Will give Cipro and Flagyl for possible infectious duodenitis.  Given return precautions and recommend close follow-up with primary care doctor and discharged in ED good condition.  This chart was dictated using voice recognition software.  Despite best efforts to proofread,  errors can occur which can change the documentation meaning.    EKG Interpretation None           Virgina NorfolkCuratolo, Rheagan Nayak, DO 10/31/18 1651

## 2018-10-31 NOTE — ED Notes (Signed)
Pt drank one cup of water and had three packs of crackers for the Fluid/PO challenge. Pt reports no nausea, and no episodes of emesis.

## 2018-10-31 NOTE — ED Notes (Signed)
Patient transported to CT 

## 2018-11-05 ENCOUNTER — Other Ambulatory Visit: Payer: Self-pay | Admitting: Gastroenterology

## 2018-11-05 DIAGNOSIS — D649 Anemia, unspecified: Secondary | ICD-10-CM | POA: Diagnosis not present

## 2018-11-05 DIAGNOSIS — R935 Abnormal findings on diagnostic imaging of other abdominal regions, including retroperitoneum: Secondary | ICD-10-CM

## 2018-11-05 DIAGNOSIS — K921 Melena: Secondary | ICD-10-CM | POA: Diagnosis not present

## 2018-11-06 ENCOUNTER — Other Ambulatory Visit: Payer: Self-pay | Admitting: Cardiology

## 2018-11-18 DIAGNOSIS — M5136 Other intervertebral disc degeneration, lumbar region: Secondary | ICD-10-CM | POA: Diagnosis not present

## 2018-11-18 DIAGNOSIS — M47816 Spondylosis without myelopathy or radiculopathy, lumbar region: Secondary | ICD-10-CM | POA: Diagnosis not present

## 2018-11-18 DIAGNOSIS — M4150 Other secondary scoliosis, site unspecified: Secondary | ICD-10-CM | POA: Diagnosis not present

## 2018-11-18 DIAGNOSIS — M545 Low back pain: Secondary | ICD-10-CM | POA: Diagnosis not present

## 2018-11-25 ENCOUNTER — Ambulatory Visit
Admission: RE | Admit: 2018-11-25 | Discharge: 2018-11-25 | Disposition: A | Discharge status: Home / Self Care | Payer: PPO | Source: Ambulatory Visit | Attending: Gastroenterology | Admitting: Gastroenterology

## 2018-11-25 ENCOUNTER — Other Ambulatory Visit: Payer: PPO

## 2018-11-25 ENCOUNTER — Other Ambulatory Visit: Payer: Self-pay | Admitting: Gastroenterology

## 2018-11-25 DIAGNOSIS — R935 Abnormal findings on diagnostic imaging of other abdominal regions, including retroperitoneum: Secondary | ICD-10-CM

## 2018-11-25 DIAGNOSIS — K225 Diverticulum of esophagus, acquired: Secondary | ICD-10-CM | POA: Diagnosis not present

## 2019-01-07 IMAGING — CT CT HIP*R* W/O CM
2 of 3 series · 17 of 46 positions shown, 19 images · non-contrast
Comparison: Plain films right hip 04/04/2017.

CLINICAL DATA: Right hip pain due to a fall yesterday. Pubic rami
fractures by comparison plain films. Initial encounter.

EXAM:
CT OF THE RIGHT HIP WITHOUT CONTRAST
TECHNIQUE: Multidetector CT imaging of the right hip was performed according to
the standard protocol. Multiplanar CT image reconstructions were
also generated.

[Series 5: axial st · axial · 0.38mm/px · z∈[-216,-82]mm · 14 of 79 slices shown, 16 images]
[im 6/79  soft-tissue]
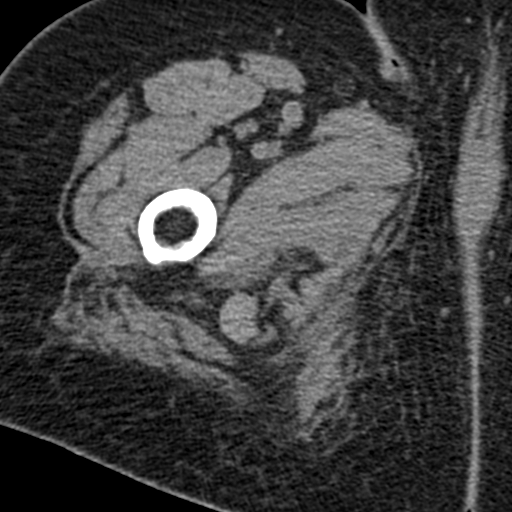
[im 6/79  bone]
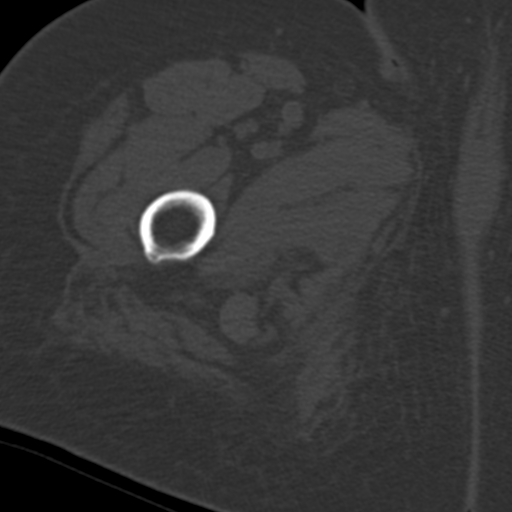
[im 11/79  soft-tissue]
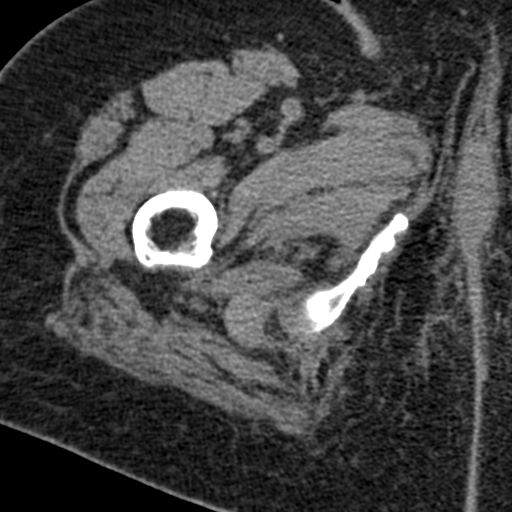
[im 16/79  soft-tissue]
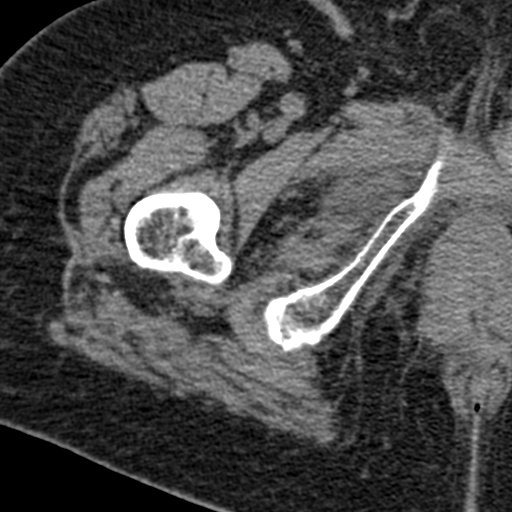
[im 21/79  soft-tissue]
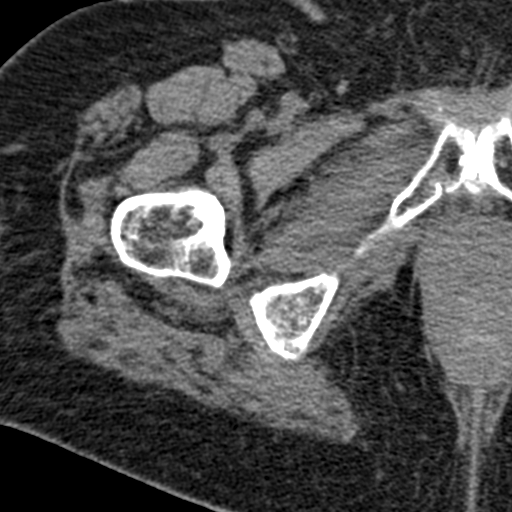
[im 26/79  soft-tissue]
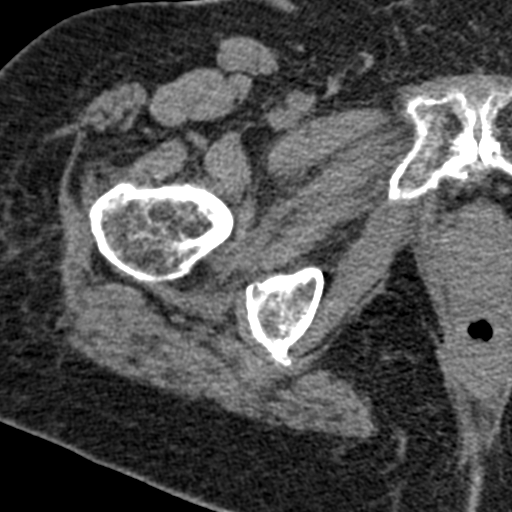
[im 31/79  soft-tissue]
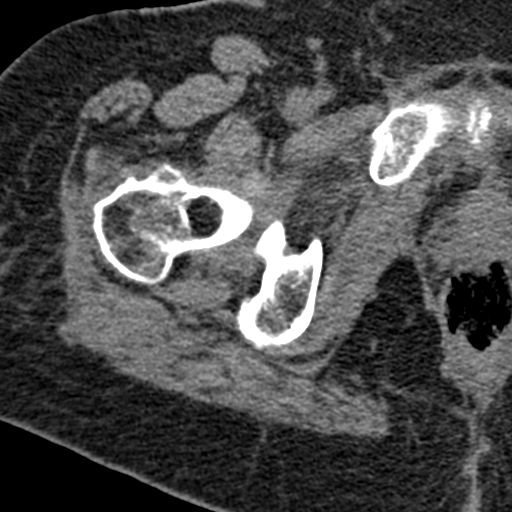
[im 36/79  soft-tissue]
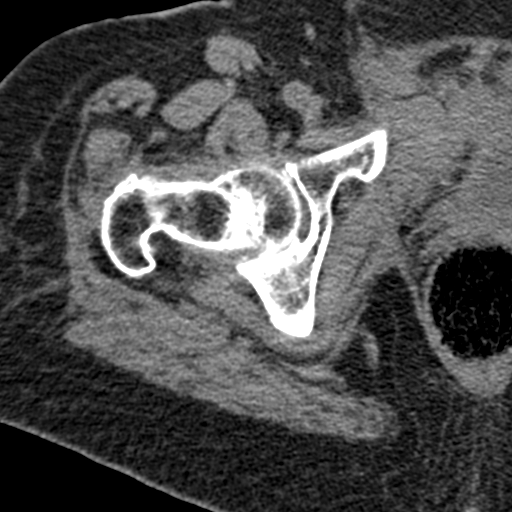
[im 43/79  soft-tissue]
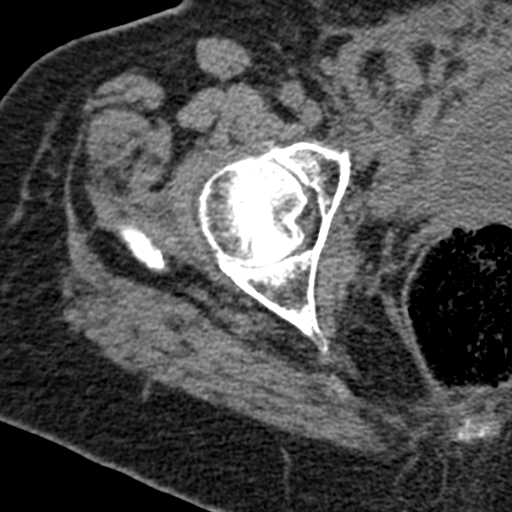
[im 48/79  soft-tissue]
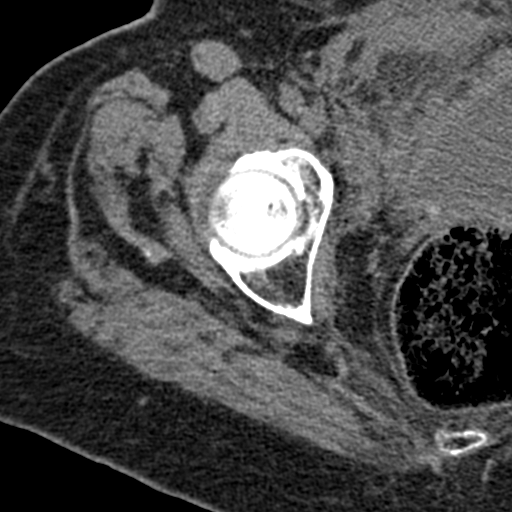
[im 48/79  bone]
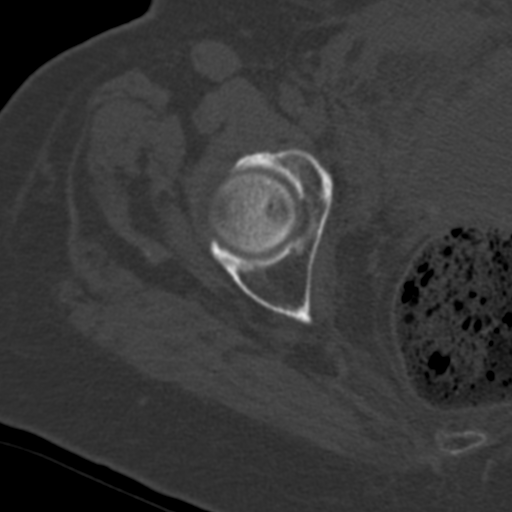
[im 53/79  soft-tissue]
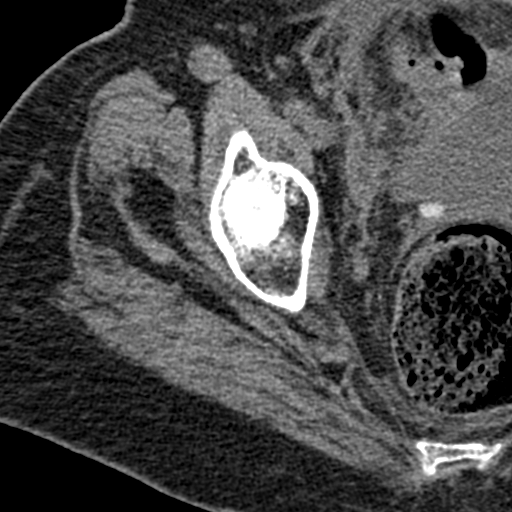
[im 58/79  soft-tissue]
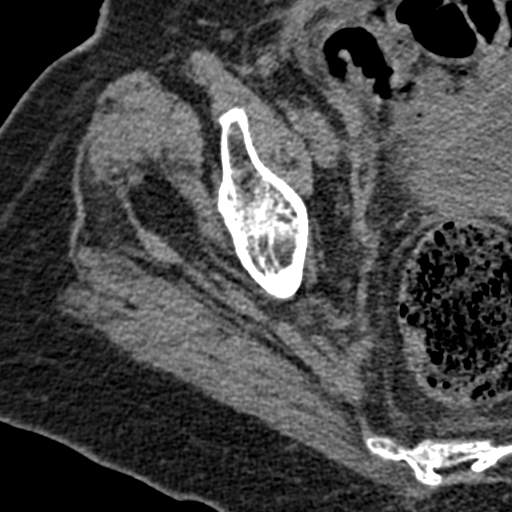
[im 63/79  soft-tissue]
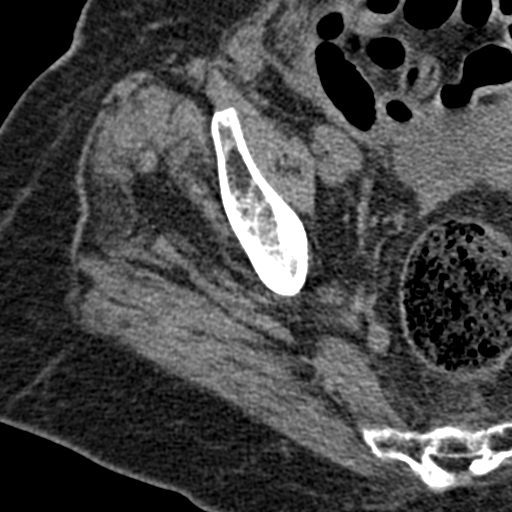
[im 68/79  soft-tissue]
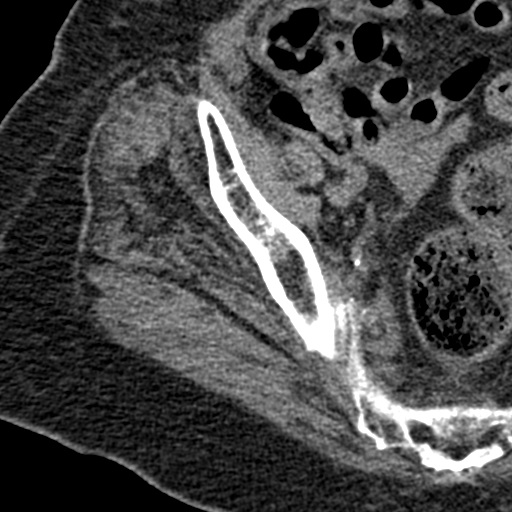
[im 73/79  soft-tissue]
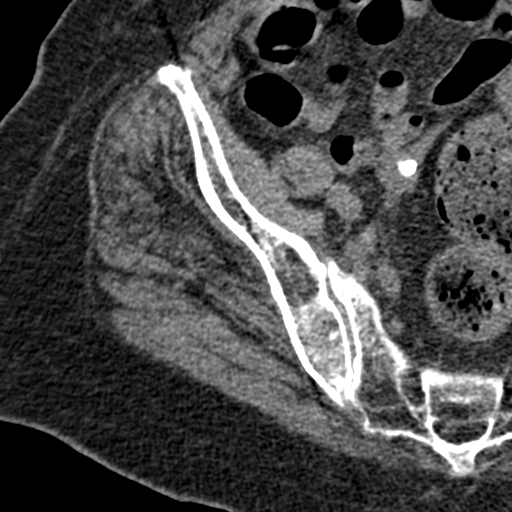

[Series 10: coronal st · coronal · 0.33mm/px · 3 of 95 slices shown]
[im 32/95  soft-tissue]
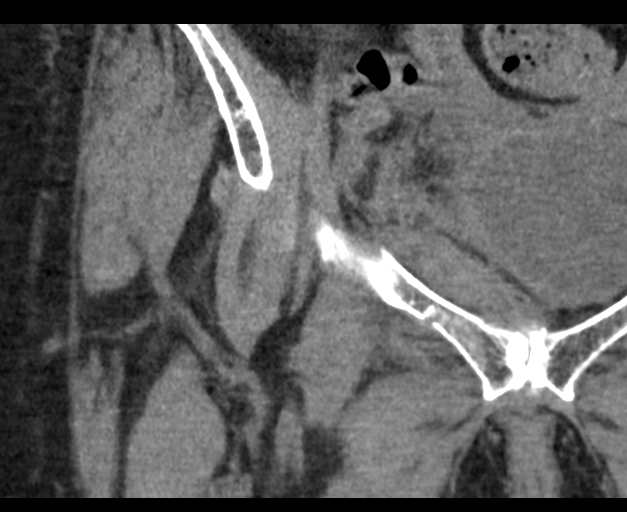
[im 42/95  soft-tissue]
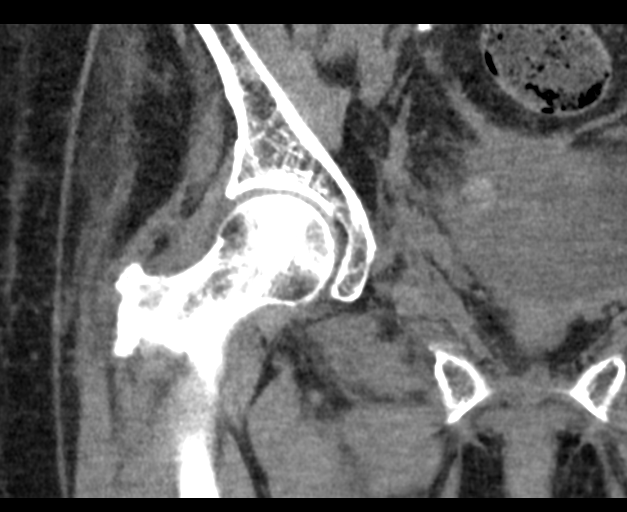
[im 53/95  soft-tissue]
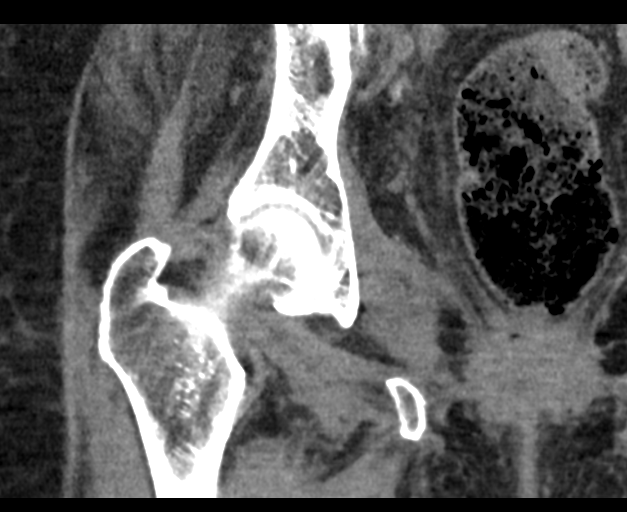

[17 of 46 positions shown; findings below may reference images not displayed]

FINDINGS: Bones/Joint/Cartilage

There is partial visualization of a nondisplaced fracture of the
lateral aspect of the right sacrum. The patient has nondisplaced
segmental fractures through the mid and inferior aspect of the right
superior pubic ramus. Nondisplaced right inferior pubic ramus
fracture is also identified. The right hip is located and no hip
fracture is identified.

Ligaments

Suboptimally assessed by CT.

Muscles and Tendons

Intact.

Soft tissues

No right hip effusion. No acute intrapelvic abnormality. Prominent
stool in the rectosigmoid colon is noted.
IMPRESSION: Nondisplaced fractures of the periphery of the right sacrum and the
right superior and inferior pubic rami.

Negative for hip fracture.

Prominent rectosigmoid stool burden.

## 2019-01-12 DIAGNOSIS — R935 Abnormal findings on diagnostic imaging of other abdominal regions, including retroperitoneum: Secondary | ICD-10-CM | POA: Diagnosis not present

## 2019-01-12 DIAGNOSIS — K921 Melena: Secondary | ICD-10-CM | POA: Diagnosis not present

## 2019-01-13 DIAGNOSIS — M5136 Other intervertebral disc degeneration, lumbar region: Secondary | ICD-10-CM | POA: Diagnosis not present

## 2019-02-11 ENCOUNTER — Other Ambulatory Visit: Payer: Self-pay | Admitting: Cardiology

## 2019-02-11 DIAGNOSIS — M4150 Other secondary scoliosis, site unspecified: Secondary | ICD-10-CM | POA: Diagnosis not present

## 2019-02-11 DIAGNOSIS — I471 Supraventricular tachycardia: Secondary | ICD-10-CM | POA: Diagnosis not present

## 2019-02-11 DIAGNOSIS — M5136 Other intervertebral disc degeneration, lumbar region: Secondary | ICD-10-CM | POA: Diagnosis not present

## 2019-02-26 DIAGNOSIS — R531 Weakness: Secondary | ICD-10-CM | POA: Diagnosis not present

## 2019-02-26 DIAGNOSIS — R4182 Altered mental status, unspecified: Secondary | ICD-10-CM | POA: Diagnosis not present

## 2019-03-09 DIAGNOSIS — R531 Weakness: Secondary | ICD-10-CM | POA: Diagnosis not present

## 2019-03-09 DIAGNOSIS — G8929 Other chronic pain: Secondary | ICD-10-CM | POA: Diagnosis not present

## 2019-03-09 DIAGNOSIS — K219 Gastro-esophageal reflux disease without esophagitis: Secondary | ICD-10-CM | POA: Diagnosis not present

## 2019-03-09 DIAGNOSIS — D72829 Elevated white blood cell count, unspecified: Secondary | ICD-10-CM | POA: Diagnosis not present

## 2019-03-09 DIAGNOSIS — R4182 Altered mental status, unspecified: Secondary | ICD-10-CM | POA: Diagnosis not present

## 2019-03-09 DIAGNOSIS — I5032 Chronic diastolic (congestive) heart failure: Secondary | ICD-10-CM | POA: Diagnosis not present

## 2019-03-09 DIAGNOSIS — M545 Low back pain: Secondary | ICD-10-CM | POA: Diagnosis not present

## 2019-03-09 DIAGNOSIS — R8290 Unspecified abnormal findings in urine: Secondary | ICD-10-CM | POA: Diagnosis not present

## 2019-03-11 ENCOUNTER — Other Ambulatory Visit: Payer: Self-pay | Admitting: Family Medicine

## 2019-03-11 DIAGNOSIS — R4182 Altered mental status, unspecified: Secondary | ICD-10-CM

## 2019-03-11 DIAGNOSIS — R531 Weakness: Secondary | ICD-10-CM

## 2019-03-11 DIAGNOSIS — M5136 Other intervertebral disc degeneration, lumbar region: Secondary | ICD-10-CM | POA: Diagnosis not present

## 2019-03-16 ENCOUNTER — Other Ambulatory Visit: Payer: PPO

## 2019-03-16 ENCOUNTER — Other Ambulatory Visit: Payer: Self-pay

## 2019-03-16 ENCOUNTER — Telehealth: Payer: Self-pay | Admitting: Cardiology

## 2019-03-16 ENCOUNTER — Ambulatory Visit
Admission: RE | Admit: 2019-03-16 | Discharge: 2019-03-16 | Disposition: A | Discharge status: Home / Self Care | Payer: PPO | Source: Ambulatory Visit | Attending: Family Medicine | Admitting: Family Medicine

## 2019-03-16 DIAGNOSIS — R531 Weakness: Secondary | ICD-10-CM

## 2019-03-16 DIAGNOSIS — R4182 Altered mental status, unspecified: Secondary | ICD-10-CM

## 2019-03-16 MED ORDER — GADOBENATE DIMEGLUMINE 529 MG/ML IV SOLN
7.0000 mL | Freq: Once | INTRAVENOUS | Status: AC | PRN
  Administered 2019-03-16: 13:00:00 7 mL via INTRAVENOUS

## 2019-03-16 NOTE — Telephone Encounter (Signed)
Virtual Visit Pre-Appointment Phone Call  "(Name), I am calling you today to discuss your upcoming appointment. We are currently trying to limit exposure to the virus that causes COVID-19 by seeing patients at home rather than in the office."  1. "What is the BEST phone number to call the day of the visit?" - include this in appointment notes  2. Do you have or have access to (through a family member/friend) a smartphone with video capability that we can use for your visit?" a. If yes - list this number in appt notes as cell (if different from BEST phone #) and list the appointment type as a VIDEO visit in appointment notes b. If no - list the appointment type as a PHONE visit in appointment notes  3. Confirm consent - "In the setting of the current Covid19 crisis, you are scheduled for a (phone or video) visit with your provider on (date) at (time).  Just as we do with many in-office visits, in order for you to participate in this visit, we must obtain consent.  If you'd like, I can send this to your mychart (if signed up) or email for you to review.  Otherwise, I can obtain your verbal consent now.  All virtual visits are billed to your insurance company just like a normal visit would be.  By agreeing to a virtual visit, we'd like you to understand that the technology does not allow for your provider to perform an examination, and thus may limit your provider's ability to fully assess your condition. If your provider identifies any concerns that need to be evaluated in person, we will make arrangements to do so.  Finally, though the technology is pretty good, we cannot assure that it will always work on either your or our end, and in the setting of a video visit, we may have to convert it to a phone-only visit.  In either situation, we cannot ensure that we have a secure connection.  Are you willing to proceed?" YES  4. Advise patient to be prepared - "Two hours prior to your appointment, go  ahead and check your blood pressure, pulse, oxygen saturation, and your weight (if you have the equipment to check those) and write them all down. When your visit starts, your provider will ask you for this information. If you have an Apple Watch or Kardia device, please plan to have heart rate information ready on the day of your appointment. Please have a pen and paper handy nearby the day of the visit as well."  5. Give patient instructions for MyChart download to smartphone OR Doximity/Doxy.me as below if video visit (depending on what platform provider is using)  6. Inform patient they will receive a phone call 15 minutes prior to their appointment time (may be from unknown caller ID) so they should be prepared to answer    TELEPHONE CALL NOTE  Stephanie Jenkins has been deemed a candidate for a follow-up tele-health visit to limit community exposure during the Covid-19 pandemic. I spoke with the patient via phone to ensure availability of phone/video source, confirm preferred email & phone number, and discuss instructions and expectations.  I reminded Stephanie Jenkins to be prepared with any vital sign and/or heart rhythm information that could potentially be obtained via home monitoring, at the time of her visit. I reminded Stephanie Jenkins to expect a phone call prior to her visit.  Scherrie Bateman 03/16/2019 1:30 PM   INSTRUCTIONS FOR DOWNLOADING  THE MYCHART APP TO SMARTPHONE  - The patient must first make sure to have activated MyChart and know their login information - If Apple, go to [employer] and type in MyChart in the search bar and download the app. If Android, ask patient to go to [employer] and type in [place] in the search bar and download the app. The app is free but as with any other app downloads, their phone may require them to verify saved payment information or Apple/Android password.  - The patient will need to then log into the app with their MyChart username and  password, and select [facility] as their healthcare provider to link the account. When it is time for your visit, go to the MyChart app, find appointments, and click Begin Video Visit. Be sure to Select Allow for your device to access the Microphone and Camera for your visit. You will then be connected, and your provider will be with you shortly.  **If they have any issues connecting, or need assistance please contact MyChart service desk [phone])**  **If using a computer, in order to ensure the best quality for their visit they will need to use either of the following Internet Browsers: [employer], or Google Chrome**  IF USING DOXIMITY or DOXY.ME - The patient will receive a link just prior to their visit by text.     FULL LENGTH CONSENT FOR TELE-HEALTH VISIT   I hereby voluntarily request, consent and authorize [facility] and its employed or contracted physicians, physician assistants, nurse practitioners or other licensed health care professionals (the Practitioner), to provide me with telemedicine health care services (the Services") as deemed necessary by the treating Practitioner. I acknowledge and consent to receive the Services by the Practitioner via telemedicine. I understand that the telemedicine visit will involve communicating with the Practitioner through live audiovisual communication technology and the disclosure of certain medical information by electronic transmission. I acknowledge that I have been given the opportunity to request an in-person assessment or other available alternative prior to the telemedicine visit and am voluntarily participating in the telemedicine visit.  I understand that I have the right to withhold or withdraw my consent to the use of telemedicine in the course of my care at any time, without affecting my right to future care or treatment, and that the Practitioner or I may terminate the telemedicine visit at any time. I  understand that I have the right to inspect all information obtained and/or recorded in the course of the telemedicine visit and may receive copies of available information for a reasonable fee.  I understand that some of the potential risks of receiving the Services via telemedicine include:   Delay or interruption in medical evaluation due to technological equipment failure or disruption;  Information transmitted may not be sufficient (e.g. poor resolution of images) to allow for appropriate medical decision making by the Practitioner; and/or   In rare instances, security protocols could fail, causing a breach of personal health information.  Furthermore, I acknowledge that it is my responsibility to provide information about my medical history, conditions and care that is complete and accurate to the best of my ability. I acknowledge that Practitioner's advice, recommendations, and/or decision may be based on factors not within their control, such as incomplete or inaccurate data provided by me or distortions of diagnostic images or specimens that may result from electronic transmissions. I understand that the practice of medicine is not an exact science and that Practitioner  makes no warranties or guarantees regarding treatment outcomes. I acknowledge that I will receive a copy of this consent concurrently upon execution via email to the email address I last provided but may also request a printed copy by calling the office of [facility].    I understand that my insurance will be billed for this visit.   I have read or had this consent read to me.  I understand the contents of this consent, which adequately explains the benefits and risks of the Services being provided via telemedicine.   I have been provided ample opportunity to ask questions regarding this consent and the Services and have had my questions answered to my satisfaction.  I give my informed consent for the services to be  provided through the use of telemedicine in my medical care  By participating in this telemedicine visit I agree to the above.

## 2019-03-17 NOTE — Progress Notes (Signed)
Virtual Visit via Video Note   This visit type was conducted due to national recommendations for restrictions regarding the COVID-19 Pandemic (e.g. social distancing) in an effort to limit this patient's exposure and mitigate transmission in our community.  Due to her co-morbid illnesses, this patient is at least at moderate risk for complications without adequate follow up.  This format is felt to be most appropriate for this patient at this time.  All issues noted in this document were discussed and addressed.  A limited physical exam was performed with this format.  Please refer to the patient's chart for her consent to telehealth for [facility].   Evaluation Performed:  Follow-up visit  This visit type was conducted due to national recommendations for restrictions regarding the COVID-19 Pandemic (e.g. social distancing).  This format is felt to be most appropriate for this patient at this time.  All issues noted in this document were discussed and addressed.  No physical exam was performed (except for noted visual exam findings with Video Visits).  Please refer to the patient's chart (MyChart message for video visits and phone note for telephone visits) for the patient's consent to telehealth for [facility].  Date:  03/18/2019   ID:  Stephanie Jenkins, DOB [birth year], MRN [id number]  Patient Location:  Home  Provider location:   [place]  PCP:  Stephanie Jenkins., PA-C  Cardiologist:  Stephanie Magic, MD Electrophysiologist:  None   Chief Complaint:  CHF  History of Present Illness:    Stephanie Jenkins is a 85 y.o. female who presents via audio/video conferencing for a telehealth visit today.    Stephanie Jenkins is a 85 y.o. female with a hx of LE edema due to chronic diastolic CHF, DM type 2 and hyperlipidemia.  She is here today for followup and is doing well.  She recently had a UTI and got very confused and her daughter says that she has not been the same since then.  SHe is  less mobile and since she has not been walking around as much her legs have been swelling more due to sitting and having her legs more dependent.  She has started back on the compression hose.  She denies any chest pain or pressure, SOB, DOE, PND, orthopnea, dizziness (except when she stands up too fast), palpitations or syncope. She is compliant with her meds and is tolerating meds with no SE.    The patient does not have symptoms concerning for COVID-19 infection (fever, chills, cough, or new shortness of breath).   Prior CV studies:   The following studies were reviewed today:  none  Past Medical History:  Diagnosis Date  . Carotid bruit    bilateral  . Chronic diastolic CHF (congestive heart failure) (HCC)   . Closed pelvic fracture (HCC) 05/05/2017  . DEGENERATIVE JOINT DISEASE 07/28/2008  . DIABETES MELLITUS, TYPE II, CONTROLLED 07/27/2007  . HYPERCHOLESTEROLEMIA, MILD 07/27/2007  . Impaired glucose tolerance 08/07/2012  . Leg edema   . OSTEOPOROSIS 07/14/2007  . Pelvic fracture (HCC) 04/04/2017   Past Surgical History:  Procedure Laterality Date  . ABDOMINAL HYSTERECTOMY    . CATARACT EXTRACTION    . TONSILLECTOMY       Current Meds  Medication Sig  . Ascorbic Acid (VITAMIN C PO) Take by mouth as directed.  Marland Kitchen aspirin 81 MG tablet Take 162 mg by mouth at bedtime.   . B Complex-C (SUPER B COMPLEX PO) Take by mouth as directed.  Marland Kitchen  Chlorpheniramine Maleate (ALLERGY PO) Take 1 tablet by mouth daily.  . cholecalciferol (VITAMIN D) 400 units TABS tablet Take 800 Units by mouth daily.  . clopidogrel (PLAVIX) 75 MG tablet Take 75 mg by mouth daily.  . Ferrous Sulfate (IRON PO) Take by mouth as directed.  . furosemide (LASIX) 20 MG tablet TAKE 1 TABLET BY MOUTH ONCE DAILY. PLEASE MAKE ANNUAL APPOINTMENT FOR FURTHER REFILLS  . Glucosamine-Chondroit-Vit C-Mn (GLUCOSAMINE CHONDR 1500 COMPLX PO) Take 1 tablet by mouth 2 (two) times daily.   . methocarbamol (ROBAXIN) 500 MG tablet Take 1  tablet (500 mg total) by mouth every 8 (eight) hours as needed for muscle spasms. (Patient taking differently: Take 500 mg by mouth 2 (two) times daily. )  . Multiple Vitamin (MULTIVITAMIN) tablet Take 1 tablet by mouth daily.    . pantoprazole (PROTONIX) 20 MG tablet Take 1 tablet (20 mg total) by mouth daily for 14 days.  . polyethylene glycol (MIRALAX / GLYCOLAX) packet Take 17 g by mouth 2 (two) times daily. (Patient taking differently: Take 17 g by mouth daily. )  . sertraline (ZOLOFT) 50 MG tablet Take 75 mg by mouth daily.  . traMADol (ULTRAM) 50 MG tablet Take 1 tablet (50 mg total) by mouth every 6 (six) hours as needed. (Patient taking differently: Take 50 mg by mouth every 6 (six) hours as needed. 100 mg in the am, 50 mg at lunch and 50 in the pm)  . TURMERIC PO Take 1 capsule by mouth daily.     Allergies:   Hydrocodone-acetaminophen; Hydrocodone-acetaminophen; and Oxycodone   Social History   Tobacco Use  . Smoking status: Never Smoker  . Smokeless tobacco: Former Neurosurgeon    Types: Chew  Substance Use Topics  . Alcohol use: No  . Drug use: No     Family Hx: The patient's family history includes Hypertension in an other family member.  ROS:   Please see the history of present illness.     All other systems reviewed and are negative.   Labs/Other Tests and Data Reviewed:    Recent Labs: 10/31/2018: ALT 14; BUN 22; Creatinine, Ser 1.47; Hemoglobin 10.6; Platelets 317; Potassium 4.6; Sodium 135   Recent Lipid Panel Lab Results  Component Value Date/Time   CHOL 189 08/14/2015 09:41 AM   TRIG 79.0 08/14/2015 09:41 AM   HDL 64.80 08/14/2015 09:41 AM   CHOLHDL 3 08/14/2015 09:41 AM   LDLCALC 109 (H) 08/14/2015 09:41 AM    Wt Readings from Last 3 Encounters:  03/18/19 155 lb (70.3 kg)  12/22/17 151 lb 9.6 oz (68.8 kg)  11/18/17 155 lb (70.3 kg)     Objective:    Vital Signs:  BP 119/61   Pulse 100   Temp (!) 96.5 F (35.8 C)   Ht 4\' 8"  (1.422 m)   Wt 155 lb  (70.3 kg)   BMI 34.75 kg/m    CONSTITUTIONAL:  Well nourished, well developed female in no acute distress.  EYES: anicteric MOUTH: oral mucosa is pink RESPIRATORY: Normal respiratory effort, symmetric expansion CARDIOVASCULAR: No peripheral edema SKIN: No rash, lesions or ulcers MUSCULOSKELETAL: no digital cyanosis NEURO: Cranial Nerves II-XII grossly intact, moves all extremities PSYCH: Intact judgement and insight.  A&O x 3, Mood/affect appropriate   ASSESSMENT & PLAN:    1.  Chronic diastolic CHF - she has not had any SOB but does have chronic LE edema.  Her weight is stable.  She will continue on diuretics.   2.  Hyperlipidemia - she is currently not on statins.   3.  Chronic LE edema - she did have increased LE edema after a UTI which kept her fairly immobile for a while and so she was not walking around much.  Her LE edema is usually  well controlled on lasix 20mg  daily.  Her daughter says that edema is worse when she does not walk around. She has started back using the compression hose.  Her creatinine was 1.47 10/2018 and this is being followed by her PCP.   4.  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  She is no on any DM meds at this time.  5.  COVID-19 Education:The signs and symptoms of COVID-19 were discussed with the patient and how to seek care for testing (follow up with PCP or arrange E-visit).  The importance of social distancing was discussed today.  Patient Risk:   After full review of this patient's clinical status, I feel that they are at least moderate risk at this time.  Time:   Today, I have spent 15 minutes directly with the patient on video discussing medical problems including CHF, lipids and LE edema.  We also reviewed the symptoms of COVID 19 and the ways to protect against contracting the virus with telehealth technology.  I spent an additional 5 minutes reviewing patient's chart including labs and OV notes.  Medication Adjustments/Labs  and Tests Ordered: Current medicines are reviewed at length with the patient today.  Concerns regarding medicines are outlined above.  Tests Ordered: No orders of the defined types were placed in this encounter.  Medication Changes: No orders of the defined types were placed in this encounter.   Disposition:  Follow up with me in 6 months  Signed, Stephanie Magicraci Rosann Gorum, MD  03/18/2019 9:18 AM    [facility]

## 2019-03-18 ENCOUNTER — Other Ambulatory Visit: Payer: Self-pay

## 2019-03-18 ENCOUNTER — Telehealth (INDEPENDENT_AMBULATORY_CARE_PROVIDER_SITE_OTHER): Payer: PPO | Admitting: Cardiology

## 2019-03-18 ENCOUNTER — Encounter: Payer: Self-pay | Admitting: Cardiology

## 2019-03-18 VITALS — BP 119/61 | HR 100 | Temp 96.5°F | Ht <= 58 in | Wt 155.0 lb

## 2019-03-18 DIAGNOSIS — E1122 Type 2 diabetes mellitus with diabetic chronic kidney disease: Secondary | ICD-10-CM | POA: Diagnosis not present

## 2019-03-18 DIAGNOSIS — N183 Chronic kidney disease, stage 3 (moderate): Secondary | ICD-10-CM | POA: Diagnosis not present

## 2019-03-18 DIAGNOSIS — R600 Localized edema: Secondary | ICD-10-CM

## 2019-03-18 DIAGNOSIS — E7800 Pure hypercholesterolemia, unspecified: Secondary | ICD-10-CM | POA: Diagnosis not present

## 2019-03-18 DIAGNOSIS — I5032 Chronic diastolic (congestive) heart failure: Secondary | ICD-10-CM | POA: Diagnosis not present

## 2019-03-18 DIAGNOSIS — Z7189 Other specified counseling: Secondary | ICD-10-CM

## 2019-03-18 NOTE — Patient Instructions (Signed)
Medication Instructions:  Your physician recommends that you continue on your current medications as directed. Please refer to the Current Medication list given to you today.  If you need a refill on your cardiac medications before your next appointment, please call your pharmacy.   Lab work: None Ordered  If you have labs (blood work) drawn today and your tests are completely normal, you will receive your results only by: . MyChart Message (if you have MyChart) OR . A paper copy in the mail If you have any lab test that is abnormal or we need to change your treatment, we will call you to review the results.  Testing/Procedures: None ordered  Follow-Up: At [facility], you and your health needs are our priority.  As part of our continuing mission to provide you with exceptional heart care, we have created designated Provider Care Teams.  These Care Teams include your primary Cardiologist (physician) and Advanced Practice Providers (APPs -  Physician Assistants and Nurse Practitioners) who all work together to provide you with the care you need, when you need it. . You will need a follow up appointment in 6 months.  Please call our office 2 months in advance to schedule this appointment.  You may see Traci Turner, MD or one of the following Advanced Practice Providers on your designated Care Team:   . Brittainy Simmons, PA-C . Dayna Dunn, PA-C . Michele Lenze, PA-C  Any Other Special Instructions Will Be Listed Below (If Applicable).    

## 2019-04-08 DIAGNOSIS — M5136 Other intervertebral disc degeneration, lumbar region: Secondary | ICD-10-CM | POA: Diagnosis not present

## 2019-05-05 DIAGNOSIS — R8290 Unspecified abnormal findings in urine: Secondary | ICD-10-CM | POA: Diagnosis not present

## 2019-05-11 DIAGNOSIS — M47816 Spondylosis without myelopathy or radiculopathy, lumbar region: Secondary | ICD-10-CM | POA: Diagnosis not present

## 2019-05-11 DIAGNOSIS — M4150 Other secondary scoliosis, site unspecified: Secondary | ICD-10-CM | POA: Diagnosis not present

## 2019-06-01 DIAGNOSIS — R300 Dysuria: Secondary | ICD-10-CM | POA: Diagnosis not present

## 2019-06-11 DIAGNOSIS — M4150 Other secondary scoliosis, site unspecified: Secondary | ICD-10-CM | POA: Diagnosis not present

## 2019-06-11 DIAGNOSIS — M5136 Other intervertebral disc degeneration, lumbar region: Secondary | ICD-10-CM | POA: Diagnosis not present

## 2019-06-11 DIAGNOSIS — M47816 Spondylosis without myelopathy or radiculopathy, lumbar region: Secondary | ICD-10-CM | POA: Diagnosis not present

## 2019-07-07 DIAGNOSIS — M5136 Other intervertebral disc degeneration, lumbar region: Secondary | ICD-10-CM | POA: Diagnosis not present

## 2019-07-07 DIAGNOSIS — M4150 Other secondary scoliosis, site unspecified: Secondary | ICD-10-CM | POA: Diagnosis not present

## 2019-07-07 DIAGNOSIS — R300 Dysuria: Secondary | ICD-10-CM | POA: Diagnosis not present

## 2019-07-20 DIAGNOSIS — M4150 Other secondary scoliosis, site unspecified: Secondary | ICD-10-CM | POA: Diagnosis not present

## 2019-07-20 DIAGNOSIS — M7918 Myalgia, other site: Secondary | ICD-10-CM | POA: Diagnosis not present

## 2019-07-20 DIAGNOSIS — M5136 Other intervertebral disc degeneration, lumbar region: Secondary | ICD-10-CM | POA: Diagnosis not present

## 2019-08-04 DIAGNOSIS — M5136 Other intervertebral disc degeneration, lumbar region: Secondary | ICD-10-CM | POA: Diagnosis not present

## 2019-08-25 DIAGNOSIS — M5136 Other intervertebral disc degeneration, lumbar region: Secondary | ICD-10-CM | POA: Diagnosis not present

## 2019-08-25 DIAGNOSIS — M4150 Other secondary scoliosis, site unspecified: Secondary | ICD-10-CM | POA: Diagnosis not present

## 2019-09-08 DIAGNOSIS — F0390 Unspecified dementia without behavioral disturbance: Secondary | ICD-10-CM | POA: Diagnosis not present

## 2019-09-08 DIAGNOSIS — K219 Gastro-esophageal reflux disease without esophagitis: Secondary | ICD-10-CM | POA: Diagnosis not present

## 2019-09-08 DIAGNOSIS — R739 Hyperglycemia, unspecified: Secondary | ICD-10-CM | POA: Diagnosis not present

## 2019-09-08 DIAGNOSIS — G8929 Other chronic pain: Secondary | ICD-10-CM | POA: Diagnosis not present

## 2019-09-08 DIAGNOSIS — Z23 Encounter for immunization: Secondary | ICD-10-CM | POA: Diagnosis not present

## 2019-09-08 DIAGNOSIS — Z0000 Encounter for general adult medical examination without abnormal findings: Secondary | ICD-10-CM | POA: Diagnosis not present

## 2019-09-08 DIAGNOSIS — D649 Anemia, unspecified: Secondary | ICD-10-CM | POA: Diagnosis not present

## 2019-09-08 DIAGNOSIS — I5032 Chronic diastolic (congestive) heart failure: Secondary | ICD-10-CM | POA: Diagnosis not present

## 2019-09-08 DIAGNOSIS — M545 Low back pain: Secondary | ICD-10-CM | POA: Diagnosis not present

## 2019-09-08 DIAGNOSIS — F3341 Major depressive disorder, recurrent, in partial remission: Secondary | ICD-10-CM | POA: Diagnosis not present

## 2019-09-08 DIAGNOSIS — D72829 Elevated white blood cell count, unspecified: Secondary | ICD-10-CM | POA: Diagnosis not present

## 2019-09-16 ENCOUNTER — Other Ambulatory Visit: Payer: Self-pay

## 2019-09-16 ENCOUNTER — Telehealth (INDEPENDENT_AMBULATORY_CARE_PROVIDER_SITE_OTHER): Payer: PPO | Admitting: Cardiology

## 2019-09-16 ENCOUNTER — Encounter: Payer: Self-pay | Admitting: Cardiology

## 2019-09-16 VITALS — BP 116/74 | HR 81 | Ht <= 58 in | Wt 161.0 lb

## 2019-09-16 DIAGNOSIS — N183 Chronic kidney disease, stage 3 unspecified: Secondary | ICD-10-CM

## 2019-09-16 DIAGNOSIS — E1122 Type 2 diabetes mellitus with diabetic chronic kidney disease: Secondary | ICD-10-CM

## 2019-09-16 DIAGNOSIS — R600 Localized edema: Secondary | ICD-10-CM | POA: Diagnosis not present

## 2019-09-16 DIAGNOSIS — I5032 Chronic diastolic (congestive) heart failure: Secondary | ICD-10-CM | POA: Diagnosis not present

## 2019-09-16 DIAGNOSIS — E7800 Pure hypercholesterolemia, unspecified: Secondary | ICD-10-CM | POA: Diagnosis not present

## 2019-09-16 NOTE — Patient Instructions (Signed)
Medication Instructions:  Your physician recommends that you continue on your current medications as directed. Please refer to the Current Medication list given to you today.  *If you need a refill on your cardiac medications before your next appointment, please call your pharmacy*  Lab Work: None-  We will request recent lab work from your primary care doctor. If you have labs (blood work) drawn today and your tests are completely normal, you will receive your results only by: Marland Kitchen MyChart Message (if you have MyChart) OR . A paper copy in the mail If you have any lab test that is abnormal or we need to change your treatment, we will call you to review the results.  Testing/Procedures: none  Follow-Up: At [facility], you and your health needs are our priority.  As part of our continuing mission to provide you with exceptional heart care, we have created designated Provider Care Teams.  These Care Teams include your primary Cardiologist (physician) and Advanced Practice Providers (APPs -  Physician Assistants and Nurse Practitioners) who all work together to provide you with the care you need, when you need it.  Your next appointment:   12 months  The format for your next appointment:   In Person  Provider:   You may see Dr Radford Pax or one of the following Advanced Practice Providers on your designated Care Team:    Melina Copa, PA-C  Ermalinda Barrios, PA-C   Other Instructions

## 2019-09-16 NOTE — Progress Notes (Signed)
Virtual Visit via Telephone Note   This visit type was conducted due to national recommendations for restrictions regarding the COVID-19 Pandemic (e.g. social distancing) in an effort to limit this patient's exposure and mitigate transmission in our community.  Due to her co-morbid illnesses, this patient is at least at moderate risk for complications without adequate follow up.  This format is felt to be most appropriate for this patient at this time.  All issues noted in this document were discussed and addressed.  A limited physical exam was performed with this format.  Ple  Evaluation Performed:  Follow-up visit  This visit type was conducted due to national recommendations for restrictions regarding the COVID-19 Pandemic (e.g. social distancing).  This format is felt to be most appropriate for this patient at this time.  All issues noted in this document were discussed and addressed.  No physical exam was performed (except for noted visual exam findings with Video Visits).  Please refer to the patient's chart (MyChart message for video visits and phone note for telephone visits) for the patient's consent to telehealth for [facility].  Date:  09/16/2019   ID:  Stephanie Jenkins, DOB 30-Jan-1923, MRN [id number]  Patient Location:  Home  Provider location:   [place]  PCP:  Richmond Campbell., PA-C  Cardiologist:  Armanda Magic, MD Electrophysiologist:  None   Chief Complaint:  CHF  History of Present Illness:    Stephanie Jenkins is a 85 y.o. female who presents via audio/video conferencing for a telehealth visit today.    Stephanie Fehr Coleis a 20 y.o.femalewith a hx of LE edema due to chronic diastolic CHF and sedentary state, DM type 2 and hyperlipidemia. She is here today for followup and is doing well.  She has chronic LE edema due to sedentary state and chronic venous insuff which is controlled on compression hose and elevation of legs. She denies any chest pain or pressure, SOB,  DOE, PND, orthopnea, dizziness, palpitations or syncope. She is compliant with her meds and is tolerating meds with no SE.    The patient does not have symptoms concerning for COVID-19 infection (fever, chills, cough, or new shortness of breath).   Prior CV studies:   The following studies were reviewed today:  none  Past Medical History:  Diagnosis Date  . Carotid bruit    bilateral  . Chronic diastolic CHF (congestive heart failure) (HCC)   . Closed pelvic fracture (HCC) 05/05/2017  . DEGENERATIVE JOINT DISEASE 07/28/2008  . DIABETES MELLITUS, TYPE II, CONTROLLED 07/27/2007  . HYPERCHOLESTEROLEMIA, MILD 07/27/2007  . Impaired glucose tolerance 08/07/2012  . Leg edema   . OSTEOPOROSIS 07/14/2007  . Pelvic fracture (HCC) 04/04/2017   Past Surgical History:  Procedure Laterality Date  . ABDOMINAL HYSTERECTOMY    . CATARACT EXTRACTION    . TONSILLECTOMY       Current Meds  Medication Sig  . Ascorbic Acid (VITAMIN C PO) Take by mouth as directed.  Marland Kitchen aspirin 81 MG tablet Take 162 mg by mouth at bedtime.   . B Complex-C (SUPER B COMPLEX PO) Take by mouth as directed.  . Chlorpheniramine Maleate (ALLERGY PO) Take 1 tablet by mouth daily.  . cholecalciferol (VITAMIN D) 400 units TABS tablet Take 800 Units by mouth daily.  . clopidogrel (PLAVIX) 75 MG tablet Take 75 mg by mouth daily.  Marland Kitchen donepezil (ARICEPT) 10 MG tablet Take 10 mg by mouth at bedtime.  . fentaNYL (DURAGESIC) 25 MCG/HR Place 1 patch  onto the skin every 3 (three) days.  . Ferrous Sulfate (IRON PO) Take by mouth as directed.  . furosemide (LASIX) 20 MG tablet TAKE 1 TABLET BY MOUTH ONCE DAILY. PLEASE MAKE ANNUAL APPOINTMENT FOR FURTHER REFILLS  . Glucosamine-Chondroit-Vit C-Mn (GLUCOSAMINE CHONDR 1500 COMPLX PO) Take 1 tablet by mouth 2 (two) times daily.   . methocarbamol (ROBAXIN) 500 MG tablet Take 1 tablet (500 mg total) by mouth every 8 (eight) hours as needed for muscle spasms. (Patient taking differently: Take 500 mg by  mouth 2 (two) times daily. )  . Multiple Vitamin (MULTIVITAMIN) tablet Take 1 tablet by mouth daily.    Marland Kitchen oxyCODONE-acetaminophen (PERCOCET/ROXICET) 5-325 MG tablet Take 1 tablet by mouth 2 (two) times daily.  . pantoprazole (PROTONIX) 20 MG tablet Take 1 tablet (20 mg total) by mouth daily for 14 days.  . polyethylene glycol (MIRALAX / GLYCOLAX) packet Take 17 g by mouth 2 (two) times daily. (Patient taking differently: Take 17 g by mouth daily. )  . sertraline (ZOLOFT) 50 MG tablet Take 75 mg by mouth daily.  . TURMERIC PO Take 1 capsule by mouth daily.     Allergies:   Hydrocodone-acetaminophen, Hydrocodone-acetaminophen, and Oxycodone   Social History   Tobacco Use  . Smoking status: Never Smoker  . Smokeless tobacco: Former Systems developer    Types: Chew  Substance Use Topics  . Alcohol use: No  . Drug use: No     Family Hx: The patient's family history includes Hypertension in an other family member.  ROS:   Please see the history of present illness.     All other systems reviewed and are negative.   Labs/Other Tests and Data Reviewed:    Recent Labs: 10/31/2018: ALT 14; BUN 22; Creatinine, Ser 1.47; Hemoglobin 10.6; Platelets 317; Potassium 4.6; Sodium 135   Recent Lipid Panel Lab Results  Component Value Date/Time   CHOL 189 08/14/2015 09:41 AM   TRIG 79.0 08/14/2015 09:41 AM   HDL 64.80 08/14/2015 09:41 AM   CHOLHDL 3 08/14/2015 09:41 AM   LDLCALC 109 (H) 08/14/2015 09:41 AM    Wt Readings from Last 3 Encounters:  09/16/19 161 lb (73 kg)  03/18/19 155 lb (70.3 kg)  12/22/17 151 lb 9.6 oz (68.8 kg)     Objective:    Vital Signs:  BP 116/74   Pulse 81   Ht 4\' 8"  (1.422 m)   Wt 161 lb (73 kg)   BMI 36.10 kg/m     ASSESSMENT & PLAN:    1.  Chronic diastolic CHF -she denies any SOB -LE edema controlled -continue Lasix 20mg  daily -check BMET  2.  Chronic LE edema -this seems to be controlled with diuretics and compression hose  3.  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  Hyperlipidemia -currently not on statins  COVID-19 Education: The signs and symptoms of COVID-19 were discussed with the patient and how to seek care for testing (follow up with PCP or arrange E-visit).  The importance of social distancing was discussed today.  Patient Risk:   After full review of this patient's clinical status, I feel that they are at least moderate risk at this time.  Time:   Today, I have spent 20 minutes directly with the patient on telemedicine discussing medical problems including CHF, Lipids, DM with CKD, LE edema.  We also reviewed the symptoms of COVID 19 and the ways to protect against contracting the virus  with telehealth technology.  I spent an additional 5 minutes reviewing patient's chart including labs.  Medication Adjustments/Labs and Tests Ordered: Current medicines are reviewed at length with the patient today.  Concerns regarding medicines are outlined above.  Tests Ordered: No orders of the defined types were placed in this encounter.  Medication Changes: No orders of the defined types were placed in this encounter.   Disposition:  Follow up in 1 year(s)  Signed, Armanda Magicraci Shireen Rayburn, MD  09/16/2019 8:09 AM    [facility]

## 2019-09-22 DIAGNOSIS — M4150 Other secondary scoliosis, site unspecified: Secondary | ICD-10-CM | POA: Diagnosis not present

## 2019-09-22 DIAGNOSIS — M5136 Other intervertebral disc degeneration, lumbar region: Secondary | ICD-10-CM | POA: Diagnosis not present

## 2019-09-22 DIAGNOSIS — G894 Chronic pain syndrome: Secondary | ICD-10-CM | POA: Diagnosis not present

## 2019-10-21 ENCOUNTER — Telehealth: Payer: PPO | Admitting: Cardiology

## 2019-11-09 DIAGNOSIS — M5136 Other intervertebral disc degeneration, lumbar region: Secondary | ICD-10-CM | POA: Diagnosis not present

## 2019-11-09 DIAGNOSIS — G894 Chronic pain syndrome: Secondary | ICD-10-CM | POA: Diagnosis not present

## 2019-11-09 DIAGNOSIS — M4150 Other secondary scoliosis, site unspecified: Secondary | ICD-10-CM | POA: Diagnosis not present

## 2019-11-13 ENCOUNTER — Ambulatory Visit: Payer: PPO

## 2019-11-15 ENCOUNTER — Ambulatory Visit: Payer: Medicare Other | Attending: Internal Medicine

## 2019-11-15 DIAGNOSIS — Z23 Encounter for immunization: Secondary | ICD-10-CM | POA: Insufficient documentation

## 2019-11-15 NOTE — Progress Notes (Signed)
   Covid-19 Vaccination Clinic  Name:  Stephanie Jenkins    MRN: [id number] DOB: Sep 12, 1923  11/15/2019  Stephanie Jenkins was observed post Covid-19 immunization for 15 minutes without incidence. She was provided with Vaccine Information Sheet and instruction to access the V-Safe system.   Stephanie Jenkins was instructed to call 911 with any severe reactions post vaccine: Marland Kitchen Difficulty breathing  . Swelling of your face and throat  . A fast heartbeat  . A bad rash all over your body  . Dizziness and weakness    Immunizations Administered    Name Date Dose VIS Date Route   Pfizer COVID-19 Vaccine 11/15/2019 11:35 AM 0.3 mL 10/15/2019 Intramuscular   Manufacturer: [employer], [employer]   Lot: [id number]   NDC: [address]-4

## 2019-12-02 DIAGNOSIS — G894 Chronic pain syndrome: Secondary | ICD-10-CM | POA: Diagnosis not present

## 2019-12-02 DIAGNOSIS — M4150 Other secondary scoliosis, site unspecified: Secondary | ICD-10-CM | POA: Diagnosis not present

## 2019-12-02 DIAGNOSIS — M5136 Other intervertebral disc degeneration, lumbar region: Secondary | ICD-10-CM | POA: Diagnosis not present

## 2019-12-03 ENCOUNTER — Ambulatory Visit: Payer: PPO

## 2019-12-04 ENCOUNTER — Ambulatory Visit: Payer: PPO | Attending: Internal Medicine

## 2019-12-04 DIAGNOSIS — Z23 Encounter for immunization: Secondary | ICD-10-CM | POA: Insufficient documentation

## 2019-12-04 NOTE — Progress Notes (Signed)
   Covid-19 Vaccination Clinic  Name:  Stephanie Jenkins    MRN: [id number] DOB: 16-Mar-1923  12/04/2019  [name] was observed post Covid-19 immunization for 15 minutes without incidence. She was provided with Vaccine Information Sheet and instruction to access the V-Safe system.   [name] was instructed to call 911 with any severe reactions post vaccine: Marland Kitchen Difficulty breathing  . Swelling of your face and throat  . A fast heartbeat  . A bad rash all over your body  . Dizziness and weakness    Immunizations Administered    Name Date Dose VIS Date Route   Pfizer COVID-19 Vaccine 12/04/2019 10:10 AM 0.3 mL 10/15/2019 Intramuscular   Manufacturer: [employer], [employer]   Lot: [id number]   NDC: [address]-2

## 2019-12-20 DIAGNOSIS — M5136 Other intervertebral disc degeneration, lumbar region: Secondary | ICD-10-CM | POA: Diagnosis not present

## 2020-01-03 DIAGNOSIS — M5136 Other intervertebral disc degeneration, lumbar region: Secondary | ICD-10-CM | POA: Diagnosis not present

## 2020-01-03 DIAGNOSIS — G894 Chronic pain syndrome: Secondary | ICD-10-CM | POA: Diagnosis not present

## 2020-01-03 DIAGNOSIS — M4150 Other secondary scoliosis, site unspecified: Secondary | ICD-10-CM | POA: Diagnosis not present

## 2020-01-21 ENCOUNTER — Other Ambulatory Visit: Payer: Self-pay | Admitting: Cardiology

## 2020-02-07 DIAGNOSIS — G894 Chronic pain syndrome: Secondary | ICD-10-CM | POA: Diagnosis not present

## 2020-02-07 DIAGNOSIS — M5136 Other intervertebral disc degeneration, lumbar region: Secondary | ICD-10-CM | POA: Diagnosis not present

## 2020-02-07 DIAGNOSIS — M4150 Other secondary scoliosis, site unspecified: Secondary | ICD-10-CM | POA: Diagnosis not present

## 2020-02-29 DIAGNOSIS — M1711 Unilateral primary osteoarthritis, right knee: Secondary | ICD-10-CM | POA: Diagnosis not present

## 2020-02-29 DIAGNOSIS — M545 Low back pain: Secondary | ICD-10-CM | POA: Diagnosis not present

## 2020-03-06 DIAGNOSIS — G894 Chronic pain syndrome: Secondary | ICD-10-CM | POA: Diagnosis not present

## 2020-03-06 DIAGNOSIS — M5136 Other intervertebral disc degeneration, lumbar region: Secondary | ICD-10-CM | POA: Diagnosis not present

## 2020-03-06 DIAGNOSIS — M4150 Other secondary scoliosis, site unspecified: Secondary | ICD-10-CM | POA: Diagnosis not present

## 2020-03-07 DIAGNOSIS — D649 Anemia, unspecified: Secondary | ICD-10-CM | POA: Diagnosis not present

## 2020-03-07 DIAGNOSIS — G8929 Other chronic pain: Secondary | ICD-10-CM | POA: Diagnosis not present

## 2020-03-07 DIAGNOSIS — F3341 Major depressive disorder, recurrent, in partial remission: Secondary | ICD-10-CM | POA: Diagnosis not present

## 2020-03-07 DIAGNOSIS — K219 Gastro-esophageal reflux disease without esophagitis: Secondary | ICD-10-CM | POA: Diagnosis not present

## 2020-03-07 DIAGNOSIS — M545 Low back pain: Secondary | ICD-10-CM | POA: Diagnosis not present

## 2020-03-07 DIAGNOSIS — R739 Hyperglycemia, unspecified: Secondary | ICD-10-CM | POA: Diagnosis not present

## 2020-03-07 DIAGNOSIS — I5032 Chronic diastolic (congestive) heart failure: Secondary | ICD-10-CM | POA: Diagnosis not present

## 2020-03-07 DIAGNOSIS — L720 Epidermal cyst: Secondary | ICD-10-CM | POA: Diagnosis not present

## 2020-03-07 DIAGNOSIS — F0390 Unspecified dementia without behavioral disturbance: Secondary | ICD-10-CM | POA: Diagnosis not present

## 2020-03-08 DIAGNOSIS — F419 Anxiety disorder, unspecified: Secondary | ICD-10-CM | POA: Diagnosis not present

## 2020-03-08 DIAGNOSIS — F3341 Major depressive disorder, recurrent, in partial remission: Secondary | ICD-10-CM | POA: Diagnosis not present

## 2020-03-08 DIAGNOSIS — G894 Chronic pain syndrome: Secondary | ICD-10-CM | POA: Diagnosis not present

## 2020-03-10 ENCOUNTER — Other Ambulatory Visit: Payer: Self-pay | Admitting: Orthopaedic Surgery

## 2020-03-10 DIAGNOSIS — M546 Pain in thoracic spine: Secondary | ICD-10-CM

## 2020-03-10 DIAGNOSIS — M5136 Other intervertebral disc degeneration, lumbar region: Secondary | ICD-10-CM

## 2020-03-31 DIAGNOSIS — M25522 Pain in left elbow: Secondary | ICD-10-CM | POA: Diagnosis not present

## 2020-04-06 DIAGNOSIS — M545 Low back pain: Secondary | ICD-10-CM | POA: Diagnosis not present

## 2020-04-06 DIAGNOSIS — M5416 Radiculopathy, lumbar region: Secondary | ICD-10-CM | POA: Diagnosis not present

## 2020-04-10 ENCOUNTER — Ambulatory Visit
Admission: RE | Admit: 2020-04-10 | Discharge: 2020-04-10 | Disposition: A | Discharge status: Home / Self Care | Payer: PPO | Source: Ambulatory Visit | Attending: Orthopaedic Surgery | Admitting: Orthopaedic Surgery

## 2020-04-10 ENCOUNTER — Other Ambulatory Visit: Payer: Self-pay

## 2020-04-10 DIAGNOSIS — M5136 Other intervertebral disc degeneration, lumbar region: Secondary | ICD-10-CM

## 2020-04-10 DIAGNOSIS — M5124 Other intervertebral disc displacement, thoracic region: Secondary | ICD-10-CM | POA: Diagnosis not present

## 2020-04-10 DIAGNOSIS — M546 Pain in thoracic spine: Secondary | ICD-10-CM

## 2020-04-10 DIAGNOSIS — M4804 Spinal stenosis, thoracic region: Secondary | ICD-10-CM | POA: Diagnosis not present

## 2020-04-10 DIAGNOSIS — M48061 Spinal stenosis, lumbar region without neurogenic claudication: Secondary | ICD-10-CM | POA: Diagnosis not present

## 2020-04-19 DIAGNOSIS — M4150 Other secondary scoliosis, site unspecified: Secondary | ICD-10-CM | POA: Diagnosis not present

## 2020-04-19 DIAGNOSIS — M5136 Other intervertebral disc degeneration, lumbar region: Secondary | ICD-10-CM | POA: Diagnosis not present

## 2020-04-19 DIAGNOSIS — G894 Chronic pain syndrome: Secondary | ICD-10-CM | POA: Diagnosis not present

## 2020-04-20 DIAGNOSIS — R8290 Unspecified abnormal findings in urine: Secondary | ICD-10-CM | POA: Diagnosis not present

## 2020-05-11 DIAGNOSIS — M545 Low back pain: Secondary | ICD-10-CM | POA: Diagnosis not present

## 2020-05-11 DIAGNOSIS — G8929 Other chronic pain: Secondary | ICD-10-CM | POA: Diagnosis not present

## 2020-06-01 DIAGNOSIS — M5136 Other intervertebral disc degeneration, lumbar region: Secondary | ICD-10-CM | POA: Diagnosis not present

## 2020-06-01 DIAGNOSIS — G894 Chronic pain syndrome: Secondary | ICD-10-CM | POA: Diagnosis not present

## 2020-06-01 DIAGNOSIS — M4150 Other secondary scoliosis, site unspecified: Secondary | ICD-10-CM | POA: Diagnosis not present

## 2020-06-29 DIAGNOSIS — Z79891 Long term (current) use of opiate analgesic: Secondary | ICD-10-CM | POA: Diagnosis not present

## 2020-06-29 DIAGNOSIS — G894 Chronic pain syndrome: Secondary | ICD-10-CM | POA: Diagnosis not present

## 2020-06-29 DIAGNOSIS — M4150 Other secondary scoliosis, site unspecified: Secondary | ICD-10-CM | POA: Diagnosis not present

## 2020-06-29 DIAGNOSIS — Z79899 Other long term (current) drug therapy: Secondary | ICD-10-CM | POA: Diagnosis not present

## 2020-06-29 DIAGNOSIS — M5136 Other intervertebral disc degeneration, lumbar region: Secondary | ICD-10-CM | POA: Diagnosis not present

## 2020-07-12 DIAGNOSIS — G8929 Other chronic pain: Secondary | ICD-10-CM | POA: Diagnosis not present

## 2020-07-12 DIAGNOSIS — Z5309 Procedure and treatment not carried out because of other contraindication: Secondary | ICD-10-CM | POA: Diagnosis not present

## 2020-07-12 DIAGNOSIS — M5416 Radiculopathy, lumbar region: Secondary | ICD-10-CM | POA: Diagnosis not present

## 2020-07-13 DIAGNOSIS — R8290 Unspecified abnormal findings in urine: Secondary | ICD-10-CM | POA: Diagnosis not present

## 2020-07-19 DIAGNOSIS — Z885 Allergy status to narcotic agent status: Secondary | ICD-10-CM | POA: Diagnosis not present

## 2020-07-19 DIAGNOSIS — Z4542 Encounter for adjustment and management of neuropacemaker (brain) (peripheral nerve) (spinal cord): Secondary | ICD-10-CM | POA: Diagnosis not present

## 2020-07-19 DIAGNOSIS — M545 Low back pain: Secondary | ICD-10-CM | POA: Diagnosis not present

## 2020-07-19 DIAGNOSIS — M48061 Spinal stenosis, lumbar region without neurogenic claudication: Secondary | ICD-10-CM | POA: Diagnosis not present

## 2020-07-19 DIAGNOSIS — M5136 Other intervertebral disc degeneration, lumbar region: Secondary | ICD-10-CM | POA: Diagnosis not present

## 2020-07-19 DIAGNOSIS — Z9689 Presence of other specified functional implants: Secondary | ICD-10-CM | POA: Diagnosis not present

## 2020-07-27 DIAGNOSIS — G894 Chronic pain syndrome: Secondary | ICD-10-CM | POA: Diagnosis not present

## 2020-07-27 DIAGNOSIS — M4150 Other secondary scoliosis, site unspecified: Secondary | ICD-10-CM | POA: Diagnosis not present

## 2020-07-27 DIAGNOSIS — M5136 Other intervertebral disc degeneration, lumbar region: Secondary | ICD-10-CM | POA: Diagnosis not present

## 2020-08-21 DIAGNOSIS — M4150 Other secondary scoliosis, site unspecified: Secondary | ICD-10-CM | POA: Diagnosis not present

## 2020-08-21 DIAGNOSIS — M5136 Other intervertebral disc degeneration, lumbar region: Secondary | ICD-10-CM | POA: Diagnosis not present

## 2020-08-21 DIAGNOSIS — G894 Chronic pain syndrome: Secondary | ICD-10-CM | POA: Diagnosis not present

## 2020-09-08 DIAGNOSIS — M4150 Other secondary scoliosis, site unspecified: Secondary | ICD-10-CM | POA: Diagnosis not present

## 2020-09-08 DIAGNOSIS — G894 Chronic pain syndrome: Secondary | ICD-10-CM | POA: Diagnosis not present

## 2020-09-08 DIAGNOSIS — M545 Low back pain, unspecified: Secondary | ICD-10-CM | POA: Diagnosis not present

## 2020-09-08 DIAGNOSIS — M25561 Pain in right knee: Secondary | ICD-10-CM | POA: Diagnosis not present

## 2020-09-08 DIAGNOSIS — M5136 Other intervertebral disc degeneration, lumbar region: Secondary | ICD-10-CM | POA: Diagnosis not present

## 2020-09-08 DIAGNOSIS — Z6831 Body mass index (BMI) 31.0-31.9, adult: Secondary | ICD-10-CM | POA: Diagnosis not present

## 2020-09-11 DIAGNOSIS — R739 Hyperglycemia, unspecified: Secondary | ICD-10-CM | POA: Diagnosis not present

## 2020-09-11 DIAGNOSIS — I5032 Chronic diastolic (congestive) heart failure: Secondary | ICD-10-CM | POA: Diagnosis not present

## 2020-09-11 DIAGNOSIS — N1832 Chronic kidney disease, stage 3b: Secondary | ICD-10-CM | POA: Diagnosis not present

## 2020-09-11 DIAGNOSIS — R399 Unspecified symptoms and signs involving the genitourinary system: Secondary | ICD-10-CM | POA: Diagnosis not present

## 2020-09-11 DIAGNOSIS — R531 Weakness: Secondary | ICD-10-CM | POA: Diagnosis not present

## 2020-09-12 DIAGNOSIS — I517 Cardiomegaly: Secondary | ICD-10-CM | POA: Diagnosis not present

## 2020-09-21 DIAGNOSIS — M5136 Other intervertebral disc degeneration, lumbar region: Secondary | ICD-10-CM | POA: Diagnosis not present

## 2020-09-21 DIAGNOSIS — M4150 Other secondary scoliosis, site unspecified: Secondary | ICD-10-CM | POA: Diagnosis not present

## 2020-09-27 DIAGNOSIS — L570 Actinic keratosis: Secondary | ICD-10-CM | POA: Diagnosis not present

## 2020-10-12 ENCOUNTER — Emergency Department (HOSPITAL_COMMUNITY): Payer: PPO

## 2020-10-12 ENCOUNTER — Inpatient Hospital Stay (HOSPITAL_COMMUNITY): Payer: PPO

## 2020-10-12 ENCOUNTER — Encounter (HOSPITAL_COMMUNITY): Payer: Self-pay | Admitting: Emergency Medicine

## 2020-10-12 ENCOUNTER — Observation Stay (HOSPITAL_COMMUNITY)
Admission: EM | Admit: 2020-10-12 | Discharge: 2020-10-13 | Disposition: A | Discharge status: Home / Self Care | Payer: PPO | Attending: Internal Medicine | Admitting: Internal Medicine

## 2020-10-12 DIAGNOSIS — M4186 Other forms of scoliosis, lumbar region: Secondary | ICD-10-CM | POA: Diagnosis not present

## 2020-10-12 DIAGNOSIS — M545 Low back pain, unspecified: Secondary | ICD-10-CM

## 2020-10-12 DIAGNOSIS — G319 Degenerative disease of nervous system, unspecified: Secondary | ICD-10-CM | POA: Diagnosis not present

## 2020-10-12 DIAGNOSIS — G8929 Other chronic pain: Secondary | ICD-10-CM

## 2020-10-12 DIAGNOSIS — I5032 Chronic diastolic (congestive) heart failure: Secondary | ICD-10-CM | POA: Diagnosis not present

## 2020-10-12 DIAGNOSIS — R109 Unspecified abdominal pain: Secondary | ICD-10-CM | POA: Diagnosis not present

## 2020-10-12 DIAGNOSIS — R404 Transient alteration of awareness: Secondary | ICD-10-CM | POA: Diagnosis not present

## 2020-10-12 DIAGNOSIS — M549 Dorsalgia, unspecified: Secondary | ICD-10-CM | POA: Diagnosis not present

## 2020-10-12 DIAGNOSIS — M1990 Unspecified osteoarthritis, unspecified site: Secondary | ICD-10-CM | POA: Diagnosis not present

## 2020-10-12 DIAGNOSIS — G9389 Other specified disorders of brain: Secondary | ICD-10-CM | POA: Diagnosis not present

## 2020-10-12 DIAGNOSIS — R29810 Facial weakness: Secondary | ICD-10-CM | POA: Diagnosis not present

## 2020-10-12 DIAGNOSIS — I959 Hypotension, unspecified: Secondary | ICD-10-CM | POA: Diagnosis not present

## 2020-10-12 DIAGNOSIS — E119 Type 2 diabetes mellitus without complications: Secondary | ICD-10-CM

## 2020-10-12 DIAGNOSIS — R400 Somnolence: Secondary | ICD-10-CM | POA: Diagnosis not present

## 2020-10-12 DIAGNOSIS — K838 Other specified diseases of biliary tract: Secondary | ICD-10-CM | POA: Diagnosis not present

## 2020-10-12 DIAGNOSIS — J811 Chronic pulmonary edema: Secondary | ICD-10-CM | POA: Diagnosis not present

## 2020-10-12 DIAGNOSIS — R4182 Altered mental status, unspecified: Principal | ICD-10-CM | POA: Diagnosis present

## 2020-10-12 DIAGNOSIS — M810 Age-related osteoporosis without current pathological fracture: Secondary | ICD-10-CM | POA: Diagnosis present

## 2020-10-12 DIAGNOSIS — R262 Difficulty in walking, not elsewhere classified: Secondary | ICD-10-CM | POA: Insufficient documentation

## 2020-10-12 DIAGNOSIS — R9431 Abnormal electrocardiogram [ECG] [EKG]: Secondary | ICD-10-CM | POA: Diagnosis not present

## 2020-10-12 DIAGNOSIS — R52 Pain, unspecified: Secondary | ICD-10-CM | POA: Diagnosis not present

## 2020-10-12 DIAGNOSIS — Z20822 Contact with and (suspected) exposure to covid-19: Secondary | ICD-10-CM | POA: Diagnosis not present

## 2020-10-12 DIAGNOSIS — R531 Weakness: Secondary | ICD-10-CM | POA: Diagnosis not present

## 2020-10-12 DIAGNOSIS — K8689 Other specified diseases of pancreas: Secondary | ICD-10-CM | POA: Diagnosis not present

## 2020-10-12 DIAGNOSIS — Z90710 Acquired absence of both cervix and uterus: Secondary | ICD-10-CM | POA: Diagnosis not present

## 2020-10-12 DIAGNOSIS — I700 Atherosclerosis of aorta: Secondary | ICD-10-CM | POA: Diagnosis not present

## 2020-10-12 DIAGNOSIS — I10 Essential (primary) hypertension: Secondary | ICD-10-CM | POA: Diagnosis not present

## 2020-10-12 LAB — URINALYSIS, ROUTINE W REFLEX MICROSCOPIC
Bilirubin Urine: NEGATIVE
Glucose, UA: NEGATIVE mg/dL
Hgb urine dipstick: NEGATIVE
Ketones, ur: NEGATIVE mg/dL
Leukocytes,Ua: NEGATIVE
Nitrite: NEGATIVE
Protein, ur: NEGATIVE mg/dL
Specific Gravity, Urine: 1.017 (ref 1.005–1.030)
pH: 5 (ref 5.0–8.0)

## 2020-10-12 LAB — BASIC METABOLIC PANEL
Anion gap: 12 (ref 5–15)
BUN: 18 mg/dL (ref 8–23)
CO2: 24 mmol/L (ref 22–32)
Calcium: 9.3 mg/dL (ref 8.9–10.3)
Chloride: 102 mmol/L (ref 98–111)
Creatinine, Ser: 1.47 mg/dL — ABNORMAL HIGH (ref 0.44–1.00)
GFR, Estimated: 33 mL/min — ABNORMAL LOW (ref >60)
Glucose, Bld: 115 mg/dL — ABNORMAL HIGH (ref 70–99)
Potassium: 4.3 mmol/L (ref 3.5–5.1)
Sodium: 138 mmol/L (ref 135–145)

## 2020-10-12 LAB — CBC WITH DIFFERENTIAL/PLATELET
Abs Immature Granulocytes: 0.03 10*3/uL (ref 0.00–0.07)
Basophils Absolute: 0.1 10*3/uL (ref 0.0–0.1)
Basophils Relative: 1 %
Eosinophils Absolute: 0.3 10*3/uL (ref 0.0–0.5)
Eosinophils Relative: 3 %
HCT: 36.7 % (ref 36.0–46.0)
Hemoglobin: 11.5 g/dL — ABNORMAL LOW (ref 12.0–15.0)
Immature Granulocytes: 0 %
Lymphocytes Relative: 28 %
Lymphs Abs: 2.3 10*3/uL (ref 0.7–4.0)
MCH: 32.4 pg (ref 26.0–34.0)
MCHC: 31.3 g/dL (ref 30.0–36.0)
MCV: 103.4 fL — ABNORMAL HIGH (ref 80.0–100.0)
Monocytes Absolute: 0.7 10*3/uL (ref 0.1–1.0)
Monocytes Relative: 9 %
Neutro Abs: 5 10*3/uL (ref 1.7–7.7)
Neutrophils Relative %: 59 %
Platelets: 251 10*3/uL (ref 150–400)
RBC: 3.55 MIL/uL — ABNORMAL LOW (ref 3.87–5.11)
RDW: 13.2 % (ref 11.5–15.5)
WBC: 8.4 10*3/uL (ref 4.0–10.5)
nRBC: 0 % (ref 0.0–0.2)

## 2020-10-12 LAB — RESP PANEL BY RT-PCR (FLU A&B, COVID) ARPGX2
Influenza A by PCR: NEGATIVE
Influenza B by PCR: NEGATIVE
SARS Coronavirus 2 by RT PCR: NEGATIVE

## 2020-10-12 LAB — APTT: aPTT: 27 seconds (ref 24–36)

## 2020-10-12 LAB — TROPONIN I (HIGH SENSITIVITY)
Troponin I (High Sensitivity): 9 ng/L (ref <18)
Troponin I (High Sensitivity): 9 ng/L (ref <18)

## 2020-10-12 LAB — HEPATIC FUNCTION PANEL
ALT: 14 U/L (ref 0–44)
AST: 21 U/L (ref 15–41)
Albumin: 3.9 g/dL (ref 3.5–5.0)
Alkaline Phosphatase: 59 U/L (ref 38–126)
Bilirubin, Direct: <0.1 mg/dL (ref 0.0–0.2)
Total Bilirubin: 0.7 mg/dL (ref 0.3–1.2)
Total Protein: 6.5 g/dL (ref 6.5–8.1)

## 2020-10-12 LAB — PROTIME-INR
INR: 1 (ref 0.8–1.2)
Prothrombin Time: 12.6 seconds (ref 11.4–15.2)

## 2020-10-12 LAB — POC OCCULT BLOOD, ED: Fecal Occult Bld: POSITIVE — AB

## 2020-10-12 LAB — CBG MONITORING, ED: Glucose-Capillary: 99 mg/dL (ref 70–99)

## 2020-10-12 MED ORDER — FENTANYL 25 MCG/HR TD PT72: 1.0000 | MEDICATED_PATCH | TRANSDERMAL | Status: DC

## 2020-10-12 MED ORDER — ASPIRIN EC 81 MG PO TBEC: 162.0000 mg | DELAYED_RELEASE_TABLET | Freq: Every day | ORAL | Status: DC

## 2020-10-12 MED ORDER — OXYCODONE-ACETAMINOPHEN 5-325 MG PO TABS
1.0000 | ORAL_TABLET | Freq: Every day | ORAL | Status: DC
  Administered 2020-10-12: 1 via ORAL
  Filled 2020-10-12: qty 1

## 2020-10-12 MED ORDER — FENTANYL CITRATE (PF) 100 MCG/2ML IJ SOLN
25.0000 ug | Freq: Once | INTRAMUSCULAR | Status: AC
  Administered 2020-10-12: 25 ug via INTRAVENOUS
  Filled 2020-10-12: qty 2

## 2020-10-12 MED ORDER — ACETAMINOPHEN 325 MG PO TABS: 650.0000 mg | ORAL_TABLET | Freq: Four times a day (QID) | ORAL | Status: DC | PRN

## 2020-10-12 MED ORDER — STROKE: EARLY STAGES OF RECOVERY BOOK
Freq: Once | Status: AC
  Filled 2020-10-12: qty 1

## 2020-10-12 MED ORDER — POLYETHYLENE GLYCOL 3350 17 G PO PACK: 17.0000 g | PACK | Freq: Every day | ORAL | Status: DC

## 2020-10-12 MED ORDER — OXYCODONE-ACETAMINOPHEN 5-325 MG PO TABS
1.0000 | ORAL_TABLET | ORAL | Status: DC
Start: 2020-10-12 — End: 2020-10-12

## 2020-10-12 MED ORDER — ACETAMINOPHEN 160 MG/5ML PO SOLN: 650.0000 mg | Freq: Four times a day (QID) | ORAL | Status: DC | PRN

## 2020-10-12 MED ORDER — GLUCOSAMINE CHONDR 1500 COMPLX PO CAPS: ORAL_CAPSULE | Freq: Two times a day (BID) | ORAL | Status: DC

## 2020-10-12 MED ORDER — DONEPEZIL HCL 23 MG PO TABS
23.0000 mg | ORAL_TABLET | Freq: Every day | ORAL | Status: DC
  Filled 2020-10-12 (×2): qty 1

## 2020-10-12 MED ORDER — ACETAMINOPHEN 650 MG RE SUPP: 650.0000 mg | Freq: Four times a day (QID) | RECTAL | Status: DC | PRN

## 2020-10-12 MED ORDER — OXYCODONE-ACETAMINOPHEN 5-325 MG PO TABS: 2.0000 | ORAL_TABLET | Freq: Every day | ORAL | Status: DC

## 2020-10-12 MED ORDER — FENTANYL 25 MCG/HR TD PT72
1.0000 | MEDICATED_PATCH | TRANSDERMAL | Status: DC
  Administered 2020-10-12: 1 via TRANSDERMAL
  Filled 2020-10-12: qty 1

## 2020-10-12 MED ORDER — SODIUM CHLORIDE 0.9 % IV BOLUS
500.0000 mL | Freq: Once | INTRAVENOUS | Status: AC
  Administered 2020-10-12: 500 mL via INTRAVENOUS

## 2020-10-12 MED ORDER — FUROSEMIDE 20 MG PO TABS
20.0000 mg | ORAL_TABLET | Freq: Every day | ORAL | Status: DC
  Administered 2020-10-13: 20 mg via ORAL
  Filled 2020-10-12: qty 1

## 2020-10-12 MED ORDER — PANTOPRAZOLE SODIUM 20 MG PO TBEC
20.0000 mg | DELAYED_RELEASE_TABLET | Freq: Every day | ORAL | Status: DC
  Administered 2020-10-12 – 2020-10-13 (×2): 20 mg via ORAL
  Filled 2020-10-12 (×3): qty 1

## 2020-10-12 MED ORDER — CLOPIDOGREL BISULFATE 75 MG PO TABS
75.0000 mg | ORAL_TABLET | Freq: Every day | ORAL | Status: DC
  Administered 2020-10-12: 75 mg via ORAL
  Filled 2020-10-12: qty 1

## 2020-10-12 MED ORDER — SERTRALINE HCL 50 MG PO TABS
75.0000 mg | ORAL_TABLET | Freq: Every day | ORAL | Status: DC
  Administered 2020-10-12 – 2020-10-13 (×2): 75 mg via ORAL
  Filled 2020-10-12 (×3): qty 1

## 2020-10-12 MED ORDER — METHOCARBAMOL 500 MG PO TABS
500.0000 mg | ORAL_TABLET | Freq: Two times a day (BID) | ORAL | Status: DC
  Administered 2020-10-12 – 2020-10-13 (×2): 500 mg via ORAL
  Filled 2020-10-12 (×2): qty 1

## 2020-10-12 NOTE — H&P (Signed)
History and Physical:    Stephanie Jenkins   Stephanie Jenkins DOB: 08-28-1923 DOA: 10/12/2020  Referring MD/provider: Dr. Criss Alvine PCP: Richmond Campbell., PA-C   Patient coming from: Home  Chief Complaint: Altered mental status  History of Present Illness:   Stephanie Jenkins is an 86 y.o. female with PMH significant for dementia, previous CVA, chronic lower back pain on chronic narcotic, osteoporosis and depression who lives with her daughter and is usually mobile was found to be unarousable by her daughter today.  Patient's daughter provides the entire history and notes that at baseline her mother gets out of bed and sits in the recliner and is able to go to the bathroom with assistance.  She usually gets her mother out of the house several times a week for drive or to walk around outside a little bit.  Over the past week patient apparently has been less energetic than usual.  Usually she looks forward to getting out of the house but the last week she has declined and said she was too tired to go.  This morning patient was very somnolent and was very difficult to arouse.  She apparently would not open her eyes but would just say "leave me alone".  Prior to today patient had been having good p.o. intake and had been taking her medications regularly.  Patient's daughter states there is been no irregularity in her medication dosage.  Patient's daughter states she has not had any fevers chills or malaise.  She does not have constipation.  She is not had any diarrhea or nausea or vomiting.  She has not complained of abdominal pain and has had good p.o. intake.  She does think that she noticed a right facial droop that was very subtle earlier.  ED Course:  The patient was noted to be afebrile with normal vital signs.  She was noted to have a slight right facial droop and was very lethargic.  Laboratory data were unrevealing.  Urine was negative.  X-ray was negative. Head CT was without any intracranial  abnormality.  She did complain of some abdominal pain at some point and CT abdomen and pelvis showed CBD at 1.2 cm with some sludge in the gallbladder.  Over the course of the work-up, patient did gradually wake up and mental status has returned to baseline per patient's daughter.  Patient is now admitted for altered mental status.  Brain MRI and MRCP have been ordered.  Patient herself tells me "I am fine.  Any problem I have is from old age".  ROS:   ROS   Review of Systems: Per HPI  Past Medical History:   Past Medical History:  Diagnosis Date  . Carotid bruit    bilateral  . Chronic diastolic CHF (congestive heart failure) (HCC)   . Closed pelvic fracture (HCC) 05/05/2017  . DEGENERATIVE JOINT DISEASE 07/28/2008  . DIABETES MELLITUS, TYPE II, CONTROLLED 07/27/2007  . HYPERCHOLESTEROLEMIA, MILD 07/27/2007  . Impaired glucose tolerance 08/07/2012  . Leg edema   . OSTEOPOROSIS 07/14/2007  . Pelvic fracture (HCC) 04/04/2017    Past Surgical History:   Past Surgical History:  Procedure Laterality Date  . ABDOMINAL HYSTERECTOMY    . CATARACT EXTRACTION    . TONSILLECTOMY      Social History:   Social History   Socioeconomic History  . Marital status: Widowed    Spouse name: Not on file  . Number of children: Not on file  . Years of education: Not  on file  . Highest education level: Not on file  Occupational History  . Not on file  Tobacco Use  . Smoking status: Never Smoker  . Smokeless tobacco: Former Neurosurgeon    Types: Engineer, drilling  . Vaping Use: Never used  Substance and Sexual Activity  . Alcohol use: No  . Drug use: No  . Sexual activity: Not on file  Other Topics Concern  . Not on file  Social History Narrative  . Not on file   Social Determinants of Health   Financial Resource Strain: Not on file  Food Insecurity: Not on file  Transportation Needs: Not on file  Physical Activity: Not on file  Stress: Not on file  Social Connections: Not on file   Intimate Partner Violence: Not on file    Allergies   Hydrocodone-acetaminophen, Hydrocodone-acetaminophen, and Oxycodone  Family history:   Family History  Problem Relation Age of Onset  . Hypertension Other     Current Medications:   Prior to Admission medications   Medication Sig Start Date End Date Taking? Authorizing Provider  Ascorbic Acid (VITAMIN C PO) Take 1 tablet by mouth daily.   Yes [provider]  aspirin 81 MG tablet Take 162 mg by mouth at bedtime.   Yes [provider]  B Complex-C (SUPER B COMPLEX PO) Take 1 tablet by mouth daily.   Yes [provider]  Chlorpheniramine Maleate (ALLERGY PO) Take 1 tablet by mouth daily.   Yes [provider]  cholecalciferol (VITAMIN D) 400 units TABS tablet Take 800 Units by mouth daily.   Yes [provider]  clopidogrel (PLAVIX) 75 MG tablet Take 75 mg by mouth daily. 02/26/19  Yes [provider]  donepezil (ARICEPT) 23 MG TABS tablet Take 23 mg by mouth at bedtime. 09/04/19  Yes [provider]  fentaNYL (DURAGESIC) 25 MCG/HR Place 1 patch onto the skin every 3 (three) days. 09/09/19  Yes [provider]  Ferrous Sulfate (IRON PO) Take 1 tablet by mouth daily.   Yes [provider]  furosemide (LASIX) 20 MG tablet Take 1 tablet by mouth once daily Patient taking differently: Take 20 mg by mouth daily. 01/24/20  Yes Turner, Cornelious Bryant, MD  Glucosamine-Chondroit-Vit C-Mn (GLUCOSAMINE CHONDR 1500 COMPLX PO) Take 1 tablet by mouth 2 (two) times daily.    Yes [provider]  methocarbamol (ROBAXIN) 500 MG tablet Take 1 tablet (500 mg total) by mouth every 8 (eight) hours as needed for muscle spasms. Patient taking differently: Take 500 mg by mouth 2 (two) times daily. 05/08/17  Yes Noralee Stain, DO  Multiple Vitamin (MULTIVITAMIN) tablet Take 1 tablet by mouth daily.   Yes [provider]  oxyCODONE-acetaminophen (PERCOCET/ROXICET) 5-325  MG tablet Take 1-2 tablets by mouth See admin instructions. Take 2 tablets by mouth in am and 1 tablet at bedtime. May take one additional tablet if pain increases. 08/29/19  Yes [provider]  pantoprazole (PROTONIX) 20 MG tablet Take 1 tablet (20 mg total) by mouth daily for 14 days. 10/31/18 09/16/19 Yes Joy, Shawn C, PA-C  polyethylene glycol (MIRALAX / GLYCOLAX) packet Take 17 g by mouth 2 (two) times daily. Patient taking differently: Take 17 g by mouth daily. 05/06/17  Yes Richarda Overlie, MD  sertraline (ZOLOFT) 50 MG tablet Take 75 mg by mouth daily.   Yes [provider]  TURMERIC PO Take 1 capsule by mouth daily.   Yes [provider]  Physical Exam:   Vitals:   10/12/20 1300 10/12/20 1345 10/12/20 1430 10/12/20 1515  BP: (!) 142/55 (!) 126/56 112/78 (!) 111/51  Pulse: (!) 59 67 63 (!) 59  Resp: 14 11 16 13   Temp:      TempSrc:      SpO2: 100% 98% 100% 98%     Physical Exam: Blood pressure (!) 111/51, pulse (!) 59, temperature (!) 97.5 F (36.4 C), temperature source Oral, resp. rate 13, SpO2 98 %. Gen: Patient appearing stated age sitting up in stretcher undergoing SLP evaluation by speech therapist.  Patient is cooperative with exam. Eyes: sclera anicteric, conjuctiva mildly injected bilaterally CVS: S1-S2, regulary, no gallops Respiratory:  decreased air entry likely secondary to decreased inspiratory effort GI: NABS, soft, no tenderness to light palpation.  Patient has some tenderness to deep palpation in the bilateral upper quadrants.  No tenderness in her lower quadrants.  No voluntary guarding.  No rebound tenderness.  Murphy sign is negative. LE: No edema. No cyanosis Neuro: Patient does have a very slight right facial droop.  There does seem to be some abnormality of cranial nerve VII.  Other cranial nerves seem intact along with intact gag reflex.  Patient is moving her arms and legs bilaterally.  Her speech and comprehension is intact.   She does not appear to have any dysarthria.  Data Review:    Labs: Basic Metabolic Panel: Recent Labs  Lab 10/12/20 1026  NA 138  K 4.3  CL 102  CO2 24  GLUCOSE 115*  BUN 18  CREATININE 1.47*  CALCIUM 9.3   Liver Function Tests: Recent Labs  Lab 10/12/20 1026  AST 21  ALT 14  ALKPHOS 59  BILITOT 0.7  PROT 6.5  ALBUMIN 3.9   No results for input(s): LIPASE, AMYLASE in the last 168 hours. No results for input(s): AMMONIA in the last 168 hours. CBC: Recent Labs  Lab 10/12/20 1026  WBC 8.4  NEUTROABS 5.0  HGB 11.5*  HCT 36.7  MCV 103.4*  PLT 251   Cardiac Enzymes: No results for input(s): CKTOTAL, CKMB, CKMBINDEX, TROPONINI in the last 168 hours.  BNP (last 3 results) No results for input(s): PROBNP in the last 8760 hours. CBG: Recent Labs  Lab 10/12/20 1010  GLUCAP 99    Urinalysis    Component Value Date/Time   COLORURINE YELLOW 10/12/2020 1056   APPEARANCEUR CLEAR 10/12/2020 1056   LABSPEC 1.017 10/12/2020 1056   PHURINE 5.0 10/12/2020 1056   GLUCOSEU NEGATIVE 10/12/2020 1056   HGBUR NEGATIVE 10/12/2020 1056   HGBUR 2+ 07/27/2007 0000   BILIRUBINUR NEGATIVE 10/12/2020 1056   BILIRUBINUR neg 05/02/2017 1110   KETONESUR NEGATIVE 10/12/2020 1056   PROTEINUR NEGATIVE 10/12/2020 1056   UROBILINOGEN 0.2 05/02/2017 1110   UROBILINOGEN 0.2 07/27/2007 0000   NITRITE NEGATIVE 10/12/2020 1056   LEUKOCYTESUR NEGATIVE 10/12/2020 1056      Radiographic Studies: CT ABDOMEN PELVIS WO CONTRAST  Result Date: 10/12/2020 CLINICAL DATA:  Abdominal pain EXAM: CT ABDOMEN AND PELVIS WITHOUT CONTRAST TECHNIQUE: Multidetector CT imaging of the abdomen and pelvis was performed following the standard protocol without IV contrast. COMPARISON:  10/31/2018 FINDINGS: Lower chest: No acute abnormality. Bandlike scarring of the right lung base. Dense mitral annulus calcifications. Hepatobiliary: No solid liver abnormality is seen. Minimal sludge in the dependent  gallbladder. No discrete gallstones. No gallbladder wall thickening. Intra and extrahepatic biliary ductal dilatation, the central common bile duct measuring up to 1.2 cm in caliber, this appearance new  compared to prior examination dated 2019. Pancreas: Unremarkable. Unchanged prominence of the pancreatic duct. No surrounding inflammatory changes. Spleen: Normal in size without significant abnormality. Adrenals/Urinary Tract: Adrenal glands are unremarkable. Kidneys are normal, without renal calculi, solid lesion, or hydronephrosis. Bladder is unremarkable. Stomach/Bowel: Stomach is within normal limits. Appendix is not clearly visualized. No evidence of bowel wall thickening, distention, or inflammatory changes. Vascular/Lymphatic: Aortic atherosclerosis. No enlarged abdominal or pelvic lymph nodes. Reproductive: Status post hysterectomy. Other: No abdominal wall hernia or abnormality. No abdominopelvic ascites. Musculoskeletal: No acute or significant osseous findings. IMPRESSION: Intra- and extrahepatic biliary ductal dilatation, the central common bile duct measuring up to 1.2 cm in caliber, this appearance new compared to prior examination dated 2019. There is sludge in the gallbladder without discrete gallstones or gallbladder wall thickening, nor obstructing calculus identified in the common bile duct. Correlate with clinical and laboratory evidence of biliary obstruction and consider MRCP or HIDA to further evaluate. Aortic Atherosclerosis (ICD10-I70.0). Electronically Signed   By: Lauralyn PrimesAlex  Bibbey M.D.   On: 10/12/2020 16:02   CT HEAD WO CONTRAST  Result Date: 10/12/2020 CLINICAL DATA:  Delirium with facial droop EXAM: CT HEAD WITHOUT CONTRAST TECHNIQUE: Contiguous axial images were obtained from the base of the skull through the vertex without intravenous contrast. COMPARISON:  03/16/2019 FINDINGS: Brain: No acute infarct or intracranial hemorrhage. Calcified right tentorial focus may reflect a small  meningioma. No midline shift, ventriculomegaly or extra-axial fluid collection. Moderate cerebral atrophy with ex vacuo dilatation. Chronic microvascular ischemic changes. Vascular: No hyperdense vessel or unexpected calcification. Bilateral skull base atherosclerotic calcifications. Skull: Negative for fracture or focal lesion. Sinuses/Orbits: No acute finding. Pneumatized paranasal sinuses and mastoid air cells. Other: None. IMPRESSION: 1. No acute intracranial abnormality. 2. Moderate cerebral atrophy and chronic microvascular ischemic changes. Electronically Signed   By: Stana Buntinghikanele  Emekauwa M.D.   On: 10/12/2020 12:43   DG Chest Port 1 View  Result Date: 10/12/2020 CLINICAL DATA:  Altered mental status, weakness. EXAM: PORTABLE CHEST 1 VIEW COMPARISON:  Prior chest radiographs 07/20/2018 and earlier. FINDINGS: Overlying cardiac monitoring leads. Heart size within normal limits. Aortic atherosclerosis. No appreciable airspace consolidation or pulmonary edema. No evidence of pleural effusion or pneumothorax. No acute bony abnormality identified. Thoracic spondylosis. IMPRESSION: No evidence of acute cardiopulmonary abnormality. Aortic Atherosclerosis (ICD10-I70.0). Electronically Signed   By: Jackey LogeKyle  Golden DO   On: 10/12/2020 10:45    EKG: Independently reviewed.  NSR at 55.  Left axis deviation at -50.  Poor R wave progression.  Mildly biphasic T waves 3, F.  Assessment/Plan:   Principal Problem:   Altered mental status, unspecified Active Problems:   Osteoarthritis   Osteoporosis   Chronic diastolic CHF (congestive heart failure) (HCC)   Diabetes mellitus type 2, controlled (HCC)   Chronic lower back pain   86 year old female presents with somnolence and alteration in mental status which is now resolved.  Work-up is notable for a very mild right facial droop.   Altered mental status No obvious toxic or metabolic causes of altered mental status revealed so far Given what appears to be a  new right facial droop, she may have had had a TIA earlier today Brain MRI is pending Will order echocardiogram Continue aspirin and Plavix Patient does not appear to be on a statin at home, this can be addressed here as warranted  Abnormal CT with CBD 1.5 cm Patient with some tenderness in her upper quadrants bilaterally to deep palpation, nontender to light palpation.  Eulah PontMurphy sign  is equivocal. Given increase in CBD size with sludge in the gallbladder, MRCP was ordered and is pending. No evidence for infection, will not start any antibiotics at present  Chronic lower back pain Continue fentanyl patch per home doses Continue Percocet per home doses Continue methocarbamol twice daily standing which is what she gets at home as well. Of note oxycodone is listed under allergies however patient's daughter states that she is allergic to hydrocodone but tolerates only oxycodone and has been taking at home without difficulty.  We will continue Percocet per home doses.  Dementia complicated by anxiety and depression Continue Aricept and sertraline per home doses  HFpEF Not decompensated, continue Lasix 20 mg daily  GERD Continue PPI    Other information:   DVT prophylaxis: SCD ordered. Code Status: DNR Family Communication: Patient's daughter was at bedside throughout Disposition Plan: Likely home Consults called: None Admission status: Inpatient  Senetra Dillin Tublu Sherlyn Ebbert [facility]  If 7PM-7AM, please contact night-coverage [email or web address] Password [facility] 10/12/2020, 4:42 PM

## 2020-10-12 NOTE — ED Provider Notes (Signed)
Care transferred to me.  No clear source for the patient's generalized weakness and inability/refusing to walk.  Otherwise, labs/CTs are fairly unremarkable.  Her LFTs are normal though given she is having some vague abdominal discomfort it would not be unreasonable to get MRCP.  I discussed with Dr. Luberta Robertson for admission. Will order MRI brain and MRCP. Admit to hospitalist service.    Pricilla Loveless, MD 10/12/20 [phone]

## 2020-10-12 NOTE — ED Provider Notes (Signed)
[facility] Emergency Department Provider Note MRN:  [id number]  Arrival date & time: 10/12/20     Chief Complaint   Weakness   History of Present Illness   Stephanie Jenkins is a 86 y.o. year-old female with a history of hypertension, diabetes, CHF presenting to the ED with chief complaint of altered mental status.  Patient has been feeling generally unwell for the past week. This morning is confused, altered, too weak to get out of bed. Occasional cough this week, occasional back pain this week which is not abnormal for her. Occasional abdominal pain this week which is also not abnormal for her. Otherwise no new symptoms, no fever per daughter at bedside. Question of new facial droop noticed by EMS, last known well 7 PM last night.  I was unable to obtain an accurate HPI, PMH, or ROS due to the patient's altered mental status.  Level 5 caveat.  Review of Systems  Positive for confusion, weak, facial droop.  Patient's Health History    Past Medical History:  Diagnosis Date  . Carotid bruit    bilateral  . Chronic diastolic CHF (congestive heart failure) (HCC)   . Closed pelvic fracture (HCC) 05/05/2017  . DEGENERATIVE JOINT DISEASE 07/28/2008  . DIABETES MELLITUS, TYPE II, CONTROLLED 07/27/2007  . HYPERCHOLESTEROLEMIA, MILD 07/27/2007  . Impaired glucose tolerance 08/07/2012  . Leg edema   . OSTEOPOROSIS 07/14/2007  . Pelvic fracture (HCC) 04/04/2017    Past Surgical History:  Procedure Laterality Date  . ABDOMINAL HYSTERECTOMY    . CATARACT EXTRACTION    . TONSILLECTOMY      Family History  Problem Relation Age of Onset  . Hypertension Other     Social History   Socioeconomic History  . Marital status: Widowed    Spouse name: Not on file  . Number of children: Not on file  . Years of education: Not on file  . Highest education level: Not on file  Occupational History  . Not on file  Tobacco Use  . Smoking status: Never Smoker  . Smokeless  tobacco: Former Neurosurgeon    Types: Engineer, drilling  . Vaping Use: Never used  Substance and Sexual Activity  . Alcohol use: No  . Drug use: No  . Sexual activity: Not on file  Other Topics Concern  . Not on file  Social History Narrative  . Not on file   Social Determinants of Health   Financial Resource Strain: Not on file  Food Insecurity: Not on file  Transportation Needs: Not on file  Physical Activity: Not on file  Stress: Not on file  Social Connections: Not on file  Intimate Partner Violence: Not on file     Physical Exam   Vitals:   10/12/20 1430 10/12/20 1515  BP: 112/78 (!) 111/51  Pulse: 63 (!) 59  Resp: 16 13  Temp:    SpO2: 100% 98%    CONSTITUTIONAL: Chronically ill-appearing, NAD NEURO: Possible subtle left facial droop, not alert or oriented, moves all extremities EYES:  eyes equal and reactive ENT/NECK:  no LAD, no JVD CARDIO: Regular rate, well-perfused, normal S1 and S2 PULM:  CTAB no wheezing or rhonchi GI/GU:  normal bowel sounds, non-distended, non-tender MSK/SPINE:  No gross deformities, no edema SKIN:  no rash, atraumatic PSYCH:  Appropriate speech and behavior  *Additional and/or pertinent findings included in MDM below  Diagnostic and Interventional Summary    EKG Interpretation  Date/Time:  Thursday October 12 2020  10:09:38 EST Ventricular Rate:  55 PR Interval:    QRS Duration: 108 QT Interval:  491 QTC Calculation: 470 R Axis:   -52 Text Interpretation: Sinus rhythm Ventricular premature complex Left anterior fascicular block Abnormal R-wave progression, early transition Left ventricular hypertrophy Anterior Q waves, possibly due to LVH Confirmed by Kennis Carina [phone]) on 10/12/2020 11:32:56 AM      Labs Reviewed  BASIC METABOLIC PANEL - Abnormal; Notable for the following components:      Result Value   Glucose, Bld 115 (*)    Creatinine, Ser 1.47 (*)    GFR, Estimated 33 (*)    All other components within normal limits   CBC WITH DIFFERENTIAL/PLATELET - Abnormal; Notable for the following components:   RBC 3.55 (*)    Hemoglobin 11.5 (*)    MCV 103.4 (*)    All other components within normal limits  POC OCCULT BLOOD, ED - Abnormal; Notable for the following components:   Fecal Occult Bld POSITIVE (*)    All other components within normal limits  RESP PANEL BY RT-PCR (FLU A&B, COVID) ARPGX2  URINALYSIS, ROUTINE W REFLEX MICROSCOPIC  PROTIME-INR  APTT  HEPATIC FUNCTION PANEL  CBG MONITORING, ED  CBG MONITORING, ED  TROPONIN I (HIGH SENSITIVITY)  TROPONIN I (HIGH SENSITIVITY)    CT HEAD WO CONTRAST  Final Result    DG Chest Port 1 View  Final Result    CT ABDOMEN PELVIS WO CONTRAST    (Results Pending)    Medications  fentaNYL (SUBLIMAZE) injection 25 mcg (has no administration in time range)  sodium chloride 0.9 % bolus 500 mL (0 mLs Intravenous Stopped 10/12/20 1105)     Procedures  /  Critical Care Procedures  ED Course and Medical Decision Making  I have reviewed the triage vital signs, the nursing notes, and pertinent available records from the EMR.  Listed above are laboratory and imaging tests that I personally ordered, reviewed, and interpreted and then considered in my medical decision making (see below for details).  Favoring a metabolic etiology of patient's altered mental status such as metabolic disarray, UTI. However given the possibility of new facial droop, acute ischemic stroke is also a possibility. Awaiting labs, CT head. At bedside patient has black stool, reportedly takes iron, Hemoccult pending.     Hemoccult is positive but H&H is reassuring, labs overall unrevealing.  CT head normal.  Patient is now complaining of persistent moderate to severe lower abdominal pain.  Will obtain CT to exclude surgical process.  Patient cannot ambulate, encephalopathic, will need admission.  Signed out to oncoming provider at shift change.  Elmer Sow. Pilar Plate, MD [facility]  Medicine [facility] [email or web address]  Final Clinical Impressions(s) / ED Diagnoses     ICD-10-CM   1. Altered mental status, unspecified altered mental status type  R41.82   2. Abdominal pain, unspecified abdominal location  R10.9   3. Weakness  R53.1     ED Discharge Orders    None       Discharge Instructions Discussed with and Provided to Patient:   Discharge Instructions   None       Sabas Sous, MD 10/12/20 1545

## 2020-10-12 NOTE — ED Triage Notes (Signed)
Patient BIB [facility] for generalized weakness and right sided facial droop that started when the patient woke this morning. Last known well was last night at 1900. Patient oriented to self only. Grip strength equal bilaterally.

## 2020-10-12 NOTE — Evaluation (Signed)
Clinical/Bedside Swallow Evaluation Patient Details  Name: Stephanie Jenkins MRN: [id number] Date of Birth: May 14, 1923  Today's Date: 10/12/2020 Time: SLP Start Time (ACUTE ONLY): 1615 SLP Stop Time (ACUTE ONLY): 1630 SLP Time Calculation (min) (ACUTE ONLY): 15 min  Past Medical History:  Past Medical History:  Diagnosis Date  . Carotid bruit    bilateral  . Chronic diastolic CHF (congestive heart failure) (HCC)   . Closed pelvic fracture (HCC) 05/05/2017  . DEGENERATIVE JOINT DISEASE 07/28/2008  . DIABETES MELLITUS, TYPE II, CONTROLLED 07/27/2007  . HYPERCHOLESTEROLEMIA, MILD 07/27/2007  . Impaired glucose tolerance 08/07/2012  . Leg edema   . OSTEOPOROSIS 07/14/2007  . Pelvic fracture (HCC) 04/04/2017   Past Surgical History:  Past Surgical History:  Procedure Laterality Date  . ABDOMINAL HYSTERECTOMY    . CATARACT EXTRACTION    . TONSILLECTOMY     HPI:  Stephanie Jenkins is a 86 y.o. year-old female with a history of hypertension, diabetes, CHF presenting to the ED with chief complaint of altered mental status. CT neg. Dtr described TIA symptoms one year ago with mild swallow difficulties without stroke identified. CXR No evidence of acute cardiopulmonary abnormality.   Assessment / Plan / Recommendation Clinical Impression  Pt has mild right sided labial weakness and independently checked left side oral cavity for residue. When cued, she was able to mobilize tongue to right. Lingual protrusion is symmetrical. Multiple straw sips thin, puree and cracker all consumed without s/s aspiration. Per RN she is more alert than this morning after receiving IV fluids. Recommend Dys 3, thin liquids and will follow up for safety and upgrade to regular. SLP Visit Diagnosis: Dysphagia, unspecified (R13.10)    Aspiration Risk  Mild aspiration risk    Diet Recommendation Dysphagia 3 (Mech soft);Thin liquid   Liquid Administration via: Straw;Cup Medication Administration: Whole meds with  liquid Supervision: Staff to assist with self feeding;Full supervision/cueing for compensatory strategies Compensations: Slow rate;Small sips/bites Postural Changes: Seated upright at 90 degrees    Other  Recommendations Oral Care Recommendations: Oral care BID   Follow up Recommendations None      Frequency and Duration min 1 x/week  1 week       Prognosis Prognosis for Safe Diet Advancement: Good      Swallow Study   General Date of Onset: 10/12/20 HPI: Stephanie Jenkins is a 41 y.o. year-old female with a history of hypertension, diabetes, CHF presenting to the ED with chief complaint of altered mental status. CT neg. Dtr described TIA symptoms one year ago with mild swallow difficulties without stroke identified. CXR No evidence of acute cardiopulmonary abnormality. Type of Study: Bedside Swallow Evaluation Previous Swallow Assessment:  (none) Diet Prior to this Study: NPO Temperature Spikes Noted: No Respiratory Status: Room air History of Recent Intubation: No Behavior/Cognition: Alert;Cooperative;Pleasant mood;Requires cueing Oral Cavity Assessment: Within Functional Limits Oral Care Completed by SLP: No Oral Cavity - Dentition: Dentures, top;Dentures, bottom Vision: Functional for self-feeding Self-Feeding Abilities: Needs assist Patient Positioning: Upright in bed Baseline Vocal Quality: Normal Volitional Cough: Strong Volitional Swallow: Able to elicit    Oral/Motor/Sensory Function Overall Oral Motor/Sensory Function: Mild impairment Facial ROM: Reduced right Facial Symmetry: Abnormal symmetry right Facial Strength: Reduced right Lingual ROM: Within Functional Limits Lingual Symmetry: Within Functional Limits   Ice Chips Ice chips: Not tested   Thin Liquid Thin Liquid: Within functional limits Presentation: Cup;Straw    Nectar Thick Nectar Thick Liquid: Not tested   Honey Thick Honey Thick Liquid: Not  tested   Puree Puree: Within functional limits    Solid     Solid: Within functional limits      Royce Macadamia 10/12/2020,4:50 PM Breck Coons [place].Ed Nurse, children's [phone] Office [phone]

## 2020-10-13 ENCOUNTER — Other Ambulatory Visit (HOSPITAL_COMMUNITY): Payer: PPO

## 2020-10-13 ENCOUNTER — Encounter (HOSPITAL_COMMUNITY): Payer: PPO

## 2020-10-13 DIAGNOSIS — R4182 Altered mental status, unspecified: Secondary | ICD-10-CM | POA: Diagnosis present

## 2020-10-13 DIAGNOSIS — R400 Somnolence: Secondary | ICD-10-CM | POA: Diagnosis not present

## 2020-10-13 LAB — LIPID PANEL
Cholesterol: 225 mg/dL — ABNORMAL HIGH (ref 0–200)
HDL: 54 mg/dL (ref >40)
LDL Cholesterol: 150 mg/dL — ABNORMAL HIGH (ref 0–99)
Total CHOL/HDL Ratio: 4.2 RATIO
Triglycerides: 104 mg/dL (ref <150)
VLDL: 21 mg/dL (ref 0–40)

## 2020-10-13 LAB — HEMOGLOBIN A1C
Hgb A1c MFr Bld: 6.4 % — ABNORMAL HIGH (ref 4.8–5.6)
Mean Plasma Glucose: 136.98 mg/dL

## 2020-10-13 MED ORDER — METHOCARBAMOL 500 MG PO TABS: 500.0000 mg | ORAL_TABLET | Freq: Two times a day (BID) | ORAL | Status: AC

## 2020-10-13 MED ORDER — POLYETHYLENE GLYCOL 3350 17 G PO PACK: 17.0000 g | PACK | Freq: Every day | ORAL | Status: AC

## 2020-10-13 MED ORDER — CLOPIDOGREL BISULFATE 75 MG PO TABS: 75.0000 mg | ORAL_TABLET | Freq: Every day | ORAL | Status: AC

## 2020-10-13 MED ORDER — CLOPIDOGREL BISULFATE 75 MG PO TABS: 75.0000 mg | ORAL_TABLET | Freq: Every day | ORAL | Status: DC

## 2020-10-13 NOTE — Progress Notes (Signed)
Occupational Therapy Evaluation Patient Details Name: Stephanie Jenkins MRN: [id number] DOB: October 12, 1923 Today's Date: 10/13/2020    History of Present Illness 86 y.o. year-old female with a history of hypertension, diabetes, CHF presenting to the ED with chief complaint of altered mental status   Clinical Impression   PTA pt used rollator with assistance at times for mobility and ADL. Pt has 24/7 PCA M-F and daughters assist on weekends. Pt lethargic however became more alert once sitting. Required Mod A +2 for safety with mobility and able to mobilize to [facility] [employer]. Max A with LB ADL.Daughter present and states she will have available assistance at DC to help with her Mom. Recommend that family primarily use wc to reduce risk of falls until pt regains strength. Recommend follow up with HHOT.     Follow Up Recommendations  Home health OT;Supervision/Assistance - 24 hour    Equipment Recommendations  None recommended by OT    Recommendations for Other Services       Precautions / Restrictions Precautions Precautions: Fall      Mobility Bed Mobility Overal bed mobility: Needs Assistance Bed Mobility: Supine to Sit;Sit to Supine     Supine to sit: Max assist Sit to supine: Max assist;+2 for physical assistance        Transfers Overall transfer level: Needs assistance Equipment used: Rolling walker (2 wheeled) Transfers: Sit to/from [employer] Sit to Stand: Mod assist;+2 safety/equipment Stand pivot transfers: Mod assist;+2 safety/equipment            Balance Overall balance assessment: History of Falls;Needs assistance   Sitting balance-Leahy Scale: Fair       Standing balance-Leahy Scale: Poor                             ADL either performed or assessed with clinical judgement   ADL Overall ADL's : Needs assistance/impaired     Grooming: Minimal assistance   Upper Body Bathing: Minimal assistance   Lower Body Bathing: Maximal  assistance;Sit to/from stand   Upper Body Dressing : Moderate assistance   Lower Body Dressing: Maximal assistance   Toilet Transfer: Moderate assistance;+2 for safety/equipment   Toileting- Clothing Manipulation and Hygiene: Maximal assistance;Sit to/from stand       Functional mobility during ADLs: Moderate assistance;+2 for safety/equipment;Rolling walker;Cueing for safety       Vision Baseline Vision/History: Wears glasses       Perception     Praxis      Pertinent Vitals/Pain Pain Assessment: Faces Faces Pain Scale: Hurts whole lot Pain Location: back Pain Descriptors / Indicators: Aching;Grimacing;Guarding Pain Intervention(s): Limited activity within patient's tolerance;RN gave pain meds during session     Hand Dominance Right   Extremity/Trunk Assessment Upper Extremity Assessment Upper Extremity Assessment: Generalized weakness   Lower Extremity Assessment Lower Extremity Assessment: Defer to PT evaluation   Cervical / Trunk Assessment Cervical / Trunk Assessment: Kyphotic   Communication Communication Communication: HOH   Cognition Arousal/Alertness: Lethargic Behavior During Therapy: WFL for tasks assessed/performed Overall Cognitive Status: History of cognitive impairments - at baseline                                 General Comments: per daughter   General Comments  dtr present    Exercises     Shoulder Instructions      Home Living Family/patient expects to be discharged  to:: Private residence Living Arrangements: Children;Non-relatives/Friends Available Help at Discharge: Personal care attendant;Available 24 hours/day (dtr stays with her Sat/Sun) Type of Home: House Home Access: Ramped entrance     Home Layout: One level     Bathroom Shower/Tub: Producer, television/film/video: Handicapped height Bathroom Accessibility: Yes How Accessible: Accessible via walker Home Equipment: Walker - 2 wheels;Grab bars -  tub/shower;Grab bars - toilet;Wheelchair - manual;Cane - single point;Shower seat;Bedside commode;Hospital bed;Walker - 4 wheels (lift chair)          Prior Functioning/Environment Level of Independence: Independent with assistive device(s);Needs assistance  Gait / Transfers Assistance Needed: usually modified independent ADL's / Homemaking Assistance Needed: is able to do her own self care; Assist for pericare after BM            OT Problem List: Decreased strength;Decreased range of motion;Decreased activity tolerance;Impaired balance (sitting and/or standing);Decreased safety awareness;Decreased knowledge of use of DME or AE;Pain      OT Treatment/Interventions:      OT Goals(Current goals can be found in the care plan section) Acute Rehab OT Goals Patient Stated Goal: to return home OT Goal Formulation: All assessment and education complete, DC therapy  OT Frequency:     Barriers to D/C:            Co-evaluation PT/OT/SLP Co-Evaluation/Treatment: Yes     OT goals addressed during session: ADL's and self-care      AM-PAC OT "6 Clicks" Daily Activity     Outcome Measure Help from another person eating meals?: A Little Help from another person taking care of personal grooming?: A Little Help from another person toileting, which includes using toliet, bedpan, or urinal?: A Lot Help from another person bathing (including washing, rinsing, drying)?: A Lot Help from another person to put on and taking off regular upper body clothing?: A Lot Help from another person to put on and taking off regular lower body clothing?: A Lot 6 Click Score: 14   End of Session Equipment Utilized During Treatment: Gait belt;Rolling walker Nurse Communication: Mobility status;Other (comment) (DC needs)  Activity Tolerance: Patient tolerated treatment well Patient left: in bed;with call bell/phone within reach;with family/visitor present  OT Visit Diagnosis: Unsteadiness on feet  (R26.81);Other abnormalities of gait and mobility (R26.89);Repeated falls (R29.6);Muscle weakness (generalized) (M62.81);Other symptoms and signs involving cognitive function;Pain Pain - part of body:  (back)                Time: 2440-1027 OT Time Calculation (min): 31 min Charges:  OT General Charges $OT Visit: 1 Visit OT Evaluation $OT Eval Moderate Complexity: 1 Mod  Kelsi Benham, OT/L   Acute OT Clinical Specialist Acute Rehabilitation Services Pager [phone] Office [phone]   [facility] 10/13/2020, 10:13 AM

## 2020-10-13 NOTE — ED Notes (Addendum)
Patient's daughter updated on awaiting the following per MD, "she should not be discharged until home health has been arranged and the [facility] team has given her the code 28."  Patient provided with apple juice x 2 and sandwich bag. Offered pain medications and daughter declined at this time as she seems comfortable, but eager to leave. RN agrees with her assessment. Patient being assisted to eat by daughter at this time.

## 2020-10-13 NOTE — Evaluation (Signed)
Physical Therapy Evaluation Patient Details Name: Stephanie Jenkins MRN: [id number] DOB: 1923/08/30 Today's Date: 10/13/2020   History of Present Illness  86 y.o. year-old female with a history of hypertension, diabetes, CHF presenting to the ED with chief complaint of altered mental status  Clinical Impression  Pt admitted secondary to problem above with deficits below. Required max A +2 for bed mobility and mod A +2 for gait and transfers using RW. Pt very sleepy throughout. Pt's daughter present and reports they have a 24 hour caregiver M-F and daughter assists Saturday and Sunday. Pt's daughter currently refusing SNF and wants to take pt home. Educated about need for +2 assist and use of WC. Will continue to follow acutely.     Follow Up Recommendations Home health PT;Supervision/Assistance - 24 hour (refusing SNF)    Equipment Recommendations  None recommended by PT    Recommendations for Other Services       Precautions / Restrictions Precautions Precautions: Fall Restrictions Weight Bearing Restrictions: No      Mobility  Bed Mobility Overal bed mobility: Needs Assistance Bed Mobility: Supine to Sit;Sit to Supine     Supine to sit: Max assist;+2 for physical assistance Sit to supine: Max assist;+2 for physical assistance   General bed mobility comments: Required assist for trunk and LE assist. Increased time required secondary to pain.    Transfers Overall transfer level: Needs assistance Equipment used: Rolling walker (2 wheeled) Transfers: Sit to/from [employer] Sit to Stand: Mod assist;+2 safety/equipment Stand pivot transfers: Mod assist;+2 safety/equipment       General transfer comment: Required assist for lift assist and steadying to stand and perform transfers. Pt with very flexed posture.  Ambulation/Gait Ambulation/Gait assistance: Mod assist;+2 physical assistance Gait Distance (Feet): 10 Feet Assistive device: Rolling walker (2  wheeled) Gait Pattern/deviations: Step-through pattern;Decreased stride length;Shuffle;Trunk flexed Gait velocity: Decreased   General Gait Details: Pt with very flexed trunk and requiring Mod A +2 for steadying throughout. Distance limited secondary to pain. Cues for sequencing using RW.  Stairs            Wheelchair Mobility    Modified Rankin (Stroke Patients Only)       Balance Overall balance assessment: History of Falls;Needs assistance Sitting-balance support: Feet supported Sitting balance-Leahy Scale: Fair     Standing balance support: Bilateral upper extremity supported Standing balance-Leahy Scale: Poor Standing balance comment: Reliant on BUE and external support                             Pertinent Vitals/Pain Pain Assessment: Faces Faces Pain Scale: Hurts whole lot Pain Location: back Pain Descriptors / Indicators: Aching;Grimacing;Guarding Pain Intervention(s): Monitored during session;Limited activity within patient's tolerance;Repositioned    Home Living Family/patient expects to be discharged to:: Private residence Living Arrangements: Children;Non-relatives/Friends Available Help at Discharge: Personal care attendant;Available 24 hours/day (dtr stays with her Sat/Sun) Type of Home: House Home Access: Ramped entrance     Home Layout: One level Home Equipment: Walker - 2 wheels;Grab bars - tub/shower;Grab bars - toilet;Wheelchair - manual;Cane - single point;Shower seat;Bedside commode;Hospital bed;Walker - 4 wheels (lift chair)      Prior Function Level of Independence: Independent with assistive device(s);Needs assistance   Gait / Transfers Assistance Needed: usually modified independent  ADL's / Homemaking Assistance Needed: is able to do her own self care; Assist for pericare after BM        Hand Dominance  Dominant Hand: Right    Extremity/Trunk Assessment   Upper Extremity Assessment Upper Extremity Assessment:  Defer to OT evaluation    Lower Extremity Assessment Lower Extremity Assessment: Generalized weakness    Cervical / Trunk Assessment Cervical / Trunk Assessment: Kyphotic  Communication   Communication: HOH  Cognition Arousal/Alertness: Lethargic Behavior During Therapy: WFL for tasks assessed/performed Overall Cognitive Status: History of cognitive impairments - at baseline                                 General Comments: per daughter hx of cognitive impairment. Very sleepy throughout.      General Comments General comments (skin integrity, edema, etc.): Daughter present. Educated about need for +2 assist and use of WC initially.    Exercises     Assessment/Plan    PT Assessment Patient needs continued PT services  PT Problem List Decreased strength;Decreased activity tolerance;Decreased balance;Decreased mobility;Decreased cognition;Decreased safety awareness;Decreased knowledge of precautions;Pain       PT Treatment Interventions Gait training;DME instruction;Therapeutic activities;Functional mobility training;Balance training;Therapeutic exercise;Patient/family education    PT Goals (Current goals can be found in the Care Plan section)  Acute Rehab PT Goals Patient Stated Goal: to return home PT Goal Formulation: With patient/family Time For Goal Achievement: 10/27/20 Potential to Achieve Goals: Good    Frequency Min 3X/week   Barriers to discharge        Co-evaluation PT/OT/SLP Co-Evaluation/Treatment: Yes Reason for Co-Treatment: Complexity of the patient's impairments (multi-system involvement);To address functional/ADL transfers PT goals addressed during session: Mobility/safety with mobility;Balance;Proper use of DME OT goals addressed during session: ADL's and self-care       AM-PAC PT "6 Clicks" Mobility  Outcome Measure Help needed turning from your back to your side while in a flat bed without using bedrails?: A Lot Help needed  moving from lying on your back to sitting on the side of a flat bed without using bedrails?: A Lot Help needed moving to and from a bed to a chair (including a wheelchair)?: A Lot Help needed standing up from a chair using your arms (e.g., wheelchair or bedside chair)?: A Lot Help needed to walk in hospital room?: A Lot Help needed climbing 3-5 steps with a railing? : Total 6 Click Score: 11    End of Session Equipment Utilized During Treatment: Gait belt Activity Tolerance: Patient limited by pain Patient left: in bed;with call bell/phone within reach;with nursing/sitter in room;with family/visitor present (on stretcher in ED.) Nurse Communication: Mobility status PT Visit Diagnosis: Unsteadiness on feet (R26.81);Muscle weakness (generalized) (M62.81);History of falling (Z91.81);Repeated falls (R29.6)    Time: 5643-3295 PT Time Calculation (min) (ACUTE ONLY): 29 min   Charges:   PT Evaluation $PT Eval Moderate Complexity: 1 Mod          Farley Ly, PT, DPT  Acute Rehabilitation Services  Pager: [phone] Office: [phone]   Lehman Prom 10/13/2020, 10:56 AM

## 2020-10-13 NOTE — ED Notes (Signed)
The following note sent to MD [facility]: this patient is awake and has been seen by PT and OT. She is alert enough to get up to bedside commode with walker and 2 assist. she is weak,. but I think meets the criteria to get in the car and get to the house. she will have 3 family members there to meet them to get her into the home. Is she ok to sign out at this time?

## 2020-10-13 NOTE — ED Notes (Signed)
DC teaching done with patient and her daughter. Patient is calm, alert, and no resp distress noted. She is able to ambulate with assist to [facility] and the w/c. All belongings sent home with patient's daughter and her fentanyl patch noted on her back with tegaderm dressing. PIV d/c and pressure dressing applied. No questions at this time.

## 2020-10-13 NOTE — ED Notes (Signed)
Echo cancelled per nurse. 

## 2020-10-13 NOTE — Discharge Summary (Signed)
Physician Discharge Summary  AMARYS SLIWINSKI YKD:[id number] DOB: 1923/08/21  PCP: Richmond Campbell., PA-C  Admitted from: Home Discharged to: Home  Admit date: 10/12/2020 Discharge date: 10/13/2020  Recommendations for Outpatient Follow-up:    Follow-up Information    Richmond Campbell., PA-C. Schedule an appointment as soon as possible for a visit in 1 week(s).   Specialty: Family Medicine Contact information: [address] [place] [address] [phone]        Quintella Reichert, MD .   Specialty: Cardiology Contact information: [phone] [address] [place] [address] [phone]                Home Health:  Home Health Orders (From admission, onward)    Start     Ordered   10/13/20 1112  Home Health  At discharge       Question Answer Comment  To provide the following care/treatments PT   To provide the following care/treatments OT      10/13/20 1115           Equipment/Devices: None    Discharge Condition: Improved and stable.   Code Status: DNR Diet recommendation:  Discharge Diet Orders (From admission, onward)    Start     Ordered   10/13/20 0000  Diet - low sodium heart healthy       Comments: Diet consistency: Dysphagia 3 diet and thin liquids.   10/13/20 1115           Discharge Diagnoses:  Principal Problem:   Altered mental status, unspecified Active Problems:   Osteoarthritis   Osteoporosis   Chronic diastolic CHF (congestive heart failure) (HCC)   Diabetes mellitus type 2, controlled (HCC)   Chronic lower back pain   Brief Summary: 86 year old female, lives alone but has 24/7 caregiver assistance, ambulates with the help of walker, PMH of progressive dementia, previous CVA, chronic low back pain on chronic narcotics, awaiting spinal stimulator implantation Friday of next week, osteoporosis and depression, presented to the ED on 10/12/2020 due to altered mental status, somnolent and difficult to arouse and  questionable right facial droop which daughter denies today.  Please see below for HPI per Dr. Luberta Robertson:  "Stephanie Jenkins is an 86 y.o. female with PMH significant for dementia, previous CVA, chronic lower back pain on chronic narcotic, osteoporosis and depression who lives with her daughter and is usually mobile was found to be unarousable by her daughter today.  Patient's daughter provides the entire history and notes that at baseline her mother gets out of bed and sits in the recliner and is able to go to the bathroom with assistance.  She usually gets her mother out of the house several times a week for drive or to walk around outside a little bit.  Over the past week patient apparently has been less energetic than usual.  Usually she looks forward to getting out of the house but the last week she has declined and said she was too tired to go.  This morning patient was very somnolent and was very difficult to arouse.  She apparently would not open her eyes but would just say "leave me alone".  Prior to today patient had been having good p.o. intake and had been taking her medications regularly.  Patient's daughter states there is been no irregularity in her medication dosage.  Patient's daughter states she has not had any fevers chills or malaise.  She does not have constipation.  She  is not had any diarrhea or nausea or vomiting.  She has not complained of abdominal pain and has had good p.o. intake.  She does think that she noticed a right facial droop that was very subtle earlier.  ED Course:  The patient was noted to be afebrile with normal vital signs.  She was noted to have a slight right facial droop and was very lethargic.  Laboratory data were unrevealing.  Urine was negative.  X-ray was negative. Head CT was without any intracranial abnormality.  She did complain of some abdominal pain at some point and CT abdomen and pelvis showed CBD at 1.2 cm with some sludge in the gallbladder.  Over  the course of the work-up, patient did gradually wake up and mental status has returned to baseline per patient's daughter.  Patient is now admitted for altered mental status.  Brain MRI and MRCP have been ordered.  Patient herself tells me "I am fine.  Any problem I have is from old age".  86 year old female presented with somnolence, altered mental status which had resolved by the time of ED arrival, very mild right facial droop, admitted for further evaluation and management.  Assessment and plan:  1. Altered mental status/acute encephalopathy of unclear etiology: CT and MRI brain without acute findings, specifically no acute stroke.  Daughter today verbalizes that she does not really think that patient had a facial droop.  No obvious toxic or metabolic causes of altered mental status found on evaluation.  This could have been a TIA versus progressive dementia versus related to her polypharmacy.  Daughter however indicates that there have been no significant change in her home medications except for increasing dose of Aricept from 10 mg to 23 mg daily almost 3 to 4 weeks ago but her mental status changes only started in the last few days.  Daughter reports that patient's mental status is currently back to baseline.  She does not wish to proceed with any further work-up of the TIA including echocardiogram, change in meds etc.  She was open to getting therapies evaluation but did not want SNF for short-term rehab if indicated either.  Therapies have evaluated her and recommend home health PT and OT.  Patient already on aspirin and Plavix PTA but daughter insists on holding the Plavix after last dose on 10/12/2020 due to upcoming spinal stimulator implantation appointment on Friday of next week.  She was made aware that holding Plavix will obviously increase the risk of TIA/stroke which she is well aware but at this time the main focus for patient and her family is to get patient comfortable from her  progressively worsening back pain and her daughter indicated to me in the presence of patient's ED RN nurse that they are willing to take the risk of the stroke.  Based on above discussion, canceled 2D echo ordered.  Continue prior home dose of aspirin and resume Plavix when able post spinal stimulator implantation.  A1c: 6.4 and at goal.  LDL 150, did not initiate statins due to very advanced age, frail physical status, progressive dementia and risks outweigh benefits.  Speech therapy evaluated and recommended modified diet. 2. Abnormal CT with common bile duct 1.5 cm: Although some tenderness noted in right upper quadrant bilaterally to deep palpation on admission, this has resolved.  Murphy sign negative.  Daughter reports that patient has had intermittent and chronic similar abdominal pain for some time.  She has had some "small intestine inflammation" in the past and is  on PPI for same.  MRCP shows borderline prominent CBD at 10 mm in diameter without large filling defect in the CBD and possible small gallstones versus sludge.  In the absence of acute symptoms or findings, no further evaluation especially given her and family's focus mostly on comfort and avoiding aggressive interventions. 3. Progressive chronic low back pain: This is her main concern at this time and patient and family working on spinal cord stimulator along with her pain management MD who is in [place], [place].  Continue prior home medication regimen which were not recently changed 4. Progressive dementia complicated by anxiety and depression/adult failure to thrive: Discussed extensively with daughter and advised her that she may want to consider hospice referral through PCP.  She did not seem ready yet for this.  They wish to continue to care for her as long as possible and when they are unable to, may look for placement. 5. Chronic diastolic CHF: Compensated.  Continue home dose of Lasix. 6. GERD: Continue PPI. 7. Macrocytic anemia:  Stable.  Unclear etiology. 8. Stage IIIb chronic kidney disease: Appears to be at baseline.  Consultations:  None  Procedures:  None   Discharge Instructions  Discharge Instructions    Call MD for:   Complete by: As directed    Recurrent strokelike symptoms or altered mental status.   Call MD for:  difficulty breathing, headache or visual disturbances   Complete by: As directed    Call MD for:  persistant dizziness or light-headedness   Complete by: As directed    Call MD for:  persistant nausea and vomiting   Complete by: As directed    Call MD for:  severe uncontrolled pain   Complete by: As directed    Call MD for:  temperature >100.4   Complete by: As directed    Diet - low sodium heart healthy   Complete by: As directed    Diet consistency: Dysphagia 3 diet and thin liquids.   Increase activity slowly   Complete by: As directed        Medication List    TAKE these medications   ALLERGY PO Take 1 tablet by mouth daily.   aspirin 81 MG tablet Take 162 mg by mouth at bedtime.   cholecalciferol 10 MCG (400 UNIT) Tabs tablet Commonly known as: VITAMIN D3 Take 800 Units by mouth daily.   clopidogrel 75 MG tablet Commonly known as: PLAVIX Take 1 tablet (75 mg total) by mouth daily. As per extensive discussion with daughter, remains on hold after 10/12/2020 dose for upcoming spinal stimulator implantation Friday of next week.  Resume thereafter when appropriate. What changed: additional instructions   donepezil 23 MG Tabs tablet Commonly known as: ARICEPT Take 23 mg by mouth at bedtime.   fentaNYL 25 MCG/HR Commonly known as: DURAGESIC Place 1 patch onto the skin every 3 (three) days.   furosemide 20 MG tablet Commonly known as: LASIX Take 1 tablet by mouth once daily   GLUCOSAMINE CHONDR 1500 COMPLX PO Take 1 tablet by mouth 2 (two) times daily.   IRON PO Take 1 tablet by mouth daily.   methocarbamol 500 MG tablet Commonly known as: ROBAXIN Take  1 tablet (500 mg total) by mouth 2 (two) times daily.   multivitamin tablet Take 1 tablet by mouth daily.   oxyCODONE-acetaminophen 5-325 MG tablet Commonly known as: PERCOCET/ROXICET Take 1-2 tablets by mouth See admin instructions. Take 2 tablets by mouth in am and 1 tablet at bedtime. May  take one additional tablet if pain increases.   pantoprazole 20 MG tablet Commonly known as: PROTONIX Take 1 tablet (20 mg total) by mouth daily for 14 days.   polyethylene glycol 17 g packet Commonly known as: MIRALAX / GLYCOLAX Take 17 g by mouth daily.   sertraline 50 MG tablet Commonly known as: ZOLOFT Take 75 mg by mouth daily.   SUPER B COMPLEX PO Take 1 tablet by mouth daily.   TURMERIC PO Take 1 capsule by mouth daily.   VITAMIN C PO Take 1 tablet by mouth daily.      Allergies  Allergen Reactions  . Hydrocodone-Acetaminophen     Other reaction(s): Other (See Comments) Talking out of her head.  . Hydrocodone-Acetaminophen Other (See Comments)    Other reaction(s): Other (See Comments) Talking out of her head. Talking out of her head.  . Oxycodone     Other reaction(s): Other (See Comments) Talks out of her head      Procedures/Studies: CT ABDOMEN PELVIS WO CONTRAST  Result Date: 10/12/2020 CLINICAL DATA:  Abdominal pain EXAM: CT ABDOMEN AND PELVIS WITHOUT CONTRAST TECHNIQUE: Multidetector CT imaging of the abdomen and pelvis was performed following the standard protocol without IV contrast. COMPARISON:  10/31/2018 FINDINGS: Lower chest: No acute abnormality. Bandlike scarring of the right lung base. Dense mitral annulus calcifications. Hepatobiliary: No solid liver abnormality is seen. Minimal sludge in the dependent gallbladder. No discrete gallstones. No gallbladder wall thickening. Intra and extrahepatic biliary ductal dilatation, the central common bile duct measuring up to 1.2 cm in caliber, this appearance new compared to prior examination dated 2019. Pancreas:  Unremarkable. Unchanged prominence of the pancreatic duct. No surrounding inflammatory changes. Spleen: Normal in size without significant abnormality. Adrenals/Urinary Tract: Adrenal glands are unremarkable. Kidneys are normal, without renal calculi, solid lesion, or hydronephrosis. Bladder is unremarkable. Stomach/Bowel: Stomach is within normal limits. Appendix is not clearly visualized. No evidence of bowel wall thickening, distention, or inflammatory changes. Vascular/Lymphatic: Aortic atherosclerosis. No enlarged abdominal or pelvic lymph nodes. Reproductive: Status post hysterectomy. Other: No abdominal wall hernia or abnormality. No abdominopelvic ascites. Musculoskeletal: No acute or significant osseous findings. IMPRESSION: Intra- and extrahepatic biliary ductal dilatation, the central common bile duct measuring up to 1.2 cm in caliber, this appearance new compared to prior examination dated 2019. There is sludge in the gallbladder without discrete gallstones or gallbladder wall thickening, nor obstructing calculus identified in the common bile duct. Correlate with clinical and laboratory evidence of biliary obstruction and consider MRCP or HIDA to further evaluate. Aortic Atherosclerosis (ICD10-I70.0). Electronically Signed   By: Lauralyn Primes M.D.   On: 10/12/2020 16:02   CT HEAD WO CONTRAST  Result Date: 10/12/2020 CLINICAL DATA:  Delirium with facial droop EXAM: CT HEAD WITHOUT CONTRAST TECHNIQUE: Contiguous axial images were obtained from the base of the skull through the vertex without intravenous contrast. COMPARISON:  03/16/2019 FINDINGS: Brain: No acute infarct or intracranial hemorrhage. Calcified right tentorial focus may reflect a small meningioma. No midline shift, ventriculomegaly or extra-axial fluid collection. Moderate cerebral atrophy with ex vacuo dilatation. Chronic microvascular ischemic changes. Vascular: No hyperdense vessel or unexpected calcification. Bilateral skull base  atherosclerotic calcifications. Skull: Negative for fracture or focal lesion. Sinuses/Orbits: No acute finding. Pneumatized paranasal sinuses and mastoid air cells. Other: None. IMPRESSION: 1. No acute intracranial abnormality. 2. Moderate cerebral atrophy and chronic microvascular ischemic changes. Electronically Signed   By: Stana Bunting M.D.   On: 10/12/2020 12:43   MR BRAIN WO CONTRAST  Result  Date: 10/12/2020 CLINICAL DATA:  86 year old female with generalized weakness, facial droop, neurologic deficit. EXAM: MRI HEAD WITHOUT CONTRAST TECHNIQUE: Multiplanar, multiecho pulse sequences of the brain and surrounding structures were obtained without intravenous contrast. COMPARISON:  Head CT earlier today.  Brain MRI 03/16/2019. FINDINGS: Brain: Stable cerebral volume from last year. No restricted diffusion to suggest acute infarction. No midline shift, mass effect, evidence of mass lesion, ventriculomegaly, extra-axial collection or acute intracranial hemorrhage. Cervicomedullary junction and pituitary are within normal limits. Incidental prominent dural calcification along the right tentorium. Wallace CullensGray and white matter signal throughout the brain is stable, and largely normal for age. However, there is chronically oasis or encephalomalacia in the medullary pyramids (series 11, image 5 and series 9, image 5). Furthermore there is chronic disproportionate atrophy of the bilateral mesial temporal lobes with some associated mild FLAIR hyperintensity (series 9, image 9). No chronic cerebral blood products or other encephalomalacia identified. Vascular: Major intracranial vascular flow voids are stable since 2020. Skull and upper cervical spine: Negative for age visible cervical spine, bone marrow signal. Sinuses/Orbits: Chronic Disconjugate gaze. Paranasal sinuses remain negative. Other: Mastoids remain clear. IMPRESSION: 1. No acute intracranial abnormality. 2. 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 Chronic disproportionate atrophy of both mesial temporal lobes. Electronically Signed   By: Odessa FlemingH  Hall M.D.   On: 10/12/2020 19:16   MR ABDOMEN MRCP WO CONTRAST  Result Date: 10/12/2020 CLINICAL DATA:  Abdominal pain with biliary dilatation on recent CT EXAM: MRI ABDOMEN WITHOUT CONTRAST  (INCLUDING MRCP) TECHNIQUE: Multiplanar multisequence MR imaging of the abdomen was performed. Heavily T2-weighted images of the biliary and pancreatic ducts were obtained, and three-dimensional MRCP images were rendered by post processing. COMPARISON:  CT abdomen 10/12/2020 FINDINGS: Despite efforts by the technologist and patient, severe motion artifact is present on today's exam and could not be eliminated. This reduces exam sensitivity and specificity. Lower chest: Unremarkable Hepatobiliary: Borderline prominence of the common bile duct at 10 mm in diameter no obvious or large filling defect is identified in the CBD, which in general seems to taper in a conical expected fashion in the vicinity of the ampulla, although the severe motion artifact blurs the common bile duct substantially. Dependent low T2 signal in the gallbladder is present and could represent sludge or small gallstones. Subject to the limits of the severe motion artifact, I not see a significant focal lesion in the liver Pancreas:  Mild chronic prominence of the dorsal pancreatic duct. Spleen:  Unremarkable Adrenals/Urinary Tract:  Unremarkable Stomach/Bowel: Unremarkable Vascular/Lymphatic:  Aortoiliac atherosclerotic vascular disease. Other:  No supplemental non-categorized findings. Musculoskeletal: Dextroconvex lumbar scoliosis with rotary component. Lumbar spondylosis and degenerative disc disease likely causing multilevel impingement. IMPRESSION: 1. Borderline prominence of the common bile duct at 10 mm in diameter, which seems to taper in a conical expected fashion in the vicinity of the ampulla. No  obvious or large filling defect is identified in the CBD, although the severe motion artifact blurs the common bile duct substantially, resulting in reduced sensitivity and specificity. 2. Dependent low T2 signal in the gallbladder could represent sludge or small gallstones. 3. Dextroconvex lumbar scoliosis with rotary component. Lumbar spondylosis and degenerative disc disease likely causing multilevel impingement. 4.  Aortic Atherosclerosis (ICD10-I70.0). 5. Mild chronic prominence of the dorsal pancreatic duct. Electronically Signed   By: Gaylyn RongWalter  Liebkemann M.D.   On: 10/12/2020 18:57   DG Chest Port 1 View  Result Date: 10/12/2020 CLINICAL DATA:  Altered mental status, weakness.  EXAM: PORTABLE CHEST 1 VIEW COMPARISON:  Prior chest radiographs 07/20/2018 and earlier. FINDINGS: Overlying cardiac monitoring leads. Heart size within normal limits. Aortic atherosclerosis. No appreciable airspace consolidation or pulmonary edema. No evidence of pleural effusion or pneumothorax. No acute bony abnormality identified. Thoracic spondylosis. IMPRESSION: No evidence of acute cardiopulmonary abnormality. Aortic Atherosclerosis (ICD10-I70.0). Electronically Signed   By: Jackey Loge DO   On: 10/12/2020 10:45       Subjective: I interviewed patient in her ED room along with daughter and patient's female RN at bedside.  Patient sleeping.  As per discussion with daughter, expressed frustration being in the ED for the last 24 hours and really wants to be discharged.  She indicates that patient's mental status is back to baseline.  There overarching focus is on improving her comfort from progressive back pain and getting the upcoming procedure done.  Discharge Exam:  Vitals:   10/13/20 1000 10/13/20 1015 10/13/20 1030 10/13/20 1045  BP: 131/89 (!) 143/64 131/68 132/63  Pulse: 91 69 69 66  Resp: (!) Temp:      TempSrc:      SpO2: 99% 97% 97% 93%  Weight:      Height:        General: Pleasant  elderly female, moderately built and frail lying comfortably propped up in bed without distress.  Sleeping but easily arousable, not oriented. Cardiovascular: S1 & S2 heard, RRR, S1/S2 +. No murmurs, rubs, gallops or clicks. No JVD or pedal edema.  Telemetry personally reviewed: Sinus rhythm. Respiratory: Clear to auscultation without wheezing, rhonchi or crackles. No increased work of breathing. Abdominal:  Non distended, non tender & soft. No organomegaly or masses appreciated. Normal bowel sounds heard. CNS: Mental status as noted above. No focal deficits. Extremities: no edema, no cyanosis    The results of significant diagnostics from this hospitalization (including imaging, microbiology, ancillary and laboratory) are listed below for reference.     Microbiology: Recent Results (from the past 240 hour(s))  Resp Panel by RT-PCR (Flu A&B, Covid) Nasopharyngeal Swab     Status: None   Collection Time: 10/12/20 10:39 AM   Specimen: Nasopharyngeal Swab; Nasopharyngeal(NP) swabs in vial transport medium  Result Value Ref Range Status   SARS Coronavirus 2 by RT PCR NEGATIVE NEGATIVE Final    Comment: (NOTE) SARS-CoV-2 target nucleic acids are NOT DETECTED.  The SARS-CoV-2 RNA is generally detectable in upper respiratory specimens during the acute phase of infection. The lowest concentration of SARS-CoV-2 viral copies this assay can detect is 138 copies/mL. A negative result does not preclude SARS-Cov-2 infection and should not be used as the sole basis for treatment or other patient management decisions. A negative result may occur with  improper specimen collection/handling, submission of specimen other than nasopharyngeal swab, presence of viral mutation(s) within the areas targeted by this assay, and inadequate number of viral copies(<138 copies/mL). A negative result must be combined with clinical observations, patient history, and epidemiological information. The expected result  is Negative.  Fact Sheet for Patients:  BloggerCourse.com  Fact Sheet for Healthcare Providers:  SeriousBroker.it  This test is no t yet approved or cleared by the [place] FDA and  has been authorized for detection and/or diagnosis of SARS-CoV-2 by FDA under an Emergency Use Authorization (EUA). This EUA will remain  in effect (meaning this test can be used) for the duration of the COVID-19 declaration under Section 564(b)(1) of the Act, 21 U.S.C.section 360bbb-3(b)(1), unless the authorization is  terminated  or revoked sooner.       Influenza A by PCR NEGATIVE NEGATIVE Final   Influenza B by PCR NEGATIVE NEGATIVE Final    Comment: (NOTE) The Xpert Xpress SARS-CoV-2/FLU/RSV plus assay is intended as an aid in the diagnosis of influenza from Nasopharyngeal swab specimens and should not be used as a sole basis for treatment. Nasal washings and aspirates are unacceptable for Xpert Xpress SARS-CoV-2/FLU/RSV testing.  Fact Sheet for Patients: BloggerCourse.com  Fact Sheet for Healthcare Providers: SeriousBroker.it  This test is not yet approved or cleared by the [place] FDA and has been authorized for detection and/or diagnosis of SARS-CoV-2 by FDA under an Emergency Use Authorization (EUA). This EUA will remain in effect (meaning this test can be used) for the duration of the COVID-19 declaration under Section 564(b)(1) of the Act, 21 U.S.C. section 360bbb-3(b)(1), unless the authorization is terminated or revoked.  Performed at [facility], [address], [place], [place] [address]      Labs: CBC: Recent Labs  Lab 10/12/20 1026  WBC 8.4  NEUTROABS 5.0  HGB 11.5*  HCT 36.7  MCV 103.4*  PLT 251    Basic Metabolic Panel: Recent Labs  Lab 10/12/20 1026  NA 138  K 4.3  CL 102  CO2 24  GLUCOSE 115*  BUN 18  CREATININE 1.47*  CALCIUM 9.3     Liver Function Tests: Recent Labs  Lab 10/12/20 1026  AST 21  ALT 14  ALKPHOS 59  BILITOT 0.7  PROT 6.5  ALBUMIN 3.9    CBG: Recent Labs  Lab 10/12/20 1010  GLUCAP 99    Hgb A1c Recent Labs    10/13/20 0408  HGBA1C 6.4*    Lipid Profile Recent Labs    10/13/20 0408  CHOL 225*  HDL 54  LDLCALC 150*  TRIG 104  CHOLHDL 4.2    Urinalysis    Component Value Date/Time   COLORURINE YELLOW 10/12/2020 1056   APPEARANCEUR CLEAR 10/12/2020 1056   LABSPEC 1.017 10/12/2020 1056   PHURINE 5.0 10/12/2020 1056   GLUCOSEU NEGATIVE 10/12/2020 1056   HGBUR NEGATIVE 10/12/2020 1056   HGBUR 2+ 07/27/2007 0000   BILIRUBINUR NEGATIVE 10/12/2020 1056   BILIRUBINUR neg 05/02/2017 1110   KETONESUR NEGATIVE 10/12/2020 1056   PROTEINUR NEGATIVE 10/12/2020 1056   UROBILINOGEN 0.2 05/02/2017 1110   UROBILINOGEN 0.2 07/27/2007 0000   NITRITE NEGATIVE 10/12/2020 1056   LEUKOCYTESUR NEGATIVE 10/12/2020 1056      Time coordinating discharge: 35 minutes  SIGNED:  Marcellus Scott, MD, FACP, [facility]. [facility]  To contact the attending provider between 7A-7P or the covering provider during after hours 7P-7A, please log into the web site [email or web address] and access using universal [place] password for that web site. If you do not have the password, please call the hospital operator.

## 2020-10-13 NOTE — Care Management CC44 (Signed)
Condition Code 44 Documentation Completed  Patient Details  Name: VYLA PINT MRN: [id number] Date of Birth: 15-Aug-1923   Condition Code 44 given:  Yes Patient signature on Condition Code 44 notice:  Yes Documentation of 2 MD's agreement:  Yes Code 44 added to claim:  Yes    Oletta Cohn, RN 10/13/2020, 12:04 PM

## 2020-10-13 NOTE — Care Management Obs Status (Signed)
MEDICARE OBSERVATION STATUS NOTIFICATION   Patient Details  Name: Stephanie Jenkins MRN: [id number] Date of Birth: 11/01/1923   Medicare Observation Status Notification Given:  Yes    Oletta Cohn, RN 10/13/2020, 12:04 PM

## 2020-10-13 NOTE — Progress Notes (Signed)
Pt evaluated with OT. PT requiring mod A +2 for mobility tasks using RW. Pt's family reports they do not want SNF and plan to take pt home. Educated about need for +2 assist at home and pt's daughter reports they can provide. Recommending HHPT and 24/7 supervision at d/c. Formal note to follow.   Farley Ly, PT, DPT  Acute Rehabilitation Services  Pager: [phone] Office: [phone]

## 2020-10-13 NOTE — Discharge Instructions (Signed)

## 2020-10-13 NOTE — Discharge Planning (Signed)
RNCM unable to secure Home Health services for pt as Home Health Agencies accepting pt insurance can not accept referrals until next week.

## 2020-10-13 NOTE — ED Notes (Signed)
Lunch Tray Ordered @ 1050. 

## 2020-10-20 DIAGNOSIS — E7800 Pure hypercholesterolemia, unspecified: Secondary | ICD-10-CM | POA: Diagnosis not present

## 2020-10-20 DIAGNOSIS — M48061 Spinal stenosis, lumbar region without neurogenic claudication: Secondary | ICD-10-CM | POA: Diagnosis not present

## 2020-10-20 DIAGNOSIS — M5416 Radiculopathy, lumbar region: Secondary | ICD-10-CM | POA: Diagnosis not present

## 2020-10-20 DIAGNOSIS — G8929 Other chronic pain: Secondary | ICD-10-CM | POA: Diagnosis not present

## 2020-10-20 DIAGNOSIS — Z9682 Presence of neurostimulator: Secondary | ICD-10-CM | POA: Diagnosis not present

## 2020-10-26 DIAGNOSIS — M5136 Other intervertebral disc degeneration, lumbar region: Secondary | ICD-10-CM | POA: Diagnosis not present

## 2020-10-26 DIAGNOSIS — M4150 Other secondary scoliosis, site unspecified: Secondary | ICD-10-CM | POA: Diagnosis not present

## 2020-10-27 ENCOUNTER — Other Ambulatory Visit: Payer: Self-pay | Admitting: Cardiology

## 2020-11-01 DIAGNOSIS — Z4542 Encounter for adjustment and management of neuropacemaker (brain) (peripheral nerve) (spinal cord): Secondary | ICD-10-CM | POA: Diagnosis not present

## 2020-11-01 DIAGNOSIS — Z9689 Presence of other specified functional implants: Secondary | ICD-10-CM | POA: Diagnosis not present

## 2020-11-01 DIAGNOSIS — Z48811 Encounter for surgical aftercare following surgery on the nervous system: Secondary | ICD-10-CM | POA: Diagnosis not present

## 2020-11-01 DIAGNOSIS — M5136 Other intervertebral disc degeneration, lumbar region: Secondary | ICD-10-CM | POA: Diagnosis not present

## 2020-11-17 DIAGNOSIS — Z462 Encounter for fitting and adjustment of other devices related to nervous system and special senses: Secondary | ICD-10-CM | POA: Diagnosis not present

## 2020-11-17 DIAGNOSIS — Z9689 Presence of other specified functional implants: Secondary | ICD-10-CM | POA: Diagnosis not present

## 2020-11-17 DIAGNOSIS — M5136 Other intervertebral disc degeneration, lumbar region: Secondary | ICD-10-CM | POA: Diagnosis not present

## 2020-11-23 DIAGNOSIS — M4150 Other secondary scoliosis, site unspecified: Secondary | ICD-10-CM | POA: Diagnosis not present

## 2020-11-23 DIAGNOSIS — M5136 Other intervertebral disc degeneration, lumbar region: Secondary | ICD-10-CM | POA: Diagnosis not present

## 2020-11-27 ENCOUNTER — Other Ambulatory Visit: Payer: Self-pay | Admitting: Cardiology

## 2020-12-29 DIAGNOSIS — M4150 Other secondary scoliosis, site unspecified: Secondary | ICD-10-CM | POA: Diagnosis not present

## 2020-12-29 DIAGNOSIS — M5136 Other intervertebral disc degeneration, lumbar region: Secondary | ICD-10-CM | POA: Diagnosis not present

## 2021-01-22 DIAGNOSIS — R059 Cough, unspecified: Secondary | ICD-10-CM | POA: Diagnosis not present

## 2021-02-09 DIAGNOSIS — M5136 Other intervertebral disc degeneration, lumbar region: Secondary | ICD-10-CM | POA: Diagnosis not present

## 2021-02-09 DIAGNOSIS — M4150 Other secondary scoliosis, site unspecified: Secondary | ICD-10-CM | POA: Diagnosis not present

## 2021-03-05 DIAGNOSIS — R399 Unspecified symptoms and signs involving the genitourinary system: Secondary | ICD-10-CM | POA: Diagnosis not present

## 2021-03-12 DIAGNOSIS — G8929 Other chronic pain: Secondary | ICD-10-CM | POA: Diagnosis not present

## 2021-03-12 DIAGNOSIS — K219 Gastro-esophageal reflux disease without esophagitis: Secondary | ICD-10-CM | POA: Diagnosis not present

## 2021-03-12 DIAGNOSIS — N1832 Chronic kidney disease, stage 3b: Secondary | ICD-10-CM | POA: Diagnosis not present

## 2021-03-12 DIAGNOSIS — I5032 Chronic diastolic (congestive) heart failure: Secondary | ICD-10-CM | POA: Diagnosis not present

## 2021-03-12 DIAGNOSIS — F3341 Major depressive disorder, recurrent, in partial remission: Secondary | ICD-10-CM | POA: Diagnosis not present

## 2021-03-12 DIAGNOSIS — M545 Low back pain, unspecified: Secondary | ICD-10-CM | POA: Diagnosis not present

## 2021-03-12 DIAGNOSIS — R739 Hyperglycemia, unspecified: Secondary | ICD-10-CM | POA: Diagnosis not present

## 2021-04-18 DIAGNOSIS — Z23 Encounter for immunization: Secondary | ICD-10-CM | POA: Diagnosis not present

## 2021-05-02 DIAGNOSIS — M4150 Other secondary scoliosis, site unspecified: Secondary | ICD-10-CM | POA: Diagnosis not present

## 2021-05-02 DIAGNOSIS — M5136 Other intervertebral disc degeneration, lumbar region: Secondary | ICD-10-CM | POA: Diagnosis not present

## 2021-09-17 DIAGNOSIS — G8929 Other chronic pain: Secondary | ICD-10-CM | POA: Diagnosis not present

## 2021-09-17 DIAGNOSIS — Z0000 Encounter for general adult medical examination without abnormal findings: Secondary | ICD-10-CM | POA: Diagnosis not present

## 2021-09-17 DIAGNOSIS — M545 Low back pain, unspecified: Secondary | ICD-10-CM | POA: Diagnosis not present

## 2021-09-17 DIAGNOSIS — R152 Fecal urgency: Secondary | ICD-10-CM | POA: Diagnosis not present

## 2021-09-17 DIAGNOSIS — R739 Hyperglycemia, unspecified: Secondary | ICD-10-CM | POA: Diagnosis not present

## 2021-09-17 DIAGNOSIS — I5032 Chronic diastolic (congestive) heart failure: Secondary | ICD-10-CM | POA: Diagnosis not present

## 2021-09-17 DIAGNOSIS — N1832 Chronic kidney disease, stage 3b: Secondary | ICD-10-CM | POA: Diagnosis not present

## 2021-09-17 DIAGNOSIS — F3341 Major depressive disorder, recurrent, in partial remission: Secondary | ICD-10-CM | POA: Diagnosis not present

## 2022-12-05 DEATH — deceased
